# Patient Record
Sex: Male | Born: 1955 | Race: Black or African American | Hispanic: No | Marital: Married
Health system: Midwestern US, Community
[De-identification: ages and names within clinical notes are randomized; demographics above are authoritative.]

## PROBLEM LIST (undated history)

## (undated) DIAGNOSIS — C220 Liver cell carcinoma: Secondary | ICD-10-CM

## (undated) DIAGNOSIS — N400 Enlarged prostate without lower urinary tract symptoms: Secondary | ICD-10-CM

## (undated) DIAGNOSIS — Z862 Personal history of diseases of the blood and blood-forming organs and certain disorders involving the immune mechanism: Secondary | ICD-10-CM

## (undated) DIAGNOSIS — I10 Essential (primary) hypertension: Secondary | ICD-10-CM

## (undated) DIAGNOSIS — K7460 Unspecified cirrhosis of liver: Secondary | ICD-10-CM

## (undated) DIAGNOSIS — G4733 Obstructive sleep apnea (adult) (pediatric): Principal | ICD-10-CM

## (undated) DIAGNOSIS — H209 Unspecified iridocyclitis: Secondary | ICD-10-CM

## (undated) DIAGNOSIS — E785 Hyperlipidemia, unspecified: Secondary | ICD-10-CM

## (undated) DIAGNOSIS — B182 Chronic viral hepatitis C: Secondary | ICD-10-CM

## (undated) HISTORY — DX: Unspecified iridocyclitis: H20.9

## (undated) HISTORY — DX: Benign prostatic hyperplasia without lower urinary tract symptoms: N40.0

## (undated) HISTORY — DX: Chronic viral hepatitis C: B18.2

## (undated) HISTORY — DX: Unspecified cirrhosis of liver: K74.60

## (undated) HISTORY — DX: Liver cell carcinoma: C22.0

## (undated) HISTORY — DX: Obstructive sleep apnea (adult) (pediatric): G47.33

## (undated) HISTORY — DX: Essential (primary) hypertension: I10

## (undated) HISTORY — DX: Hyperlipidemia, unspecified: E78.5

## (undated) HISTORY — DX: Personal history of diseases of the blood and blood-forming organs and certain disorders involving the immune mechanism: Z86.2

---

## 1998-06-05 HISTORY — PX: OTHER SURGICAL HISTORY: SHX169

## 2005-03-21 ENCOUNTER — Ambulatory Visit: Payer: Self-pay | Admitting: Internal Medicine

## 2005-04-11 ENCOUNTER — Ambulatory Visit: Payer: Self-pay | Admitting: Internal Medicine

## 2005-06-12 ENCOUNTER — Ambulatory Visit: Payer: Self-pay | Admitting: Internal Medicine

## 2005-06-14 ENCOUNTER — Ambulatory Visit: Payer: Self-pay | Admitting: Internal Medicine

## 2005-06-22 ENCOUNTER — Ambulatory Visit: Payer: Self-pay | Admitting: Internal Medicine

## 2005-07-21 ENCOUNTER — Ambulatory Visit: Payer: Self-pay | Admitting: Internal Medicine

## 2006-03-21 ENCOUNTER — Ambulatory Visit: Payer: Self-pay | Admitting: Internal Medicine

## 2006-03-21 LAB — CONVERTED CEMR LAB
ALT: 69 units/L — ABNORMAL HIGH (ref 0–40)
Albumin: 3.1 g/dL — ABNORMAL LOW (ref 3.5–5.2)
Alkaline Phosphatase: 47 units/L (ref 39–117)
BUN: 12 mg/dL (ref 6–23)
Calcium: 9 mg/dL (ref 8.4–10.5)
Chol/HDL Ratio, serum: 4
Creatinine, Ser: 1 mg/dL (ref 0.4–1.5)
Creatinine,U: 26.8 mg/dL
GFR calc non Af Amer: 84 mL/min
Glomerular Filtration Rate, Af Am: 101 mL/min/{1.73_m2}
LDL Cholesterol: 110 mg/dL — ABNORMAL HIGH (ref 0–99)
Microalb Creat Ratio: 145.5 mg/g — ABNORMAL HIGH (ref 0.0–30.0)
Total Bilirubin: 0.6 mg/dL (ref 0.3–1.2)
Total Protein: 8 g/dL (ref 6.0–8.3)
VLDL: 21 mg/dL (ref 0–40)

## 2006-06-15 ENCOUNTER — Ambulatory Visit: Payer: Self-pay | Admitting: Internal Medicine

## 2006-06-15 LAB — CONVERTED CEMR LAB
AST: 73 units/L — ABNORMAL HIGH (ref 0–37)
Albumin: 3.4 g/dL — ABNORMAL LOW (ref 3.5–5.2)
Alkaline Phosphatase: 48 units/L (ref 39–117)
HCT: 48.7 % (ref 39.0–52.0)
Hgb A1c MFr Bld: 6.8 % — ABNORMAL HIGH (ref 4.6–6.0)
RDW: 12.7 % (ref 11.5–14.6)
Total Bilirubin: 1.2 mg/dL (ref 0.3–1.2)
WBC: 5.2 10*3/uL (ref 4.5–10.5)

## 2006-08-02 ENCOUNTER — Ambulatory Visit: Payer: Self-pay | Admitting: Internal Medicine

## 2006-08-02 LAB — CONVERTED CEMR LAB
Calcium: 9.2 mg/dL (ref 8.4–10.5)
Chloride: 98 meq/L (ref 96–112)
Creatinine, Ser: 1 mg/dL (ref 0.4–1.5)
GFR calc non Af Amer: 83 mL/min
Glucose, Bld: 99 mg/dL (ref 70–99)
Sodium: 134 meq/L — ABNORMAL LOW (ref 135–145)

## 2006-08-14 ENCOUNTER — Ambulatory Visit: Payer: Self-pay | Admitting: Gastroenterology

## 2006-08-14 ENCOUNTER — Encounter: Payer: Self-pay | Admitting: Internal Medicine

## 2006-09-26 DIAGNOSIS — E119 Type 2 diabetes mellitus without complications: Secondary | ICD-10-CM | POA: Insufficient documentation

## 2006-09-26 DIAGNOSIS — E785 Hyperlipidemia, unspecified: Secondary | ICD-10-CM | POA: Insufficient documentation

## 2006-09-26 DIAGNOSIS — M810 Age-related osteoporosis without current pathological fracture: Secondary | ICD-10-CM | POA: Insufficient documentation

## 2006-09-26 DIAGNOSIS — I10 Essential (primary) hypertension: Secondary | ICD-10-CM | POA: Insufficient documentation

## 2006-09-26 DIAGNOSIS — D869 Sarcoidosis, unspecified: Secondary | ICD-10-CM | POA: Insufficient documentation

## 2007-04-12 ENCOUNTER — Encounter (INDEPENDENT_AMBULATORY_CARE_PROVIDER_SITE_OTHER): Payer: Self-pay | Admitting: *Deleted

## 2007-04-16 ENCOUNTER — Ambulatory Visit: Payer: Self-pay | Admitting: Internal Medicine

## 2007-04-16 DIAGNOSIS — H209 Unspecified iridocyclitis: Secondary | ICD-10-CM | POA: Insufficient documentation

## 2007-04-16 DIAGNOSIS — Z87442 Personal history of urinary calculi: Secondary | ICD-10-CM | POA: Insufficient documentation

## 2007-04-16 DIAGNOSIS — B1920 Unspecified viral hepatitis C without hepatic coma: Secondary | ICD-10-CM | POA: Insufficient documentation

## 2007-04-16 DIAGNOSIS — N400 Enlarged prostate without lower urinary tract symptoms: Secondary | ICD-10-CM | POA: Insufficient documentation

## 2007-04-16 LAB — CONVERTED CEMR LAB
Blood in Urine, dipstick: NEGATIVE
Glucose, Urine, Semiquant: NEGATIVE
Nitrite: NEGATIVE
Protein, U semiquant: 100
Urobilinogen, UA: NEGATIVE
WBC Urine, dipstick: NEGATIVE
pH: 6.5

## 2007-04-19 LAB — CONVERTED CEMR LAB
ALT: 75 units/L — ABNORMAL HIGH (ref 0–53)
AST: 73 units/L — ABNORMAL HIGH (ref 0–37)
Albumin: 3.7 g/dL (ref 3.5–5.2)
Alkaline Phosphatase: 35 units/L — ABNORMAL LOW (ref 39–117)
BUN: 12 mg/dL (ref 6–23)
Basophils Absolute: 0 10*3/uL (ref 0.0–0.1)
Calcium: 9.8 mg/dL (ref 8.4–10.5)
Chloride: 99 meq/L (ref 96–112)
Creatinine,U: 101.1 mg/dL
Eosinophils Absolute: 0.1 10*3/uL (ref 0.0–0.6)
GFR calc non Af Amer: 83 mL/min
HCT: 47 % (ref 39.0–52.0)
Hgb A1c MFr Bld: 6.8 % — ABNORMAL HIGH (ref 4.6–6.0)
LDL Cholesterol: 79 mg/dL (ref 0–99)
Microalb Creat Ratio: 72.2 mg/g — ABNORMAL HIGH (ref 0.0–30.0)
Platelets: 211 10*3/uL (ref 150–400)
RBC: 5.17 M/uL (ref 4.22–5.81)
Total CHOL/HDL Ratio: 2.9
Triglycerides: 91 mg/dL (ref 0–149)
WBC: 5.5 10*3/uL (ref 4.5–10.5)

## 2007-10-24 ENCOUNTER — Telehealth: Payer: Self-pay | Admitting: Internal Medicine

## 2007-10-24 ENCOUNTER — Emergency Department (HOSPITAL_COMMUNITY): Admission: EM | Admit: 2007-10-24 | Discharge: 2007-10-24 | Payer: Self-pay | Admitting: Emergency Medicine

## 2007-11-05 ENCOUNTER — Ambulatory Visit: Payer: Self-pay | Admitting: Internal Medicine

## 2007-11-12 ENCOUNTER — Ambulatory Visit: Payer: Self-pay | Admitting: Internal Medicine

## 2007-11-14 ENCOUNTER — Encounter (INDEPENDENT_AMBULATORY_CARE_PROVIDER_SITE_OTHER): Payer: Self-pay | Admitting: *Deleted

## 2007-11-14 LAB — CONVERTED CEMR LAB
ALT: 55 units/L — ABNORMAL HIGH (ref 0–53)
AST: 54 units/L — ABNORMAL HIGH (ref 0–37)
Albumin: 3.7 g/dL (ref 3.5–5.2)
Alkaline Phosphatase: 40 units/L (ref 39–117)
Total Protein: 8.5 g/dL — ABNORMAL HIGH (ref 6.0–8.3)

## 2007-12-03 ENCOUNTER — Ambulatory Visit: Payer: Self-pay | Admitting: Internal Medicine

## 2007-12-09 LAB — CONVERTED CEMR LAB
Calcium: 9.8 mg/dL (ref 8.4–10.5)
Creatinine, Ser: 0.9 mg/dL (ref 0.4–1.5)
GFR calc Af Amer: 114 mL/min
GFR calc non Af Amer: 94 mL/min
Potassium: 4.4 meq/L (ref 3.5–5.1)

## 2007-12-19 ENCOUNTER — Encounter (INDEPENDENT_AMBULATORY_CARE_PROVIDER_SITE_OTHER): Payer: Self-pay | Admitting: *Deleted

## 2008-03-23 ENCOUNTER — Telehealth (INDEPENDENT_AMBULATORY_CARE_PROVIDER_SITE_OTHER): Payer: Self-pay | Admitting: *Deleted

## 2008-04-09 ENCOUNTER — Ambulatory Visit: Payer: Self-pay | Admitting: Internal Medicine

## 2008-05-22 ENCOUNTER — Telehealth (INDEPENDENT_AMBULATORY_CARE_PROVIDER_SITE_OTHER): Payer: Self-pay | Admitting: *Deleted

## 2008-06-01 ENCOUNTER — Encounter (INDEPENDENT_AMBULATORY_CARE_PROVIDER_SITE_OTHER): Payer: Self-pay | Admitting: *Deleted

## 2008-06-23 ENCOUNTER — Ambulatory Visit: Payer: Self-pay | Admitting: Internal Medicine

## 2008-06-25 ENCOUNTER — Telehealth (INDEPENDENT_AMBULATORY_CARE_PROVIDER_SITE_OTHER): Payer: Self-pay | Admitting: *Deleted

## 2008-06-25 LAB — CONVERTED CEMR LAB
ALT: 86 units/L — ABNORMAL HIGH (ref 0–53)
AST: 66 units/L — ABNORMAL HIGH (ref 0–37)
BUN: 15 mg/dL (ref 6–23)
Calcium: 10.2 mg/dL (ref 8.4–10.5)
Chloride: 96 meq/L (ref 96–112)
Creatinine, Ser: 1 mg/dL (ref 0.4–1.5)
GFR calc non Af Amer: 83 mL/min
Hgb A1c MFr Bld: 7.6 % — ABNORMAL HIGH (ref 4.6–6.0)
Microalb Creat Ratio: 307.3 mg/g — ABNORMAL HIGH (ref 0.0–30.0)
Microalb, Ur: 6.3 mg/dL — ABNORMAL HIGH (ref 0.0–1.9)

## 2008-07-02 ENCOUNTER — Ambulatory Visit: Payer: Self-pay | Admitting: Internal Medicine

## 2008-07-02 ENCOUNTER — Encounter: Payer: Self-pay | Admitting: Internal Medicine

## 2008-07-21 ENCOUNTER — Telehealth (INDEPENDENT_AMBULATORY_CARE_PROVIDER_SITE_OTHER): Payer: Self-pay | Admitting: *Deleted

## 2008-07-30 ENCOUNTER — Encounter: Payer: Self-pay | Admitting: Internal Medicine

## 2008-08-06 ENCOUNTER — Encounter: Payer: Self-pay | Admitting: Internal Medicine

## 2008-09-29 ENCOUNTER — Ambulatory Visit: Payer: Self-pay | Admitting: Internal Medicine

## 2008-10-02 ENCOUNTER — Telehealth (INDEPENDENT_AMBULATORY_CARE_PROVIDER_SITE_OTHER): Payer: Self-pay | Admitting: *Deleted

## 2008-10-02 LAB — CONVERTED CEMR LAB
BUN: 17 mg/dL (ref 6–23)
Basophils Absolute: 0 10*3/uL (ref 0.0–0.1)
Chloride: 104 meq/L (ref 96–112)
Eosinophils Absolute: 0 10*3/uL (ref 0.0–0.7)
HCT: 46.4 % (ref 39.0–52.0)
Hemoglobin: 15.8 g/dL (ref 13.0–17.0)
Hgb A1c MFr Bld: 6.7 % — ABNORMAL HIGH (ref 4.6–6.5)
Lymphocytes Relative: 21.9 % (ref 12.0–46.0)
MCHC: 34.1 g/dL (ref 30.0–36.0)
Microalb Creat Ratio: 512.8 mg/g — ABNORMAL HIGH (ref 0.0–30.0)
Neutro Abs: 3.3 10*3/uL (ref 1.4–7.7)
Neutrophils Relative %: 67.2 % (ref 43.0–77.0)
Platelets: 192 10*3/uL (ref 150.0–400.0)
Potassium: 4.2 meq/L (ref 3.5–5.1)
RDW: 13.5 % (ref 11.5–14.6)
TSH: 1.14 microintl units/mL (ref 0.35–5.50)
Total CHOL/HDL Ratio: 4
Triglycerides: 73 mg/dL (ref 0.0–149.0)
WBC: 4.9 10*3/uL (ref 4.5–10.5)

## 2008-10-07 ENCOUNTER — Ambulatory Visit: Payer: Self-pay | Admitting: Gastroenterology

## 2008-10-09 ENCOUNTER — Encounter (INDEPENDENT_AMBULATORY_CARE_PROVIDER_SITE_OTHER): Payer: Self-pay | Admitting: *Deleted

## 2008-10-16 ENCOUNTER — Ambulatory Visit: Payer: Self-pay | Admitting: Gastroenterology

## 2008-10-16 LAB — HM COLONOSCOPY

## 2008-11-18 ENCOUNTER — Encounter: Payer: Self-pay | Admitting: Internal Medicine

## 2009-02-04 ENCOUNTER — Telehealth (INDEPENDENT_AMBULATORY_CARE_PROVIDER_SITE_OTHER): Payer: Self-pay | Admitting: *Deleted

## 2009-03-09 ENCOUNTER — Ambulatory Visit: Payer: Self-pay | Admitting: Internal Medicine

## 2009-03-12 ENCOUNTER — Ambulatory Visit: Payer: Self-pay | Admitting: Internal Medicine

## 2009-03-12 LAB — HM DIABETES FOOT EXAM

## 2009-03-16 ENCOUNTER — Encounter: Admission: RE | Admit: 2009-03-16 | Discharge: 2009-03-16 | Payer: Self-pay | Admitting: Internal Medicine

## 2009-03-16 ENCOUNTER — Encounter: Payer: Self-pay | Admitting: Internal Medicine

## 2009-04-02 ENCOUNTER — Ambulatory Visit: Payer: Self-pay | Admitting: Internal Medicine

## 2009-04-05 LAB — CONVERTED CEMR LAB
BUN: 24 mg/dL — ABNORMAL HIGH (ref 6–23)
Calcium: 9 mg/dL (ref 8.4–10.5)
Chloride: 99 meq/L (ref 96–112)
Creatinine, Ser: 1.1 mg/dL (ref 0.4–1.5)
GFR calc non Af Amer: 89.41 mL/min (ref >60)
Hgb A1c MFr Bld: 6.5 % (ref 4.6–6.5)
Microalb, Ur: 11.3 mg/dL — ABNORMAL HIGH (ref 0.0–1.9)

## 2009-04-09 ENCOUNTER — Encounter: Payer: Self-pay | Admitting: Internal Medicine

## 2009-04-22 ENCOUNTER — Encounter: Payer: Self-pay | Admitting: Internal Medicine

## 2009-04-22 ENCOUNTER — Ambulatory Visit: Payer: Self-pay | Admitting: Gastroenterology

## 2009-06-14 ENCOUNTER — Ambulatory Visit (HOSPITAL_COMMUNITY): Admission: RE | Admit: 2009-06-14 | Discharge: 2009-06-14 | Payer: Self-pay | Admitting: Gastroenterology

## 2009-07-02 ENCOUNTER — Ambulatory Visit: Payer: Self-pay | Admitting: Internal Medicine

## 2009-07-05 LAB — CONVERTED CEMR LAB
BUN: 25 mg/dL — ABNORMAL HIGH (ref 6–23)
CO2: 29 meq/L (ref 19–32)
Chloride: 102 meq/L (ref 96–112)
Potassium: 4.3 meq/L (ref 3.5–5.1)

## 2009-11-04 ENCOUNTER — Ambulatory Visit: Payer: Self-pay | Admitting: Internal Medicine

## 2009-11-04 ENCOUNTER — Encounter (INDEPENDENT_AMBULATORY_CARE_PROVIDER_SITE_OTHER): Payer: Self-pay | Admitting: *Deleted

## 2009-12-01 ENCOUNTER — Ambulatory Visit: Payer: Self-pay | Admitting: Internal Medicine

## 2009-12-03 ENCOUNTER — Ambulatory Visit: Payer: Self-pay | Admitting: Internal Medicine

## 2009-12-09 LAB — CONVERTED CEMR LAB
BUN: 18 mg/dL (ref 6–23)
Basophils Relative: 0.6 % (ref 0.0–3.0)
CO2: 30 meq/L (ref 19–32)
Chloride: 105 meq/L (ref 96–112)
Cholesterol: 158 mg/dL (ref 0–200)
Creatinine, Ser: 0.9 mg/dL (ref 0.4–1.5)
Glucose, Bld: 127 mg/dL — ABNORMAL HIGH (ref 70–99)
HCT: 42.5 % (ref 39.0–52.0)
HDL: 49.8 mg/dL (ref >39.00)
Hemoglobin: 14.5 g/dL (ref 13.0–17.0)
Lymphs Abs: 1.3 10*3/uL (ref 0.7–4.0)
MCHC: 34.2 g/dL (ref 30.0–36.0)
Monocytes Relative: 10.2 % (ref 3.0–12.0)
Neutro Abs: 3.3 10*3/uL (ref 1.4–7.7)
RBC: 4.72 M/uL (ref 4.22–5.81)
RDW: 13.4 % (ref 11.5–14.6)
TSH: 1.23 microintl units/mL (ref 0.35–5.50)
VLDL: 18.8 mg/dL (ref 0.0–40.0)

## 2010-04-05 ENCOUNTER — Ambulatory Visit: Payer: Self-pay | Admitting: Internal Medicine

## 2010-07-05 NOTE — Assessment & Plan Note (Signed)
Summary: CPX /cbs   Vital Signs:  Patient profile:   55 year old male Height:      68 inches Weight:      160.50 pounds Pulse rate:   76 / minute Pulse rhythm:   regular BP sitting:   138 / 82 Cuff size:   large  Vitals Entered By: Army Fossa CMA (December 01, 2009 3:41 PM) CC: Pt states he is here for a CPX,   History of Present Illness: CPX occasionally fatiged  still some ED  Preventive Screening-Counseling & Management  Caffeine-Diet-Exercise     Does Patient Exercise: yes     Times/week: 3  Allergies: 1)  Erythromycin Ethylsuccinate  Past History:  Past Medical History: h/o sarcoidosis, used steroids x 10 years  Diabetes mellitus, type II,  initially 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 but then the diabetes resurface without the use of steroids Hyperlipidemia Hypertension Osteoporosis, 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 Prostate ca-- brother  MI-- brother (pass away) Strokes--mo colon ca--no lung cancer-- father no sarcoid  Social History: mother-- Loann Quill one of my pts Married no children Patient states he is a  former smoker. Quit in 1992.  He smoked for 10-12 years 1/2-1ppd. ETOH-- socially  diet-- ok most of the time  exercise--  regulalrly   Occupation: retired Theatre manager Patient Exercise:  yes  Review of Systems CV:  Denies chest pain or discomfort and swelling of feet. Resp:  Denies cough, shortness of breath, and wheezing. GI:  Denies bloody stools, diarrhea, nausea, and vomiting. GU:  Denies dysuria, hematuria, urinary frequency, and urinary hesitancy; some frecuency , drinks lots of fluids . Psych:  Denies anxiety and depression.  Physical Exam  General:  alert, well-developed,  and well-nourished.   Neck:  no thyromegaly and normal carotid upstroke.   Lungs:  normal respiratory effort, no intercostal retractions, no accessory muscle use, and normal breath sounds.   Heart:  normal rate, regular rhythm, and no murmur.   Abdomen:  soft, non-tender, no distention, no masses, no guarding, and no rigidity.   Rectal:  No external abnormalities noted. Normal sphincter tone. No rectal masses or tenderness. Prostate:  Prostate gland firm and smooth, no nodularity, tenderness, mass, asymmetry or induration. mild prostate enlargement noted Extremities:  no lower extremity edema Psych:  Cognition and judgment appear intact. Alert and cooperative with normal attention span and concentration.  not anxious appearing and not depressed appearing.     Impression & Recommendations:  Problem # 1:  HEALTH SCREENING (ICD-V70.0) Td 2004 pneumonia shot 2007 Cscope 5-10, diverticuli, next in 10 years continue healthy lifestyle, labs  Problem # 2:  HYPERTENSION (ICD-401.9) at goal  His updated medication list for this problem includes:    Benazepril Hcl 20 Mg Tabs (Benazepril hcl) .Marland Kitchen... 1 by mouth once daily    Amlodipine Besylate 5 Mg Tabs (Amlodipine besylate) ..... One by mouth daily- no further refills until office visit  BP today: 138/82 Prior BP: 120/80 (07/02/2009)  Labs Reviewed: K+: 4.3 (07/02/2009) Creat: : 1.2 (07/02/2009)   Chol: 178 (09/29/2008)   HDL: 50.20 (09/29/2008)   LDL: 113 (09/29/2008)   TG: 73.0 (09/29/2008)  Problem # 3:  OSTEOPOROSIS (ICD-733.00) last DEXA normal 06-2008    Problem # 4:  HYPERLIPIDEMIA (ICD-272.4) labs  Labs Reviewed: SGOT: 66 (06/23/2008)   SGPT: 86 (06/23/2008)   HDL:50.20 (09/29/2008), 51.1 (04/16/2007)  LDL:113 (09/29/2008), 79 (16/03/9603)  Chol:178 (09/29/2008), 148 (04/16/2007)  Trig:73.0 (09/29/2008), 91 (04/16/2007)  Complete Medication List: 1)  Flonase 50 Mcg/act Susp (Fluticasone propionate) .... Spray 2 spray into both  nostrils once a day 2)  Glimepiride 2 Mg Tabs (Glimepiride) .Marland Kitchen.. 1 by mouth qd 3)  Adult Aspirin Low Strength 81 Mg Tbdp (Aspirin) .... Once daily 4)  Allegra 180 Mg Tabs (Fexofenadine hcl) .Marland Kitchen.. 1 by mouth qd 5)  Benazepril Hcl 20 Mg Tabs (Benazepril hcl) .Marland Kitchen.. 1 by mouth once daily 6)  Amlodipine Besylate 5 Mg Tabs (Amlodipine besylate) .... One by mouth daily- no further refills until office visit  Patient Instructions: 1)  please come back fasting 2)  FLP, AST, ALT, CBC, TSH, BMP, vitamin D, PSA ----dx v70 3)  Hemoglobin A1c---- dx DM 4)  Please schedule a follow-up appointment in 4 to 5 months .    Risk Factors:  Exercise:  yes    Times per week:  3

## 2010-07-05 NOTE — Assessment & Plan Note (Signed)
Summary: FOLLOWUP. FLU SHOT///SPH   Vital Signs:  Patient profile:   55 year old male Height:      68 inches Weight:      167 pounds BMI:     25.48 Pulse rate:   58 / minute Pulse rhythm:   regular BP sitting:   132 / 82  (left arm) Cuff size:   large  Vitals Entered By: Army Fossa CMA (April 05, 2010 9:55 AM) CC: follow up visit- fasting  Comments flu shot  [facility]   History of Present Illness: ROV feeling well   ROS occasionally feels tired x 2 - 3 days at a time , no associated shortness of breath fevers or myalgias. has cough from time to time, no persistent, nonproductive CBGs mostly wnl, noted CBGs go up depending on diet and wt       Current Medications (verified): 1)  Flonase 50 Mcg/act Susp (Fluticasone Propionate) .... Spray 2 Spray Into Both Nostrils Once A Day 2)  Glimepiride 2 Mg Tabs (Glimepiride) .Marland Kitchen.. 1 By Mouth Qd 3)  Adult Aspirin Low Strength 81 Mg  Tbdp (Aspirin) .... Once Daily 4)  Allegra 180 Mg  Tabs (Fexofenadine Hcl) .Marland Kitchen.. 1 By Mouth Qd 5)  Benazepril Hcl 20 Mg Tabs (Benazepril Hcl) .Marland Kitchen.. 1 By Mouth Once Daily 6)  Amlodipine Besylate 5 Mg Tabs (Amlodipine Besylate) .... 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 mother-- Loann Quill one of my pts Married no children Patient states he is a  former smoker. Quit in 1992.   He smoked for 10-12 years 1/2-1ppd. ETOH-- socially  diet-- ok most of the time  exercise--  regulalrly   Occupation: retired Hotel manager  Physical Exam  General:  alert and well-developed.   Lungs:  normal respiratory effort, no intercostal retractions, no accessory muscle use, and normal breath sounds.   Heart:  normal rate, regular rhythm, and no murmur.   Extremities:  no lower extremity edema Psych:  Oriented X3, memory intact for recent and remote, normally interactive, good eye contact, not anxious appearing, and not depressed appearing.     Impression & Recommendations:  Problem # 1:  HYPERTENSION (ICD-401.9) at goal  His updated medication list for this problem includes:    Benazepril Hcl 20 Mg Tabs (Benazepril hcl) .Marland Kitchen... 1 by mouth once daily    Amlodipine Besylate 5 Mg Tabs (Amlodipine besylate) ..... One by mouth daily- no further refills until office visit  BP today: 132/82 Prior BP: 138/82 (12/01/2009)  Labs Reviewed: K+: 4.3 (12/03/2009) Creat: : 0.9 (12/03/2009)   Chol: 158 (12/03/2009)   HDL: 49.80 (12/03/2009)   LDL: 89 (12/03/2009)   TG: 94.0 (12/03/2009)  Problem # 2:  DIABETES MELLITUS, TYPE II (ICD-250.00) Well  controlled, discussed with patient the need to have a healthy diet and exercise particularly in the next few weeks ( holiday season) His updated medication list for this problem includes:    Glimepiride 2 Mg Tabs (  Glimepiride) .Marland Kitchen... 1 by mouth qd    Adult Aspirin Low Strength 81 Mg Tbdp (Aspirin) ..... Once daily    Benazepril Hcl 20 Mg Tabs (Benazepril hcl) .Marland Kitchen... 1 by mouth once daily  Orders: Venipuncture ([address]) TLB-A1C / Hgb A1C (Glycohemoglobin) (83036-A1C)  Labs Reviewed: Creat: 0.9 (12/03/2009)    Reviewed HgBA1c results: 6.7 (12/03/2009)  6.4 (07/02/2009)  Complete Medication List: 1)  Flonase 50 Mcg/act Susp (Fluticasone propionate) .... Spray 2 spray into both nostrils once a day 2)  Glimepiride 2 Mg Tabs (Glimepiride) .Marland Kitchen.. 1 by  mouth qd 3)  Adult Aspirin Low Strength 81 Mg Tbdp (Aspirin) .... Once daily 4)  Allegra 180 Mg Tabs (Fexofenadine hcl) .Marland Kitchen.. 1 by mouth qd 5)  Benazepril Hcl 20 Mg Tabs (Benazepril hcl) .Marland Kitchen.. 1 by mouth once daily 6)  Amlodipine Besylate 5 Mg Tabs (Amlodipine besylate) .... One by mouth daily- no further refills until office visit  Other Orders: Flu Vaccine 31yrs + MEDICARE PATIENTS (U0454) Administration Flu vaccine - MCR (U9811)  Patient Instructions: 1)  Please schedule a follow-up appointment in 4 months .  Prescriptions: GLIMEPIRIDE 2 MG TABS (GLIMEPIRIDE) 1 by mouth qd  #90 x 3   Entered by:   Army Fossa CMA   Authorized by:   Nolon Rod. Paz MD   Signed by:   Army Fossa CMA on 04/05/2010   Method used:   Electronically to        [facility] (retail)       [address]       [place], [place]  [address]       Ph: [phone]       Fax: [phone]   RxID:   [id number]   Orders Added: 1)  Flu Vaccine 80yrs + MEDICARE PATIENTS [Q2039] 2)  Administration Flu vaccine - MCR [G0008] 3)  Venipuncture [36415] 4)  TLB-A1C / Hgb A1C (Glycohemoglobin) [83036-A1C] 5)  Est. Patient Level III [66440] Flu Vaccine Consent Questions     Do you have a history of severe allergic reactions to this vaccine? no    Any prior history of allergic reactions to egg and/or gelatin? no    Do you have a sensitivity to the preservative Thimersol? no    Do you have a past history of Guillan-Barre Syndrome? no    Do you currently have an acute febrile illness? no    Have you ever had a severe reaction to latex? no    Vaccine information given and explained to patient? yes    Are you currently pregnant? no    Lot Number:[id number]   Exp Date:12/03/2010   Site Given  right Deltoid IMcine 27yrs + MEDICARE PATIENTS [Q2039] 2)  Administration Flu vaccine - MCR [G0008]     .lbmedflu

## 2010-07-05 NOTE — Assessment & Plan Note (Signed)
Summary: 3 MONTH FOLLOWUP///SPH   Vital Signs:  Patient profile:   55 year old male Height:      68 inches Weight:      161.8 pounds BMI:     24.69 Pulse rate:   70 / minute BP sitting:   120 / 80  Vitals Entered By: Shary Decamp (July 02, 2009 9:03 AM) CC: rov, not fasting Is Patient Diabetic? Yes Comments  - FBS this am 200, FBS yest 120 (pt states that his blood sugars have been elevated lately, feels ok, just a little tired)  - BP yest 111/71 Shary Decamp  July 02, 2009 9:10 AM    History of Present Illness: h/o sarcoidosis-- had a flu shot already   Diabetes mellitus-- ambulatory CBGs varies from 120-200 w/o apparent reason  Hyperlipidemia-- saw a nutritionist, doing better w/ his diet!  Hypertension-- ambulatory BPs usualy wnl , now on amlodipine and benazepril 20qd    HEP C--  had a liver  Bx , patient unaware of results so far       Current Medications (verified): 1)  Flonase 50 Mcg/act Susp (Fluticasone Propionate) .... Spray 2 Spray Into Both Nostrils Once A Day 2)  Glimepiride 2 Mg Tabs (Glimepiride) .Marland Kitchen.. 1 By Mouth Qd 3)  Adult Aspirin Low Strength 81 Mg  Tbdp (Aspirin) .... Once Daily 4)  Allegra 180 Mg  Tabs (Fexofenadine Hcl) .Marland Kitchen.. 1 By Mouth Qd 5)  Benazepril Hcl 20 Mg Tabs (Benazepril Hcl) .Marland Kitchen.. 1 By Mouth Once Daily 6)  Amlodipine Besylate 5 Mg Tabs (Amlodipine Besylate) .... 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 mother-- Loann Quill one of my pts Married no children Patient states he is a  former smoker. Quit in 1992.  He smoked for 10-12 years 1/2-1ppd. Occupation: retired Hotel manager  Review of Systems CV:  Denies chest pain or discomfort and swelling of feet. Resp:  Denies coughing up blood and shortness of breath. GI:  Denies abdominal pain, nausea, and vomiting. Endo:  no low cbgs , lowest has been 80.  Physical Exam  General:  alert.  alert, well-developed, and well-nourished.   Lungs:  normal respiratory effort, no intercostal retractions, no accessory muscle use, and normal breath sounds.   Heart:  normal rate, regular rhythm, and no murmur.   Abdomen:  soft, non-tender, no distention, and no masses.   Psych:  Cognition and judgment appear intact. Alert and cooperative with normal attention span and concentration.  not anxious appearing and not depressed appearing.     Impression & Recommendations:  Problem # 1:  DIABETES MELLITUS, TYPE II (ICD-250.00) doing very well as far diet and exercise,has lost 13 pounds since last year, no hypoglycemia symptoms    His updated medication list for this problem includes:    Glimepiride 2 Mg Tabs (Glimepiride) .Marland Kitchen... 1 by mouth qd    Adult Aspirin Low Strength 81 Mg Tbdp (Aspirin) ..... Once daily    Benazepril Hcl 20 Mg Tabs (Benazepril hcl) .Marland Kitchen... 1 by mouth once daily  Orders: Venipuncture ([address]) TLB-A1C /  Hgb A1C (Glycohemoglobin) (83036-A1C)  Problem # 2:  HYPERLIPIDEMIA (ICD-272.4) last LDL  more than 100, anticipate that his numbers will  be better in the future due to his better lifestyle Labs Reviewed: SGOT: 66 (06/23/2008)   SGPT: 86 (06/23/2008)   HDL:50.20 (09/29/2008), 51.1 (04/16/2007)  LDL:113 (09/29/2008), 79 (04/01/2535)  Chol:178 (09/29/2008), 148 (04/16/2007)  Trig:73.0 (09/29/2008), 91 (04/16/2007)  Problem # 3:  HYPERTENSION (ICD-401.9) will control with current regimen His updated medication list for this  problem includes:    Benazepril Hcl 20 Mg Tabs (Benazepril hcl) .Marland Kitchen... 1 by mouth once daily    Amlodipine Besylate 5 Mg Tabs (Amlodipine besylate) ..... One by mouth daily    BP today: 120/80 Prior BP: 100/60 (04/02/2009)  Labs Reviewed: K+: 4.4 (04/02/2009) Creat: : 1.1 (04/02/2009)   Chol: 178 (09/29/2008)   HDL: 50.20 (09/29/2008)   LDL: 113 (09/29/2008)   TG: 73.0 (09/29/2008)  Orders: TLB-BMP (Basic Metabolic Panel-BMET) (80048-METABOL)  Complete Medication List: 1)  Flonase 50 Mcg/act Susp (Fluticasone propionate) .... Spray 2 spray into both nostrils once a day 2)  Glimepiride 2 Mg Tabs (Glimepiride) .Marland Kitchen.. 1 by mouth qd 3)  Adult Aspirin Low Strength 81 Mg Tbdp (Aspirin) .... Once daily 4)  Allegra 180 Mg Tabs (Fexofenadine hcl) .Marland Kitchen.. 1 by mouth qd 5)  Benazepril Hcl 20 Mg Tabs (Benazepril hcl) .Marland Kitchen.. 1 by mouth once daily 6)  Amlodipine Besylate 5 Mg Tabs (Amlodipine besylate) .... One by mouth daily  Patient Instructions: 1)  Please schedule a follow-up appointment in 4 months, fasting, yearly checkup

## 2010-07-05 NOTE — Letter (Signed)
Summary: [place] No Show Letter  [facility] [place]  [address] [place], [place] [address]   Phone: [phone]  Fax: [phone]    11/04/2009 MRN: [id number]  Jeffery York [address] [place], [place]  [address]   Dear [name],   Our records indicate that you missed your scheduled appointment with __Dr.Paz on _6-2-11.  Please contact this office to reschedule your appointment as soon as possible.  It is important that you keep your scheduled appointments with your physician, so we can provide you the best care possible.  Please be advised that there may be a charge for "no show" appointments.    Sincerely,    at [facility]

## 2010-08-21 LAB — CBC
Platelets: 211 10*3/uL (ref 150–400)
WBC: 5.7 10*3/uL (ref 4.0–10.5)

## 2010-08-21 LAB — PROTIME-INR: INR: 0.99 (ref 0.00–1.49)

## 2010-08-21 LAB — APTT: aPTT: 34 seconds (ref 24–37)

## 2010-09-13 LAB — GLUCOSE, CAPILLARY: Glucose-Capillary: 176 mg/dL — ABNORMAL HIGH (ref 70–99)

## 2010-10-07 ENCOUNTER — Other Ambulatory Visit: Payer: Self-pay | Admitting: *Deleted

## 2010-10-07 MED ORDER — AMLODIPINE BESYLATE 5 MG PO TABS: 5.0000 mg | ORAL_TABLET | Freq: Every day | ORAL | Status: DC

## 2010-11-04 ENCOUNTER — Other Ambulatory Visit: Payer: Self-pay | Admitting: Internal Medicine

## 2010-12-03 ENCOUNTER — Other Ambulatory Visit: Payer: Self-pay | Admitting: Internal Medicine

## 2010-12-30 ENCOUNTER — Encounter: Payer: Self-pay | Admitting: Internal Medicine

## 2011-01-12 ENCOUNTER — Ambulatory Visit (INDEPENDENT_AMBULATORY_CARE_PROVIDER_SITE_OTHER): Admitting: Internal Medicine

## 2011-01-12 ENCOUNTER — Encounter: Payer: Self-pay | Admitting: Internal Medicine

## 2011-01-12 DIAGNOSIS — M810 Age-related osteoporosis without current pathological fracture: Secondary | ICD-10-CM

## 2011-01-12 DIAGNOSIS — Z0000 Encounter for general adult medical examination without abnormal findings: Secondary | ICD-10-CM

## 2011-01-12 DIAGNOSIS — B1710 Acute hepatitis C without hepatic coma: Secondary | ICD-10-CM

## 2011-01-12 DIAGNOSIS — E119 Type 2 diabetes mellitus without complications: Secondary | ICD-10-CM

## 2011-01-12 LAB — COMPREHENSIVE METABOLIC PANEL
ALT: 75 U/L — ABNORMAL HIGH (ref 0–53)
AST: 58 U/L — ABNORMAL HIGH (ref 0–37)
Alkaline Phosphatase: 57 U/L (ref 39–117)
CO2: 30 mEq/L (ref 19–32)
Sodium: 135 mEq/L (ref 135–145)
Total Bilirubin: 1 mg/dL (ref 0.3–1.2)
Total Protein: 8.9 g/dL — ABNORMAL HIGH (ref 6.0–8.3)

## 2011-01-12 LAB — CBC WITH DIFFERENTIAL/PLATELET
Basophils Absolute: 0 10*3/uL (ref 0.0–0.1)
Eosinophils Absolute: 0.1 10*3/uL (ref 0.0–0.7)
HCT: 45.4 % (ref 39.0–52.0)
Lymphs Abs: 1.4 10*3/uL (ref 0.7–4.0)
MCHC: 33.6 g/dL (ref 30.0–36.0)
MCV: 89.5 fl (ref 78.0–100.0)
Monocytes Absolute: 0.6 10*3/uL (ref 0.1–1.0)
Monocytes Relative: 9.5 % (ref 3.0–12.0)
Neutro Abs: 3.9 10*3/uL (ref 1.4–7.7)
Platelets: 225 10*3/uL (ref 150.0–400.0)
RDW: 13.6 % (ref 11.5–14.6)

## 2011-01-12 LAB — LIPID PANEL
Cholesterol: 226 mg/dL — ABNORMAL HIGH (ref 0–200)
HDL: 58 mg/dL (ref >39.00)
VLDL: 25 mg/dL (ref 0.0–40.0)

## 2011-01-12 LAB — PSA: PSA: 2.55 ng/mL (ref 0.10–4.00)

## 2011-01-12 MED ORDER — AMLODIPINE BESYLATE 5 MG PO TABS: 5.0000 mg | ORAL_TABLET | Freq: Every day | ORAL | Status: DC

## 2011-01-12 MED ORDER — GLIMEPIRIDE 2 MG PO TABS: 2.0000 mg | ORAL_TABLET | Freq: Every day | ORAL | Status: DC

## 2011-01-12 MED ORDER — BENAZEPRIL HCL 20 MG PO TABS: 20.0000 mg | ORAL_TABLET | Freq: Every day | ORAL | Status: DC

## 2011-01-12 NOTE — Assessment & Plan Note (Addendum)
Td 2004 pneumonia shot 2007 Cscope 5-10, diverticuli, next in 10 years Encouraged to go back to his healthier lifestyle. Labs Paresthesia , R leg--observation, call if sx increase or persist

## 2011-01-12 NOTE — Assessment & Plan Note (Signed)
Encouraged to see the hep C clinic regulalrly

## 2011-01-12 NOTE — Patient Instructions (Signed)
Diet and exercise! Take calcium 1 g  And  vitamin D [phone]  units daily.

## 2011-01-12 NOTE — Assessment & Plan Note (Signed)
At some point he took the Fosamax, currently taking no medicines. Last bone density test was 06-2008: Normal. Recommend calcium and vitamin D.

## 2011-01-12 NOTE — Progress Notes (Signed)
  Subjective:    Patient ID: Jeffery York, male    DOB: June 15, 1955, 55 y.o.   MRN: [id number]  HPI Complete physical exam, doing well.  Past Medical History  Diagnosis Date  . 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 Hyperlipidemia   . Hypertension   . History of sarcoidosis     used steroids x 10 years  . 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 Hep C w/o coma, chronic     bx aprox 12-10, f/u by the Hep C clinic  . Benign prostatic hypertrophy   . Uveitis     h/o      Past Surgical History  Procedure Date  . Lung resection aspergilloma 2000    Family History: Prostate ca-- brother  MI-- brother (pass away) at age 75 Strokes--mo colon ca--no lung cancer-- father no sarcoid  Social History: mother-- Loann Quill one of my pts Married no children Patient states he is a  former smoker. Quit in 1992.  He smoked for 10-12 years 1/2-1ppd. ETOH-- socially  diet-- used to be better, has gained wt exercise--  Used to do better    Review of Systems Good compliance with medicines 4 few months has noted a on and off burning at the most proximal pretibial area on the right. Symptoms increased with bending the knees or squatting. No rash or redness. No chest pain or shortness of breath No nausea, vomiting, diarrhea or blood in the stools No difficulty urinating, no gross hematuria. No anxiety or depression.     Objective:   Physical Exam  Constitutional: He is oriented to person, place, and time. He appears well-developed and well-nourished.  HENT:  Head: Normocephalic and atraumatic.  Neck: No thyromegaly present.  Cardiovascular: Normal rate, regular rhythm and normal heart sounds.   No murmur heard. Pulmonary/Chest: Effort normal and breath sounds normal. No respiratory distress. He has no wheezes. He has no rales.  Abdominal: Soft. Bowel sounds are normal. He exhibits no distension. There  is no tenderness. There is no rebound and no guarding.  Genitourinary: Rectum normal and prostate normal.  Musculoskeletal: He exhibits no edema.       Inspection  & palpation of the knees and pretibial area normal  Neurological: He is alert and oriented to person, place, and time.       Speech, gait and motor strength symmetric. DTRs symmetrically decreased, more noticeable in the lower extremities.  Psychiatric: He has a normal mood and affect. His behavior is normal. Judgment and thought content normal.          Assessment & Plan:

## 2011-03-01 LAB — POCT I-STAT, CHEM 8
BUN: 20
Chloride: 101
HCT: 48
Potassium: 4.6

## 2011-03-01 LAB — HEPATIC FUNCTION PANEL
ALT: 56 — ABNORMAL HIGH
AST: 57 — ABNORMAL HIGH
Alkaline Phosphatase: 50
Total Protein: 7.9

## 2011-03-01 LAB — DIFFERENTIAL
Basophils Absolute: 0
Basophils Relative: 0
Eosinophils Relative: 1
Monocytes Absolute: 0.5

## 2011-03-01 LAB — CBC
HCT: 45.2
Hemoglobin: 15.2
MCHC: 33.6
MCV: 89.6
RDW: 13

## 2011-03-01 LAB — APTT: aPTT: 35

## 2011-05-05 ENCOUNTER — Encounter: Payer: Self-pay | Admitting: Internal Medicine

## 2011-05-05 ENCOUNTER — Ambulatory Visit (INDEPENDENT_AMBULATORY_CARE_PROVIDER_SITE_OTHER): Admitting: Internal Medicine

## 2011-05-05 DIAGNOSIS — I10 Essential (primary) hypertension: Secondary | ICD-10-CM

## 2011-05-05 DIAGNOSIS — E119 Type 2 diabetes mellitus without complications: Secondary | ICD-10-CM

## 2011-05-05 DIAGNOSIS — Z23 Encounter for immunization: Secondary | ICD-10-CM

## 2011-05-05 DIAGNOSIS — E785 Hyperlipidemia, unspecified: Secondary | ICD-10-CM

## 2011-05-05 LAB — HEMOGLOBIN A1C: Hgb A1c MFr Bld: 6.6 % — ABNORMAL HIGH (ref 4.6–6.5)

## 2011-05-05 MED ORDER — GLIMEPIRIDE 4 MG PO TABS: 4.0000 mg | ORAL_TABLET | Freq: Every day | ORAL | Status: DC

## 2011-05-05 NOTE — Assessment & Plan Note (Signed)
Well controlled 

## 2011-05-05 NOTE — Patient Instructions (Signed)
Take glimepiride before the biggest meal of the day (dinner )  Watch for low sugars , if that is the case please call Came back in 3 months

## 2011-05-05 NOTE — Assessment & Plan Note (Addendum)
Not well controlled, unfortunately he was not called with the instructions base on last a1c Plan: labs Change  amaryl to 4mg  1 po qd, to be taken w/ dinner , his largest meal of the day, watch for low CBGs (we also agreed to try 2 mg first to be sure he doesn't get hypoglycemic at night )

## 2011-05-05 NOTE — Progress Notes (Signed)
  Subjective:    Patient ID: Jeffery York, male    DOB: [birth year], 55 y.o.   MRN: [id number]  HPI ROV  Past Medical History  Diagnosis Date  . 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 Hyperlipidemia   . Hypertension   . History of sarcoidosis     used steroids x 10 years  . 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 Hep C w/o coma, chronic     bx aprox 12-10, f/u by the Hep C clinic  . Benign prostatic hypertrophy   . Uveitis     h/o    Review of Systems CBGs in AM increasing to 150s amaryl dose was not increased to 4 mg as recommended base on last a1c (nobody called him), he takes it first thing in AM Eats very little at breakfast, lunch, heaviest meal is supper , occ CBG at 4 pm is in the 60-70 and he feels weak    Objective:   Physical Exam  AOx3 NAD     Assessment & Plan:  Today , I spent more than 15  min with the patient, >50% of the time counseling regards DM

## 2011-05-05 NOTE — Assessment & Plan Note (Signed)
Last FLP discussed, reassess once DM better

## 2011-07-18 ENCOUNTER — Ambulatory Visit (INDEPENDENT_AMBULATORY_CARE_PROVIDER_SITE_OTHER): Admitting: Internal Medicine

## 2011-07-18 VITALS — BP 150/84 | HR 64 | Temp 98.3°F | Wt 174.0 lb

## 2011-07-18 DIAGNOSIS — I10 Essential (primary) hypertension: Secondary | ICD-10-CM

## 2011-07-18 DIAGNOSIS — N529 Male erectile dysfunction, unspecified: Secondary | ICD-10-CM | POA: Diagnosis not present

## 2011-07-18 DIAGNOSIS — E119 Type 2 diabetes mellitus without complications: Secondary | ICD-10-CM

## 2011-07-18 LAB — BASIC METABOLIC PANEL
Chloride: 99 mEq/L (ref 96–112)
Potassium: 4.1 mEq/L (ref 3.5–5.1)
Sodium: 137 mEq/L (ref 135–145)

## 2011-07-18 MED ORDER — TADALAFIL 20 MG PO TABS: 10.0000 mg | ORAL_TABLET | ORAL | Status: DC | PRN

## 2011-07-18 MED ORDER — GLIMEPIRIDE 2 MG PO TABS: ORAL_TABLET | ORAL | Status: DC

## 2011-07-18 MED ORDER — GLIMEPIRIDE 2 MG PO TABS: 3.0000 mg | ORAL_TABLET | Freq: Every day | ORAL | Status: DC

## 2011-07-18 NOTE — Assessment & Plan Note (Signed)
BP elevated today, usually ok, no amb BPs Plan-- no change, monitor BPs, see instructions

## 2011-07-18 NOTE — Patient Instructions (Addendum)
Call for a refill on cialis if needed  Check the  blood pressure 2 or 3 times a week, be sure it is less than 140/85. If it is consistently higher, let me know

## 2011-07-18 NOTE — Assessment & Plan Note (Signed)
New problem, normal vascular exam; multiple questions answer. Trial w/ cialis, how to use it and s/e discussed

## 2011-07-18 NOTE — Assessment & Plan Note (Addendum)
Seems to be well controlled w/ amaryl 4 mg in the morning and 3 mg in the afternoon thus will  change Rx

## 2011-07-18 NOTE — Progress Notes (Signed)
  Subjective:    Patient ID: Jeffery York, male    DOB: [birth year], 56 y.o.   MRN: [id number]  HPI Routine office visit Diabetes, he takes 4 mg of glyburide in the morning and also was taking 4 mg of Amaryl with dinner but he experiences low blood sugars, he cut down the dose of Amaryl to 3 mg at dinnertime and since then his blood sugars are between 100-125. Hypertension, good medication compliance, no recent ambulatory blood pressures. Erectile dysfunction, new problem, experiencing difficulty with erection for a while, has taken OTC medications without help. Libido is also slightly decreased.   Past Medical History  Diagnosis Date  . 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 Hyperlipidemia   . Hypertension   . History of sarcoidosis     used steroids x 10 years  . 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 Hep C w/o coma, chronic     bx aprox 12-10, f/u by the Hep C clinic  . Benign prostatic hypertrophy   . Uveitis     h/o    Review of Systems No chest pain, shortness of breath. No claudication. No cough or lower extremity edema. No nausea, vomiting, diarrhea.    Objective:   Physical Exam  Constitutional: He is oriented to person, place, and time. He appears well-developed and well-nourished.  HENT:  Head: Normocephalic and atraumatic.  Cardiovascular: Normal rate, regular rhythm and normal heart sounds.   No murmur heard.      Normal femoral pulses  Pulmonary/Chest: Effort normal and breath sounds normal. No respiratory distress. He has no wheezes. He has no rales.  Abdominal: Soft. He exhibits no distension. There is no tenderness. There is no rebound and no guarding.       No bruit  Musculoskeletal: He exhibits no edema.  Neurological: He is alert and oriented to person, place, and time.  Psychiatric: He has a normal mood and affect. His behavior is normal. Judgment and thought content normal.       Assessment & Plan:

## 2011-07-19 ENCOUNTER — Encounter: Payer: Self-pay | Admitting: Internal Medicine

## 2011-07-19 LAB — HEMOGLOBIN A1C: Hgb A1c MFr Bld: 6.8 % — ABNORMAL HIGH (ref 4.6–6.5)

## 2011-07-22 ENCOUNTER — Encounter: Payer: Self-pay | Admitting: Internal Medicine

## 2011-08-18 ENCOUNTER — Telehealth: Payer: Self-pay | Admitting: Internal Medicine

## 2011-08-18 MED ORDER — TADALAFIL 20 MG PO TABS: 10.0000 mg | ORAL_TABLET | ORAL | Status: DC | PRN

## 2011-08-18 NOTE — Telephone Encounter (Signed)
Ok 10, 6 RF

## 2011-08-18 NOTE — Telephone Encounter (Signed)
Refill done.  

## 2011-08-18 NOTE — Telephone Encounter (Signed)
OK to fill

## 2011-08-18 NOTE — Telephone Encounter (Signed)
Patient would like hard copy of Cialis rx. Call mobile # when ready to pick up.

## 2011-10-03 ENCOUNTER — Other Ambulatory Visit: Payer: Self-pay | Admitting: Internal Medicine

## 2011-10-03 NOTE — Telephone Encounter (Signed)
Refill done.  

## 2011-11-21 ENCOUNTER — Ambulatory Visit: Admitting: Internal Medicine

## 2011-11-21 DIAGNOSIS — Z0289 Encounter for other administrative examinations: Secondary | ICD-10-CM

## 2011-12-05 ENCOUNTER — Ambulatory Visit (INDEPENDENT_AMBULATORY_CARE_PROVIDER_SITE_OTHER): Payer: Medicare Other | Admitting: Internal Medicine

## 2011-12-05 VITALS — BP 146/90 | HR 76 | Temp 98.2°F | Wt 171.0 lb

## 2011-12-05 DIAGNOSIS — I10 Essential (primary) hypertension: Secondary | ICD-10-CM | POA: Diagnosis not present

## 2011-12-05 DIAGNOSIS — N529 Male erectile dysfunction, unspecified: Secondary | ICD-10-CM

## 2011-12-05 DIAGNOSIS — E119 Type 2 diabetes mellitus without complications: Secondary | ICD-10-CM

## 2011-12-05 DIAGNOSIS — M25519 Pain in unspecified shoulder: Secondary | ICD-10-CM | POA: Diagnosis not present

## 2011-12-05 MED ORDER — GLIMEPIRIDE 2 MG PO TABS: ORAL_TABLET | ORAL | Status: DC

## 2011-12-05 MED ORDER — FLUTICASONE PROPIONATE 50 MCG/ACT NA SUSP: 2.0000 | Freq: Every day | NASAL | Status: DC

## 2011-12-05 MED ORDER — BENAZEPRIL HCL 20 MG PO TABS: 20.0000 mg | ORAL_TABLET | Freq: Every day | ORAL | Status: DC

## 2011-12-05 MED ORDER — AMLODIPINE BESYLATE 10 MG PO TABS: 10.0000 mg | ORAL_TABLET | Freq: Every day | ORAL | Status: DC

## 2011-12-05 NOTE — Assessment & Plan Note (Signed)
Tried Cialis with good results according to the patient

## 2011-12-05 NOTE — Assessment & Plan Note (Addendum)
Currently  taking Amaryl 2 mg ----> 2 tablets in morning, 1 tablet in the afternoon Having episodes of low blood sugar at  mid morning, around 60, feeling very tired. Plan: decrease Amaryl to 1.5 tablets in the morning and one tablet in the afternoon Check an A1c

## 2011-12-05 NOTE — Assessment & Plan Note (Signed)
BP slightly elevated today at 146/90, reports normal ambulatory BPs. Plan: Increase amlodipine to 10 mg

## 2011-12-05 NOTE — Patient Instructions (Signed)
Check the  blood pressure 2 or 3 times a week, be sure it is between 110/60 and 140/85. If it is consistently higher or lower, let me know  

## 2011-12-05 NOTE — Assessment & Plan Note (Signed)
New problem. Shoulder pain with radicular features?. Plan: Orthopedic referral

## 2011-12-05 NOTE — Progress Notes (Signed)
  Subjective:    Patient ID: Jeffery York, male    DOB: 1955/10/10, 56 y.o.   MRN: [id number]  HPI Routine visit Diabetes, currently taking Amaryl 2 mg 2 tablets in the morning and one in the afternoon, blood sugars drop at midmorning he feels fatigued and exhausted, CBG as low as  60. Thinks that lower sugars related to better diet. Hypertension, good medication compliance, ambulatory BPs normal. ED, tried Cialis, did help. 3 weeks history of left shoulder pain, symptoms are ill-defined, very vague on the description: Sharp, anteriorly located, sometimes tingling, sometimes decrease with the stretching. Neck pain? "Kind of", couldn't be more specific.  Past Medical History  Diagnosis Date  . 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 Hyperlipidemia   . Hypertension   . History of sarcoidosis     used steroids x 10 years  . 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 Hep C w/o coma, chronic     bx aprox 12-10, f/u by the Hep C clinic  . Benign prostatic hypertrophy   . Uveitis     h/o   Past Surgical History  Procedure Date  . Lung resection aspergilloma 2000     Review of Systems Feeling fatigued for a couple of days, denies fever or chills.No nausea, vomiting or diarrhea. No feet paresthesias.     Objective:   Physical Exam General -- alert, well-developed.No apparent distress.  Lungs -- normal respiratory effort, no intercostal retractions, no accessory muscle use, and normal breath sounds.   Heart-- normal rate, regular rhythm, no murmur, and no gallop.   DIABETIC FEET EXAM: No lower extremity edema Normal pedal pulses bilaterally Skin and nails are normal without calluses Pinprick examination of the feet normal. Neurologic-- alert & oriented X3, motor and DTRs symmetric. Musculoskeletal-- shoulders symmetric, range of motion symmetric as well with some left shoulder pain with passive rotation.  Slightly tender at the a.c. joint on the left. Psych-- Cognition and judgment appear intact. Alert and cooperative with normal attention span and concentration.  not anxious appearing and not depressed appearing.       Assessment & Plan:

## 2011-12-06 ENCOUNTER — Encounter: Payer: Self-pay | Admitting: Internal Medicine

## 2011-12-06 LAB — HEMOGLOBIN A1C: Hgb A1c MFr Bld: 7 % — ABNORMAL HIGH (ref 4.6–6.5)

## 2011-12-11 ENCOUNTER — Encounter: Payer: Self-pay | Admitting: *Deleted

## 2011-12-20 DIAGNOSIS — M5412 Radiculopathy, cervical region: Secondary | ICD-10-CM | POA: Diagnosis not present

## 2011-12-20 DIAGNOSIS — M25519 Pain in unspecified shoulder: Secondary | ICD-10-CM | POA: Diagnosis not present

## 2012-02-09 DIAGNOSIS — M25519 Pain in unspecified shoulder: Secondary | ICD-10-CM | POA: Diagnosis not present

## 2012-05-01 DIAGNOSIS — Z23 Encounter for immunization: Secondary | ICD-10-CM | POA: Diagnosis not present

## 2012-06-17 ENCOUNTER — Other Ambulatory Visit: Payer: Self-pay | Admitting: Internal Medicine

## 2012-06-17 NOTE — Telephone Encounter (Signed)
Refill done.  

## 2012-08-02 ENCOUNTER — Other Ambulatory Visit: Payer: Self-pay | Admitting: Internal Medicine

## 2012-08-02 NOTE — Telephone Encounter (Signed)
Refill done.  

## 2012-11-12 ENCOUNTER — Other Ambulatory Visit: Payer: Self-pay | Admitting: Internal Medicine

## 2012-11-12 ENCOUNTER — Encounter: Payer: Self-pay | Admitting: *Deleted

## 2012-11-12 NOTE — Telephone Encounter (Signed)
Refill done.  Letter mailed to pt to make aware he is due for a CPE.

## 2013-01-20 ENCOUNTER — Ambulatory Visit (INDEPENDENT_AMBULATORY_CARE_PROVIDER_SITE_OTHER): Payer: Medicare Other | Admitting: Internal Medicine

## 2013-01-20 ENCOUNTER — Encounter: Payer: Self-pay | Admitting: Internal Medicine

## 2013-01-20 VITALS — BP 140/80 | HR 64 | Temp 98.0°F | Ht 68.0 in | Wt 171.4 lb

## 2013-01-20 DIAGNOSIS — I10 Essential (primary) hypertension: Secondary | ICD-10-CM | POA: Diagnosis not present

## 2013-01-20 DIAGNOSIS — N400 Enlarged prostate without lower urinary tract symptoms: Secondary | ICD-10-CM

## 2013-01-20 DIAGNOSIS — E119 Type 2 diabetes mellitus without complications: Secondary | ICD-10-CM

## 2013-01-20 DIAGNOSIS — Z125 Encounter for screening for malignant neoplasm of prostate: Secondary | ICD-10-CM | POA: Diagnosis not present

## 2013-01-20 DIAGNOSIS — Z0000 Encounter for general adult medical examination without abnormal findings: Secondary | ICD-10-CM

## 2013-01-20 LAB — HEMOGLOBIN A1C: Hgb A1c MFr Bld: 6.9 % — ABNORMAL HIGH (ref 4.6–6.5)

## 2013-01-20 LAB — COMPREHENSIVE METABOLIC PANEL
ALT: 70 U/L — ABNORMAL HIGH (ref 0–53)
AST: 61 U/L — ABNORMAL HIGH (ref 0–37)
Albumin: 3.9 g/dL (ref 3.5–5.2)
Calcium: 9.1 mg/dL (ref 8.4–10.5)
Chloride: 100 mEq/L (ref 96–112)
Creatinine, Ser: 1 mg/dL (ref 0.4–1.5)
Potassium: 4.1 mEq/L (ref 3.5–5.1)
Sodium: 135 mEq/L (ref 135–145)
Total Protein: 8.1 g/dL (ref 6.0–8.3)

## 2013-01-20 LAB — CBC WITH DIFFERENTIAL/PLATELET
Basophils Absolute: 0 10*3/uL (ref 0.0–0.1)
Eosinophils Absolute: 0.1 10*3/uL (ref 0.0–0.7)
Hemoglobin: 15.1 g/dL (ref 13.0–17.0)
Lymphocytes Relative: 27.7 % (ref 12.0–46.0)
MCHC: 34.2 g/dL (ref 30.0–36.0)
Neutro Abs: 3.7 10*3/uL (ref 1.4–7.7)
RDW: 13.1 % (ref 11.5–14.6)

## 2013-01-20 LAB — TSH: TSH: 1.38 u[IU]/mL (ref 0.35–5.50)

## 2013-01-20 LAB — LIPID PANEL: HDL: 50 mg/dL (ref >39.00)

## 2013-01-20 MED ORDER — GLUCOSE BLOOD VI STRP: ORAL_STRIP | Status: DC

## 2013-01-20 MED ORDER — TADALAFIL 20 MG PO TABS: 10.0000 mg | ORAL_TABLET | ORAL | Status: DC | PRN

## 2013-01-20 NOTE — Patient Instructions (Addendum)
Get your blood work before you leave  Next visit in  4 months for a physical exam  Please make an appointment before you leave the office today (or call few weeks in advance)

## 2013-01-20 NOTE — Assessment & Plan Note (Signed)
Ambulatory BPs usually 140/80. No change for now. Will check labs

## 2013-01-20 NOTE — Assessment & Plan Note (Addendum)
Will do a  yearly checkup at next office visit Abnormal nail, getting better per pt, observe --- Td 2004 pneumonia shot 2007  Cscope 5-10, diverticuli, next in 10 years

## 2013-01-20 NOTE — Assessment & Plan Note (Signed)
Asymptomatic, DRE normal, check PSA

## 2013-01-20 NOTE — Assessment & Plan Note (Signed)
CBGs in the low 100s, states that his sugars are usually well controlled as long as he keeps his weight around 170 or less. Labs.

## 2013-01-20 NOTE — Progress Notes (Signed)
  Subjective:    Patient ID: Jeffery York, male    DOB: 1955/09/16, 57 y.o.   MRN: [id number]  HPI Last office visit more than a year ago Diabetes, good medication compliance, takes the second dose of Amaryl as needed Hyperlipidemia, taking a number of OTC supplements, see medication list. Hypertension, good medication compliance.Takes ambulatory BPs regularly, usually around 140/80 is C/o dystrophy nail-- Second right toe, actually area is getting better.  Past Medical History  Diagnosis Date  . 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 Hyperlipidemia   . Hypertension   . History of sarcoidosis     used steroids x 10 years  . 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 Hep C w/o coma, chronic     bx aprox 12-10, f/u by the Hep C clinic  . Benign prostatic hypertrophy   . Uveitis     h/o   Past Surgical History  Procedure Laterality Date  . Lung resection aspergilloma  10/14/98   History   Social History  . Marital Status: Married    Spouse Name: N/A    Number of Children: 0  . Years of Education: N/A   Occupational History  . retired Hotel manager    Social History Main Topics  . Smoking status: Former Games developer  . Smokeless tobacco: Never Used     Comment: remote smoker  . Alcohol Use: Yes     Comment: rarely   . Drug Use: No  . Sexual Activity: Not on file   Other Topics Concern  . Not on file   Social History Narrative   Mother- Loann Quill one of my patients, deceased ~ 10/14/11   Married, No children                       Review of Systems Diet, healthy most of the time Exercise--remains active, doing yoga frequently. Denies chest pain or shortness or breath No nausea, vomiting, diarrhea or blood in the stools. No dysuria gross hematuria. Occasionally has a ill-defined right upper back pain      Objective:   Physical Exam BP 140/80  Pulse 64  Temp(Src) 98 F (36.7 C) (Oral)  Ht  5\' 8"  (1.727 m)  Wt 171 lb 6.4 oz (77.747 kg)  BMI 26.07 kg/m2  SpO2 97% General -- alert, well-developed, NAD.  Neck --no thyromegaly Lungs -- normal respiratory effort, no intercostal retractions, no accessory muscle use, and normal breath sounds.  Heart-- normal rate, regular rhythm, no murmur.  Abdomen-- Not distended, Good bowel sounds,soft, non-tender.  Rectal-- No external abnormalities noted. Normal sphincter tone. No rectal masses or tenderness. Brown stool, Hemoccult negative  Prostate: Prostate gland firm and smooth, no enlargement, nodularity, tenderness, mass, asymmetry or induration. Extremities-- no pretibial edema bilaterally ; Tip of a second right toenail slightly dystrophic. Skin normal. Psych-- Cognition and judgment appear intact. Alert and cooperative with normal attention span and concentration. not anxious appearing and not depressed appearing.      Assessment & Plan:

## 2013-01-21 ENCOUNTER — Encounter: Payer: Self-pay | Admitting: Internal Medicine

## 2013-01-22 ENCOUNTER — Encounter: Payer: Self-pay | Admitting: *Deleted

## 2013-02-06 ENCOUNTER — Other Ambulatory Visit: Payer: Self-pay | Admitting: Internal Medicine

## 2013-02-07 ENCOUNTER — Other Ambulatory Visit: Payer: Self-pay | Admitting: *Deleted

## 2013-02-07 DIAGNOSIS — I10 Essential (primary) hypertension: Secondary | ICD-10-CM

## 2013-02-07 MED ORDER — AMLODIPINE BESYLATE 10 MG PO TABS: ORAL_TABLET | ORAL | Status: DC

## 2013-02-07 MED ORDER — GLIMEPIRIDE 2 MG PO TABS: ORAL_TABLET | ORAL | Status: DC

## 2013-02-07 MED ORDER — BENAZEPRIL HCL 20 MG PO TABS: ORAL_TABLET | ORAL | Status: DC

## 2013-02-07 NOTE — Telephone Encounter (Signed)
rx refilled per protocol. DJR  

## 2013-02-10 ENCOUNTER — Other Ambulatory Visit: Payer: Self-pay | Admitting: Internal Medicine

## 2013-02-10 ENCOUNTER — Other Ambulatory Visit: Payer: Self-pay | Admitting: *Deleted

## 2013-02-10 DIAGNOSIS — I10 Essential (primary) hypertension: Secondary | ICD-10-CM

## 2013-02-10 DIAGNOSIS — R0981 Nasal congestion: Secondary | ICD-10-CM

## 2013-02-10 NOTE — Telephone Encounter (Signed)
Refill for Flonase sent to [facility] on [address]

## 2013-02-10 NOTE — Telephone Encounter (Signed)
This was opened in error refill has already been done.  Ag cma

## 2013-04-14 ENCOUNTER — Other Ambulatory Visit: Payer: Self-pay | Admitting: Internal Medicine

## 2013-04-14 NOTE — Telephone Encounter (Signed)
Amlodipine [facility]ill [facility] 

## 2013-04-30 DIAGNOSIS — Z23 Encounter for immunization: Secondary | ICD-10-CM | POA: Diagnosis not present

## 2013-05-12 ENCOUNTER — Other Ambulatory Visit: Payer: Self-pay | Admitting: Internal Medicine

## 2013-05-12 NOTE — Telephone Encounter (Signed)
rx refilled per protocol. DJR  

## 2013-05-20 ENCOUNTER — Telehealth: Payer: Self-pay

## 2013-05-20 NOTE — Telephone Encounter (Signed)
Medication List and allergies:  Reviewed and updated  90 day supply/mail order: na Local prescriptions: [facility] [place]  Immunizations due: discussed getting Tdap at visit  A/P:   No changes to FH or PSH T-dap--2004--due Flu vaccine--04/2013 PNA--2007 PSA--01/2013--1.38 CCS--10/2008--Dr Kaplan--next due--2020  To Discuss with Provider: Not at this time

## 2013-05-21 ENCOUNTER — Encounter: Payer: Self-pay | Admitting: Internal Medicine

## 2013-05-21 ENCOUNTER — Ambulatory Visit (INDEPENDENT_AMBULATORY_CARE_PROVIDER_SITE_OTHER): Payer: Medicare Other | Admitting: Internal Medicine

## 2013-05-21 VITALS — BP 136/73 | HR 60 | Temp 98.1°F | Ht 68.1 in | Wt 165.0 lb

## 2013-05-21 DIAGNOSIS — E119 Type 2 diabetes mellitus without complications: Secondary | ICD-10-CM | POA: Diagnosis not present

## 2013-05-21 DIAGNOSIS — R55 Syncope and collapse: Secondary | ICD-10-CM | POA: Insufficient documentation

## 2013-05-21 DIAGNOSIS — B1710 Acute hepatitis C without hepatic coma: Secondary | ICD-10-CM

## 2013-05-21 DIAGNOSIS — Z0000 Encounter for general adult medical examination without abnormal findings: Secondary | ICD-10-CM

## 2013-05-21 DIAGNOSIS — I10 Essential (primary) hypertension: Secondary | ICD-10-CM

## 2013-05-21 DIAGNOSIS — Z23 Encounter for immunization: Secondary | ICD-10-CM | POA: Diagnosis not present

## 2013-05-21 LAB — COMPREHENSIVE METABOLIC PANEL
ALT: 42 U/L (ref 0–53)
AST: 48 U/L — ABNORMAL HIGH (ref 0–37)
Albumin: 4 g/dL (ref 3.5–5.2)
Alkaline Phosphatase: 48 U/L (ref 39–117)
BUN: 18 mg/dL (ref 6–23)
Potassium: 4.3 mEq/L (ref 3.5–5.1)
Sodium: 135 mEq/L (ref 135–145)
Total Protein: 8.1 g/dL (ref 6.0–8.3)

## 2013-05-21 NOTE — Assessment & Plan Note (Addendum)
See history of present illness, question of syncope. Neurovascular exam normal. EKG sinus brady Plan: observation for now , If symptoms happen again, will need further eval. Holter, echo?

## 2013-05-21 NOTE — Assessment & Plan Note (Addendum)
Good compliance w/medications, ambulatory BPs around 120/80

## 2013-05-21 NOTE — Progress Notes (Signed)
Pre visit review using our clinic review tool, if applicable. No additional management support is needed unless otherwise documented below in the visit note. 

## 2013-05-21 NOTE — Progress Notes (Signed)
   Subjective:    Patient ID: Jeffery York, male    DOB: 07-May-1956, 57 y.o.   MRN: [id number]  HPI CPX In addition to his physical we addressed several other issues, see a/p States that he forgot to tell me that a year ago he had a syncopal episode. He was in his kitchen and he simply slumped and fell on the floor, question of LOC x one or 2 seconds. That was not associated with chest pain, palpitations, headache, slurred speech, bladder or bladder incontinence or postictal state. Symptoms have not recurred  Past Medical History  Diagnosis Date  . 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 Hyperlipidemia   . Hypertension   . History of sarcoidosis     used steroids x 10 years  . 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 Hep C w/o coma, chronic     bx aprox 12-10, f/u by the Hep C clinic  . Benign prostatic hypertrophy   . Uveitis     h/o   Past Surgical History  Procedure Laterality Date  . Lung resection aspergilloma  11/06/98   History   Social History  . Marital Status: Married    Spouse Name: N/A    Number of Children: 0  . Years of Education: N/A   Occupational History  . retired Hotel manager    Social History Main Topics  . Smoking status: Former Games developer  . Smokeless tobacco: Never Used     Comment: remote smoker  . Alcohol Use: Yes     Comment: rarely   . Drug Use: No  . Sexual Activity: Not on file   Other Topics Concern  . Not on file   Social History Narrative   Mother- Loann Quill one of my patients, deceased ~ 2011-11-06   Married, No children                      Family History  Problem Relation Age of Onset  . Prostate cancer Brother     at age 48s  . Heart attack Brother     at age 88  . Lung cancer Father   . Diabetes Other     father side   . Colon cancer Neg Hx     Review of Systems Diet-- healthy Exercise-- yoga frequently, walks  No  CP, SOB, lower extremity  edema Denies  nausea, vomiting diarrhea Denies  blood in the stools (-) cough, sputum production, (-) wheezing , (-)hemoptysis No dysuria, gross hematuria, difficulty urinating         Objective:   Physical Exam BP 136/73  Pulse 60  Temp(Src) 98.1 F (36.7 C)  Ht 5' 8.1" (1.73 m)  Wt 165 lb (74.844 kg)  BMI 25.01 kg/m2  SpO2 100% General -- alert, well-developed, NAD.  Neck --no thyromegaly , normal carotid pulse Lungs -- normal respiratory effort, no intercostal retractions, no accessory muscle use, and normal breath sounds.  Heart-- normal rate, regular rhythm, no murmur.  Abdomen-- Not distended, good bowel sounds,soft, non-tender. Extremities-- no pretibial edema bilaterally  Neurologic--  alert & oriented X3. Speech normal, gait normal, strength normal in all extremities.  Psych-- Cognition and judgment appear intact. Cooperative with normal attention span and concentration. No anxious appearing , no depressed appearing.      Assessment & Plan:

## 2013-05-21 NOTE — Assessment & Plan Note (Signed)
Good compliance with medications, blood sugars are usually less than 120, labs

## 2013-05-21 NOTE — Patient Instructions (Signed)
Get your blood work before you leave  Next visit for a  follow up (30 minutes)  , no fasting, in 5 months  Please make an appointment

## 2013-05-21 NOTE — Assessment & Plan Note (Addendum)
Td 2014 Had a flu shot pneumonia shot 2007  And today PSA DRE 8-14 wnl Labs Cscope 5-10, diverticuli, next in 10 years Diet-exercise discussed

## 2013-05-21 NOTE — Assessment & Plan Note (Addendum)
Needs a referral to the  hepatitis clinic, phone number [phone]. Will  check for hepatitis a and B immunity Also recommend to discontinue all OTC supplements except multi vitamins, calcium, vitamin D

## 2013-05-22 ENCOUNTER — Telehealth: Payer: Self-pay | Admitting: *Deleted

## 2013-05-22 ENCOUNTER — Ambulatory Visit: Payer: Medicare Other | Admitting: Internal Medicine

## 2013-05-22 LAB — HEPATITIS B SURFACE ANTIBODY,QUALITATIVE: Hep B S Ab: NEGATIVE

## 2013-05-22 NOTE — Progress Notes (Signed)
Spoke with patient, he made an appt for 12/22 to get Hep A and B immunizations. He was also given lab results.

## 2013-05-26 ENCOUNTER — Ambulatory Visit (INDEPENDENT_AMBULATORY_CARE_PROVIDER_SITE_OTHER): Payer: Medicare Other | Admitting: *Deleted

## 2013-05-26 DIAGNOSIS — Z23 Encounter for immunization: Secondary | ICD-10-CM | POA: Diagnosis not present

## 2013-06-03 NOTE — Telephone Encounter (Signed)
error 

## 2013-06-14 ENCOUNTER — Other Ambulatory Visit: Payer: Self-pay | Admitting: Internal Medicine

## 2013-06-16 NOTE — Telephone Encounter (Signed)
Benazepril refilled per protocol. JG//CMA 

## 2013-08-10 ENCOUNTER — Other Ambulatory Visit: Payer: Self-pay | Admitting: Internal Medicine

## 2013-08-10 DIAGNOSIS — E119 Type 2 diabetes mellitus without complications: Secondary | ICD-10-CM

## 2013-08-11 NOTE — Telephone Encounter (Signed)
Refill for amaryl sent to [facility]

## 2013-08-28 ENCOUNTER — Encounter: Payer: Self-pay | Admitting: Internal Medicine

## 2013-09-29 DIAGNOSIS — B182 Chronic viral hepatitis C: Secondary | ICD-10-CM | POA: Diagnosis not present

## 2013-09-30 ENCOUNTER — Other Ambulatory Visit: Payer: Self-pay | Admitting: Nurse Practitioner

## 2013-09-30 DIAGNOSIS — B182 Chronic viral hepatitis C: Secondary | ICD-10-CM

## 2013-10-01 ENCOUNTER — Ambulatory Visit
Admission: RE | Admit: 2013-10-01 | Discharge: 2013-10-01 | Disposition: A | Discharge status: Home / Self Care | Payer: Medicare Other | Source: Ambulatory Visit | Attending: Nurse Practitioner | Admitting: Nurse Practitioner

## 2013-10-01 DIAGNOSIS — K7460 Unspecified cirrhosis of liver: Secondary | ICD-10-CM | POA: Diagnosis not present

## 2013-10-01 DIAGNOSIS — B182 Chronic viral hepatitis C: Secondary | ICD-10-CM

## 2013-10-01 MED ORDER — IOHEXOL 350 MG/ML SOLN
100.0000 mL | Freq: Once | INTRAVENOUS | Status: AC | PRN
Start: 2013-10-01 — End: 2013-10-01
  Administered 2013-10-01: 100 mL via INTRAVENOUS

## 2013-10-06 ENCOUNTER — Other Ambulatory Visit: Payer: Self-pay | Admitting: Nurse Practitioner

## 2013-10-06 DIAGNOSIS — B1920 Unspecified viral hepatitis C without hepatic coma: Secondary | ICD-10-CM

## 2013-10-08 ENCOUNTER — Telehealth: Payer: Self-pay | Admitting: Internal Medicine

## 2013-10-08 ENCOUNTER — Encounter: Payer: Self-pay | Admitting: Internal Medicine

## 2013-10-08 ENCOUNTER — Ambulatory Visit (INDEPENDENT_AMBULATORY_CARE_PROVIDER_SITE_OTHER): Payer: Medicare Other | Admitting: Internal Medicine

## 2013-10-08 VITALS — BP 155/75 | HR 67 | Temp 98.5°F | Wt 165.0 lb

## 2013-10-08 DIAGNOSIS — E119 Type 2 diabetes mellitus without complications: Secondary | ICD-10-CM | POA: Diagnosis not present

## 2013-10-08 DIAGNOSIS — B1710 Acute hepatitis C without hepatic coma: Secondary | ICD-10-CM

## 2013-10-08 DIAGNOSIS — I10 Essential (primary) hypertension: Secondary | ICD-10-CM

## 2013-10-08 DIAGNOSIS — R772 Abnormality of alphafetoprotein: Secondary | ICD-10-CM

## 2013-10-08 LAB — HEMOGLOBIN A1C: HEMOGLOBIN A1C: 6.9 % — AB (ref 4.6–6.5)

## 2013-10-08 NOTE — Progress Notes (Signed)
Pre visit review using our clinic review tool, if applicable. No additional management support is needed unless otherwise documented below in the visit note. 

## 2013-10-08 NOTE — Progress Notes (Signed)
Subjective:    Patient ID: Jeffery York, male    DOB: [birth year], 58 y.o.   MRN: [id number]  DOS:  10/08/2013 Type of  visit: Routine office visit Diabetes, good medication compliance, CBGs ranged from 80-120. Hypertension, BP today is in the 150s, recently had the hepatitis C clinic BP was also the 150s. Good compliance with medications. Hepatitis C, recently seen by a specialist, AFP was found to be elevated, see assessment and plan.   ROS Denies chest pain or difficulty breathing No nausea, vomiting, diarrhea. Denies any testicular pain, swelling or mass. He is somehow anxious about the increased AFP  Past Medical History  Diagnosis Date  . 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 Hyperlipidemia   . Hypertension   . History of sarcoidosis     used steroids x 10 years  . 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 Hep C w/o coma, chronic     bx aprox 12-10, f/u by the Hep C clinic  . Benign prostatic hypertrophy   . Uveitis     h/o    Past Surgical History  Procedure Laterality Date  . Lung resection aspergilloma  09-12-98    History   Social History  . Marital Status: Married    Spouse Name: N/A    Number of Children: 0  . Years of Education: N/A   Occupational History  . retired Nature conservation officer    Social History Main Topics  . Smoking status: Former Research scientist (life sciences)  . Smokeless tobacco: Never Used     Comment: remote smoker  . Alcohol Use: Yes     Comment: rarely   . Drug Use: No  . Sexual Activity: Not on file   Other Topics Concern  . Not on file   Social History Narrative   Mother- Lance Bosch one of my patients, deceased ~ 2011-09-12   Married, No children                           Medication List       This list is accurate as of: 10/08/13  6:18 PM.  Always use your most recent med list.               amLODipine 10 MG tablet  Commonly known as:  NORVASC  TAKE ONE TABLET BY  MOUTH EVERY DAY     aspirin 81 MG tablet  Take 81 mg by mouth daily.     benazepril 20 MG tablet  Commonly known as:  LOTENSIN  TAKE ONE TABLET BY MOUTH ONCE DAILY     CALCIUM PO  Take 1 tablet by mouth daily.     cholecalciferol 1000 UNITS tablet  Commonly known as:  VITAMIN D  Take 1,000 Units by mouth daily.     COQ-10 PO  Take 1 tablet by mouth daily. 60mg      fluticasone 50 MCG/ACT nasal spray  Commonly known as:  FLONASE  USE TWO SPRAYS IN EACH NOSTRIL EVERY DAY     glimepiride 2 MG tablet  Commonly known as:  AMARYL  TAKE ONE TABLET BY MOUTH IN THE MORNING, THEN TAKE ONE-HALF IN THE EVENING     glucose blood test strip  Commonly known as:  ONE TOUCH TEST STRIPS  Use as instructed. Check blood sugar twice daily.     multivitamin tablet  Take  1 tablet by mouth daily.     tadalafil 20 MG tablet  Commonly known as:  CIALIS  Take 0.5-1 tablets (10-20 mg total) by mouth every other day as needed for erectile dysfunction.           Objective:   Physical Exam BP 155/75  Pulse 67  Temp(Src) 98.5 F (36.9 C)  Wt 165 lb (74.844 kg)  SpO2 96% General -- alert, well-developed, NAD.  Lungs -- normal respiratory effort, no intercostal retractions, no accessory muscle use, and normal breath sounds.  Heart-- normal rate, regular rhythm, no murmur.  Abdomen-- Not distended, good bowel sounds,soft, non-tender. GU: Penis normal Scrotal contents normal, testicles symmetric, epididymal cysts on the right? Neurologic--  alert & oriented X3. Speech normal, gait normal, strength normal in all extremities.  Psych-- Cognition and judgment appear intact. Cooperative with normal attention span and concentration. No anxious or depressed appearing. \    Assessment & Plan:

## 2013-10-08 NOTE — Telephone Encounter (Signed)
Relevant patient education assigned to patient using Emmi. ° °

## 2013-10-08 NOTE — Assessment & Plan Note (Addendum)
BP in the 150s today and also recently elevated  at another clinic. Continue present care but asked the patient to carefully check his BP at home, if it is consistently elevated we'll adjust his medication

## 2013-10-08 NOTE — Assessment & Plan Note (Addendum)
Recently found to have the elevated AFP, had a CT of the abdomen and showed no lesions or cancer in the liver , to have a MRI. Another potential source of AFP is testicular cancer, clinical exam negative ---> order a scrotal ultrasound. Also patient is well aware he needs hepatitis A- B   shots, reports he is getting some shots from the specialist

## 2013-10-08 NOTE — Assessment & Plan Note (Signed)
Labs

## 2013-10-08 NOTE — Patient Instructions (Signed)
Get your blood work before you leave   Check the  blood pressure 2 or 3 times a week be sure it is between 110/60 and 140/85. Ideal blood pressure is 120/80. If it is consistently higher or lower, let me know   Next visit is for a physical exam by 05-2014 ,  fasting Please make an appointment

## 2013-10-09 ENCOUNTER — Ambulatory Visit
Admission: RE | Admit: 2013-10-09 | Discharge: 2013-10-09 | Disposition: A | Discharge status: Home / Self Care | Payer: Medicare Other | Source: Ambulatory Visit | Attending: Internal Medicine | Admitting: Internal Medicine

## 2013-10-09 DIAGNOSIS — R772 Abnormality of alphafetoprotein: Secondary | ICD-10-CM

## 2013-10-09 DIAGNOSIS — N509 Disorder of male genital organs, unspecified: Secondary | ICD-10-CM | POA: Diagnosis not present

## 2013-10-11 ENCOUNTER — Ambulatory Visit
Admission: RE | Admit: 2013-10-11 | Discharge: 2013-10-11 | Disposition: A | Discharge status: Home / Self Care | Payer: Medicare Other | Source: Ambulatory Visit | Attending: Nurse Practitioner | Admitting: Nurse Practitioner

## 2013-10-11 ENCOUNTER — Other Ambulatory Visit: Payer: Self-pay | Admitting: Internal Medicine

## 2013-10-11 ENCOUNTER — Other Ambulatory Visit: Payer: Medicare Other

## 2013-10-11 DIAGNOSIS — K824 Cholesterolosis of gallbladder: Secondary | ICD-10-CM | POA: Diagnosis not present

## 2013-10-11 DIAGNOSIS — B1920 Unspecified viral hepatitis C without hepatic coma: Secondary | ICD-10-CM

## 2013-10-11 DIAGNOSIS — K7460 Unspecified cirrhosis of liver: Secondary | ICD-10-CM | POA: Diagnosis not present

## 2013-10-11 MED ORDER — GADOXETATE DISODIUM 0.25 MMOL/ML IV SOLN
7.0000 mL | Freq: Once | INTRAVENOUS | Status: AC | PRN
  Administered 2013-10-11: 7 mL via INTRAVENOUS

## 2013-10-13 NOTE — Telephone Encounter (Signed)
Benazepril refilled per protocol. JG//CMA 

## 2013-10-22 DIAGNOSIS — C228 Malignant neoplasm of liver, primary, unspecified as to type: Secondary | ICD-10-CM | POA: Diagnosis not present

## 2013-10-22 DIAGNOSIS — B182 Chronic viral hepatitis C: Secondary | ICD-10-CM | POA: Diagnosis not present

## 2013-10-22 DIAGNOSIS — K7460 Unspecified cirrhosis of liver: Secondary | ICD-10-CM | POA: Diagnosis not present

## 2013-11-10 ENCOUNTER — Other Ambulatory Visit: Payer: Self-pay | Admitting: Internal Medicine

## 2013-11-10 DIAGNOSIS — E785 Hyperlipidemia, unspecified: Secondary | ICD-10-CM

## 2013-11-10 DIAGNOSIS — R0981 Nasal congestion: Secondary | ICD-10-CM

## 2013-11-12 DIAGNOSIS — Z8249 Family history of ischemic heart disease and other diseases of the circulatory system: Secondary | ICD-10-CM | POA: Diagnosis not present

## 2013-11-12 DIAGNOSIS — C228 Malignant neoplasm of liver, primary, unspecified as to type: Secondary | ICD-10-CM | POA: Diagnosis not present

## 2013-11-12 DIAGNOSIS — C801 Malignant (primary) neoplasm, unspecified: Secondary | ICD-10-CM | POA: Diagnosis not present

## 2013-11-12 DIAGNOSIS — E785 Hyperlipidemia, unspecified: Secondary | ICD-10-CM | POA: Diagnosis not present

## 2013-11-12 DIAGNOSIS — E119 Type 2 diabetes mellitus without complications: Secondary | ICD-10-CM | POA: Diagnosis not present

## 2013-11-12 DIAGNOSIS — K7460 Unspecified cirrhosis of liver: Secondary | ICD-10-CM | POA: Diagnosis not present

## 2013-11-12 DIAGNOSIS — Z7982 Long term (current) use of aspirin: Secondary | ICD-10-CM | POA: Diagnosis not present

## 2013-11-12 DIAGNOSIS — Z801 Family history of malignant neoplasm of trachea, bronchus and lung: Secondary | ICD-10-CM | POA: Diagnosis not present

## 2013-11-12 DIAGNOSIS — I10 Essential (primary) hypertension: Secondary | ICD-10-CM | POA: Diagnosis not present

## 2013-11-12 DIAGNOSIS — I671 Cerebral aneurysm, nonruptured: Secondary | ICD-10-CM | POA: Diagnosis not present

## 2013-11-12 DIAGNOSIS — B1920 Unspecified viral hepatitis C without hepatic coma: Secondary | ICD-10-CM | POA: Diagnosis not present

## 2013-11-12 DIAGNOSIS — M810 Age-related osteoporosis without current pathological fracture: Secondary | ICD-10-CM | POA: Diagnosis not present

## 2013-11-12 DIAGNOSIS — Z9889 Other specified postprocedural states: Secondary | ICD-10-CM | POA: Diagnosis not present

## 2013-11-12 DIAGNOSIS — Z48813 Encounter for surgical aftercare following surgery on the respiratory system: Secondary | ICD-10-CM | POA: Diagnosis not present

## 2013-11-12 DIAGNOSIS — D869 Sarcoidosis, unspecified: Secondary | ICD-10-CM | POA: Diagnosis not present

## 2013-11-12 DIAGNOSIS — Z79899 Other long term (current) drug therapy: Secondary | ICD-10-CM | POA: Diagnosis not present

## 2013-11-13 DIAGNOSIS — Z48813 Encounter for surgical aftercare following surgery on the respiratory system: Secondary | ICD-10-CM | POA: Diagnosis not present

## 2013-11-13 DIAGNOSIS — I10 Essential (primary) hypertension: Secondary | ICD-10-CM | POA: Diagnosis not present

## 2013-11-13 DIAGNOSIS — D869 Sarcoidosis, unspecified: Secondary | ICD-10-CM | POA: Diagnosis not present

## 2013-11-13 DIAGNOSIS — E119 Type 2 diabetes mellitus without complications: Secondary | ICD-10-CM | POA: Diagnosis not present

## 2013-11-13 DIAGNOSIS — Z9889 Other specified postprocedural states: Secondary | ICD-10-CM | POA: Diagnosis not present

## 2013-11-13 DIAGNOSIS — B1920 Unspecified viral hepatitis C without hepatic coma: Secondary | ICD-10-CM | POA: Diagnosis not present

## 2013-11-13 DIAGNOSIS — K7460 Unspecified cirrhosis of liver: Secondary | ICD-10-CM | POA: Diagnosis not present

## 2013-11-13 DIAGNOSIS — C228 Malignant neoplasm of liver, primary, unspecified as to type: Secondary | ICD-10-CM | POA: Diagnosis not present

## 2013-12-04 MED ORDER — BENAZEPRIL HCL 20 MG PO TABS: ORAL_TABLET | ORAL | Status: DC

## 2013-12-04 MED ORDER — GLIMEPIRIDE 2 MG PO TABS: ORAL_TABLET | ORAL | Status: DC

## 2013-12-04 MED ORDER — AMLODIPINE BESYLATE 10 MG PO TABS: ORAL_TABLET | ORAL | Status: DC

## 2013-12-04 MED ORDER — FLUTICASONE PROPIONATE 50 MCG/ACT NA SUSP: NASAL | Status: DC

## 2013-12-04 NOTE — Addendum Note (Signed)
Addended by: Peggyann Shoals on: 12/04/2013 04:24 PM   Modules accepted: Orders

## 2013-12-04 NOTE — Addendum Note (Signed)
Addended by: Peggyann Shoals on: 12/04/2013 04:27 PM   Modules accepted: Orders

## 2013-12-09 DIAGNOSIS — C229 Malignant neoplasm of liver, not specified as primary or secondary: Secondary | ICD-10-CM | POA: Diagnosis not present

## 2013-12-09 DIAGNOSIS — C228 Malignant neoplasm of liver, primary, unspecified as to type: Secondary | ICD-10-CM | POA: Diagnosis not present

## 2014-01-01 DIAGNOSIS — E119 Type 2 diabetes mellitus without complications: Secondary | ICD-10-CM | POA: Diagnosis not present

## 2014-01-01 DIAGNOSIS — Z7982 Long term (current) use of aspirin: Secondary | ICD-10-CM | POA: Diagnosis not present

## 2014-01-01 DIAGNOSIS — B1920 Unspecified viral hepatitis C without hepatic coma: Secondary | ICD-10-CM | POA: Diagnosis not present

## 2014-01-01 DIAGNOSIS — Z5111 Encounter for antineoplastic chemotherapy: Secondary | ICD-10-CM | POA: Diagnosis not present

## 2014-01-01 DIAGNOSIS — Z79899 Other long term (current) drug therapy: Secondary | ICD-10-CM | POA: Diagnosis not present

## 2014-01-01 DIAGNOSIS — C229 Malignant neoplasm of liver, not specified as primary or secondary: Secondary | ICD-10-CM | POA: Diagnosis not present

## 2014-01-01 DIAGNOSIS — K7460 Unspecified cirrhosis of liver: Secondary | ICD-10-CM | POA: Diagnosis not present

## 2014-01-01 DIAGNOSIS — C228 Malignant neoplasm of liver, primary, unspecified as to type: Secondary | ICD-10-CM | POA: Diagnosis not present

## 2014-01-01 DIAGNOSIS — IMO0002 Reserved for concepts with insufficient information to code with codable children: Secondary | ICD-10-CM | POA: Diagnosis not present

## 2014-01-01 DIAGNOSIS — C801 Malignant (primary) neoplasm, unspecified: Secondary | ICD-10-CM | POA: Diagnosis not present

## 2014-01-01 DIAGNOSIS — I10 Essential (primary) hypertension: Secondary | ICD-10-CM | POA: Diagnosis not present

## 2014-01-02 DIAGNOSIS — Z5111 Encounter for antineoplastic chemotherapy: Secondary | ICD-10-CM | POA: Diagnosis not present

## 2014-01-02 DIAGNOSIS — K7460 Unspecified cirrhosis of liver: Secondary | ICD-10-CM | POA: Diagnosis not present

## 2014-01-02 DIAGNOSIS — B1920 Unspecified viral hepatitis C without hepatic coma: Secondary | ICD-10-CM | POA: Diagnosis not present

## 2014-01-02 DIAGNOSIS — Z7982 Long term (current) use of aspirin: Secondary | ICD-10-CM | POA: Diagnosis not present

## 2014-01-02 DIAGNOSIS — C228 Malignant neoplasm of liver, primary, unspecified as to type: Secondary | ICD-10-CM | POA: Diagnosis not present

## 2014-01-02 DIAGNOSIS — B182 Chronic viral hepatitis C: Secondary | ICD-10-CM | POA: Diagnosis not present

## 2014-01-02 DIAGNOSIS — IMO0002 Reserved for concepts with insufficient information to code with codable children: Secondary | ICD-10-CM | POA: Diagnosis not present

## 2014-01-28 DIAGNOSIS — C228 Malignant neoplasm of liver, primary, unspecified as to type: Secondary | ICD-10-CM | POA: Diagnosis not present

## 2014-01-28 DIAGNOSIS — C229 Malignant neoplasm of liver, not specified as primary or secondary: Secondary | ICD-10-CM | POA: Diagnosis not present

## 2014-01-28 DIAGNOSIS — K766 Portal hypertension: Secondary | ICD-10-CM | POA: Diagnosis not present

## 2014-01-28 DIAGNOSIS — K7460 Unspecified cirrhosis of liver: Secondary | ICD-10-CM | POA: Diagnosis not present

## 2014-01-28 DIAGNOSIS — K8020 Calculus of gallbladder without cholecystitis without obstruction: Secondary | ICD-10-CM | POA: Diagnosis not present

## 2014-01-28 DIAGNOSIS — K7689 Other specified diseases of liver: Secondary | ICD-10-CM | POA: Diagnosis not present

## 2014-04-14 DIAGNOSIS — B182 Chronic viral hepatitis C: Secondary | ICD-10-CM | POA: Diagnosis not present

## 2014-04-21 DIAGNOSIS — K7460 Unspecified cirrhosis of liver: Secondary | ICD-10-CM | POA: Diagnosis not present

## 2014-04-21 DIAGNOSIS — Z9889 Other specified postprocedural states: Secondary | ICD-10-CM | POA: Diagnosis not present

## 2014-04-21 DIAGNOSIS — Z01818 Encounter for other preprocedural examination: Secondary | ICD-10-CM | POA: Diagnosis not present

## 2014-04-21 DIAGNOSIS — K7469 Other cirrhosis of liver: Secondary | ICD-10-CM | POA: Diagnosis not present

## 2014-04-21 DIAGNOSIS — K8020 Calculus of gallbladder without cholecystitis without obstruction: Secondary | ICD-10-CM | POA: Diagnosis not present

## 2014-04-22 DIAGNOSIS — C220 Liver cell carcinoma: Secondary | ICD-10-CM | POA: Diagnosis not present

## 2014-04-22 DIAGNOSIS — Z79899 Other long term (current) drug therapy: Secondary | ICD-10-CM | POA: Diagnosis not present

## 2014-04-22 DIAGNOSIS — B182 Chronic viral hepatitis C: Secondary | ICD-10-CM | POA: Diagnosis not present

## 2014-04-22 DIAGNOSIS — K7469 Other cirrhosis of liver: Secondary | ICD-10-CM | POA: Diagnosis not present

## 2014-04-22 DIAGNOSIS — K7460 Unspecified cirrhosis of liver: Secondary | ICD-10-CM | POA: Diagnosis not present

## 2014-04-22 DIAGNOSIS — Z7682 Awaiting organ transplant status: Secondary | ICD-10-CM | POA: Diagnosis not present

## 2014-04-23 DIAGNOSIS — K7460 Unspecified cirrhosis of liver: Secondary | ICD-10-CM | POA: Diagnosis not present

## 2014-04-23 DIAGNOSIS — I6523 Occlusion and stenosis of bilateral carotid arteries: Secondary | ICD-10-CM | POA: Diagnosis not present

## 2014-04-23 DIAGNOSIS — Z01818 Encounter for other preprocedural examination: Secondary | ICD-10-CM | POA: Diagnosis not present

## 2014-04-28 ENCOUNTER — Encounter: Payer: Self-pay | Admitting: Internal Medicine

## 2014-04-28 ENCOUNTER — Ambulatory Visit (INDEPENDENT_AMBULATORY_CARE_PROVIDER_SITE_OTHER): Payer: Medicare Other | Admitting: Internal Medicine

## 2014-04-28 ENCOUNTER — Ambulatory Visit (HOSPITAL_BASED_OUTPATIENT_CLINIC_OR_DEPARTMENT_OTHER)
Admission: RE | Admit: 2014-04-28 | Discharge: 2014-04-28 | Disposition: A | Discharge status: Home / Self Care | Payer: Medicare Other | Source: Ambulatory Visit | Attending: Internal Medicine | Admitting: Internal Medicine

## 2014-04-28 VITALS — BP 132/86 | HR 92 | Temp 98.8°F | Wt 164.0 lb

## 2014-04-28 DIAGNOSIS — M79671 Pain in right foot: Secondary | ICD-10-CM | POA: Diagnosis not present

## 2014-04-28 DIAGNOSIS — M7989 Other specified soft tissue disorders: Secondary | ICD-10-CM | POA: Diagnosis not present

## 2014-04-28 DIAGNOSIS — M10271 Drug-induced gout, right ankle and foot: Secondary | ICD-10-CM

## 2014-04-28 DIAGNOSIS — M7731 Calcaneal spur, right foot: Secondary | ICD-10-CM | POA: Diagnosis not present

## 2014-04-28 DIAGNOSIS — M109 Gout, unspecified: Secondary | ICD-10-CM | POA: Insufficient documentation

## 2014-04-28 DIAGNOSIS — M1020 Drug-induced gout, unspecified site: Secondary | ICD-10-CM | POA: Diagnosis not present

## 2014-04-28 DIAGNOSIS — E119 Type 2 diabetes mellitus without complications: Secondary | ICD-10-CM | POA: Diagnosis not present

## 2014-04-28 MED ORDER — PREDNISONE 10 MG PO TABS: ORAL_TABLET | ORAL | Status: DC

## 2014-04-28 NOTE — Progress Notes (Signed)
Pre visit review using our clinic review tool, if applicable. No additional management support is needed unless otherwise documented below in the visit note. 

## 2014-04-28 NOTE — Progress Notes (Signed)
Subjective:    Patient ID: Jeffery York, male    DOB: 02-Jan-1956, 58 y.o.   MRN: [id number]  DOS:  04/28/2014 Type of visit - description : acute Interval history: Sx started 4-5 days ago w/ pain at the base of the 2nd R toe, extended to the base of the great toe. + swelling, redness, very painful to ambulation and to  touch. The distal foot is slt better but now is hurting at the ankle. He is on day #28 Harvoni for hepatitis C. He contacted his hepatitis doctors and they said that sometimes that medication causes or exacerbates gout. The patient has no history of gout. They recommended not to take colchicine but Allopurinol okay.   ROS  Denies fever chills No injury, no open sores or discharge noted. No change in his diet.  Past Medical History  Diagnosis Date  . 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 Hyperlipidemia   . Hypertension   . History of sarcoidosis     used steroids x 10 years  . 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 Hep C w/o coma, chronic     bx aprox 12-10, f/u by the Hep C clinic  . Benign prostatic hypertrophy   . Uveitis     h/o    Past Surgical History  Procedure Laterality Date  . Lung resection aspergilloma  09/09/1998    History   Social History  . Marital Status: Married    Spouse Name: N/A    Number of Children: 0  . Years of Education: N/A   Occupational History  . retired Nature conservation officer    Social History Main Topics  . Smoking status: Former Research scientist (life sciences)  . Smokeless tobacco: Never Used     Comment: remote smoker  . Alcohol Use: Yes     Comment: rarely   . Drug Use: No  . Sexual Activity: Not on file   Other Topics Concern  . Not on file   Social History Narrative   Mother- Lance Bosch one of my patients, deceased ~ 09/09/2011   Married, No children                           Medication List       This list is accurate as of: 04/28/14  7:30 PM.  Always  use your most recent med list.               amLODipine 10 MG tablet  Commonly known as:  NORVASC  TAKE ONE TABLET BY MOUTH ONCE DAILY     aspirin 81 MG tablet  Take 81 mg by mouth daily.     benazepril 20 MG tablet  Commonly known as:  LOTENSIN  TAKE ONE TABLET BY MOUTH ONCE DAILY     CALCIUM PO  Take 1 tablet by mouth daily.     cholecalciferol 1000 UNITS tablet  Commonly known as:  VITAMIN D  Take 1,000 Units by mouth daily.     COQ-10 PO  Take 1 tablet by mouth daily. 60mg      fluticasone 50 MCG/ACT nasal spray  Commonly known as:  FLONASE  USE TWO SPRAYS IN EACH NOSTRIL EVERY DAY     glimepiride 2 MG tablet  Commonly known as:  AMARYL  TAKE ONE TABLET BY MOUTH IN THE MORNING, THEN TAKE ONE-HALF IN THE  EVENING     glucose blood test strip  Commonly known as:  ONE TOUCH TEST STRIPS  Use as instructed. Check blood sugar twice daily.     HARVONI 90-400 MG Tabs  Generic drug:  Ledipasvir-Sofosbuvir  Take 1 tablet by mouth daily.     multivitamin tablet  Take 1 tablet by mouth daily.     predniSONE 10 MG tablet  Commonly known as:  DELTASONE  4 tablets x 2 days, 3 tabs x 2 days, 2 tabs x 2 days, 1 tab x 2 days     tadalafil 20 MG tablet  Commonly known as:  CIALIS  Take 0.5-1 tablets (10-20 mg total) by mouth every other day as needed for erectile dysfunction.           Objective:   Physical Exam  Musculoskeletal:       Feet:   BP 132/86 mmHg  Pulse 92  Temp(Src) 98.8 F (37.1 C) (Oral)  Wt 164 lb (74.39 kg)  SpO2 97%  General -- alert, well-developed, not in distress however he is unable to walk due to pain.  Extremities-- no pretibial edema bilaterally  Neurologic--  alert & oriented X3.   Psych-- Cognition and judgment appear intact. Cooperative with normal attention span and concentration. No anxious or depressed appearing.       Assessment & Plan:    Gout, Symptoms most likely due to gout in the context of taking  Harvonie. Other  considerations are cellulitis but that is less likely. I contacted Roxan Hockey at the [facility], she is involved in the patient's treatment. She recommend against the use of colchicine but it would be okay to use allopurinol or prednisone. He has no history of gout consequently will treat him empirically with prednisone. Plan: X-ray, CMP, uric acid. Prednisone taper Continue monitoring CBGs, they are usually in the 100, 130s. Call  if they are more than 200 consistently while on prednisone. Follow-up as planned 05-06-14 Gout diet discussed If not better consider indometacin or hydrocodone. If problems become recurrent ?allopurinol  Diabetes, check A1c  Today , I spent more than  30  min with the patient: >50% of the time counseling regards gout, and coordinating his care

## 2014-04-28 NOTE — Patient Instructions (Signed)
Get your blood work before you leave   Stop by the first floor and get the XR   Take prednisone as prescribed  If your blood sugar get more than 200, stop prednisone unless no  Ice  and leg elevation  Next visit in 2 months     Gout Gout is an inflammatory arthritis caused by a buildup of uric acid crystals in the joints. Uric acid is a chemical that is normally present in the blood. When the level of uric acid in the blood is too high it can form crystals that deposit in your joints and tissues. This causes joint redness, soreness, and swelling (inflammation). Repeat attacks are common. Over time, uric acid crystals can form into masses (tophi) near a joint, destroying bone and causing disfigurement. Gout is treatable and often preventable. CAUSES  The disease begins with elevated levels of uric acid in the blood. Uric acid is produced by your body when it breaks down a naturally found substance called purines. Certain foods you eat, such as meats and fish, contain high amounts of purines. Causes of an elevated uric acid level include:  Being passed down from parent to child (heredity).  Diseases that cause increased uric acid production (such as obesity, psoriasis, and certain cancers).  Excessive alcohol use.  Diet, especially diets rich in meat and seafood.  Medicines, including certain cancer-fighting medicines (chemotherapy), water pills (diuretics), and aspirin.  Chronic kidney disease. The kidneys are no longer able to remove uric acid well.  Problems with metabolism. Conditions strongly associated with gout include:  Obesity.  High blood pressure.  High cholesterol.  Diabetes. Not everyone with elevated uric acid levels gets gout. It is not understood why some people get gout and others do not. Surgery, joint injury, and eating too much of certain foods are some of the factors that can lead to gout attacks. SYMPTOMS   An attack of gout comes on quickly. It causes  intense pain with redness, swelling, and warmth in a joint.  Fever can occur.  Often, only one joint is involved. Certain joints are more commonly involved:  Base of the big toe.  Knee.  Ankle.  Wrist.  Finger. Without treatment, an attack usually goes away in a few days to weeks. Between attacks, you usually will not have symptoms, which is different from many other forms of arthritis. DIAGNOSIS  Your caregiver will suspect gout based on your symptoms and exam. In some cases, tests may be recommended. The tests may include:  Blood tests.  Urine tests.  X-rays.  Joint fluid exam. This exam requires a needle to remove fluid from the joint (arthrocentesis). Using a microscope, gout is confirmed when uric acid crystals are seen in the joint fluid. TREATMENT  There are two phases to gout treatment: treating the sudden onset (acute) attack and preventing attacks (prophylaxis).  Treatment of an Acute Attack.  Medicines are used. These include anti-inflammatory medicines or steroid medicines.  An injection of steroid medicine into the affected joint is sometimes necessary.  The painful joint is rested. Movement can worsen the arthritis.  You may use warm or cold treatments on painful joints, depending which works best for you.  Treatment to Prevent Attacks.  If you suffer from frequent gout attacks, your caregiver may advise preventive medicine. These medicines are started after the acute attack subsides. These medicines either help your kidneys eliminate uric acid from your body or decrease your uric acid production. You may need to stay on these  medicines for a very long time.  The early phase of treatment with preventive medicine can be associated with an increase in acute gout attacks. For this reason, during the first few months of treatment, your caregiver may also advise you to take medicines usually used for acute gout treatment. Be sure you understand your caregiver's  directions. Your caregiver may make several adjustments to your medicine dose before these medicines are effective.  Discuss dietary treatment with your caregiver or dietitian. Alcohol and drinks high in sugar and fructose and foods such as meat, poultry, and seafood can increase uric acid levels. Your caregiver or dietitian can advise you on drinks and foods that should be limited. HOME CARE INSTRUCTIONS   Do not take aspirin to relieve pain. This raises uric acid levels.  Only take over-the-counter or prescription medicines for pain, discomfort, or fever as directed by your caregiver.  Rest the joint as much as possible. When in bed, keep sheets and blankets off painful areas.  Keep the affected joint raised (elevated).  Apply warm or cold treatments to painful joints. Use of warm or cold treatments depends on which works best for you.  Use crutches if the painful joint is in your leg.  Drink enough fluids to keep your urine clear or pale yellow. This helps your body get rid of uric acid. Limit alcohol, sugary drinks, and fructose drinks.  Follow your dietary instructions. Pay careful attention to the amount of protein you eat. Your daily diet should emphasize fruits, vegetables, whole grains, and fat-free or low-fat milk products. Discuss the use of coffee, vitamin C, and cherries with your caregiver or dietitian. These may be helpful in lowering uric acid levels.  Maintain a healthy body weight. SEEK MEDICAL CARE IF:   You develop diarrhea, vomiting, or any side effects from medicines.  You do not feel better in 24 hours, or you are getting worse. SEEK IMMEDIATE MEDICAL CARE IF:   Your joint becomes suddenly more tender, and you have chills or a fever. MAKE SURE YOU:   Understand these instructions.  Will watch your condition.  Will get help right away if you are not doing well or get worse. Document Released: 05/19/2000 Document Revised: 10/06/2013 Document Reviewed:  01/03/2012 [facility] Patient Information 2015 [place], [place]. This information is not intended to replace advice given to you by your health care provider. Make sure you discuss any questions you have with your health care provider.

## 2014-04-29 DIAGNOSIS — B182 Chronic viral hepatitis C: Secondary | ICD-10-CM | POA: Diagnosis not present

## 2014-04-29 LAB — COMPREHENSIVE METABOLIC PANEL
ALT: 18 U/L (ref 0–53)
AST: 25 U/L (ref 0–37)
Albumin: 4 g/dL (ref 3.5–5.2)
Alkaline Phosphatase: 57 U/L (ref 39–117)
BUN: 17 mg/dL (ref 6–23)
CALCIUM: 9.6 mg/dL (ref 8.4–10.5)
CO2: 26 mEq/L (ref 19–32)
Chloride: 96 mEq/L (ref 96–112)
Creatinine, Ser: 1.2 mg/dL (ref 0.4–1.5)
GFR: 83.43 mL/min (ref >60.00)
Glucose, Bld: 188 mg/dL — ABNORMAL HIGH (ref 70–99)
Potassium: 4.5 mEq/L (ref 3.5–5.1)
Sodium: 131 mEq/L — ABNORMAL LOW (ref 135–145)
Total Bilirubin: 1.1 mg/dL (ref 0.2–1.2)
Total Protein: 8.5 g/dL — ABNORMAL HIGH (ref 6.0–8.3)

## 2014-04-29 LAB — CBC WITH DIFFERENTIAL/PLATELET
Basophils Absolute: 0 K/uL (ref 0.0–0.1)
Basophils Relative: 0.2 % (ref 0.0–3.0)
Eosinophils Absolute: 0 K/uL (ref 0.0–0.7)
Eosinophils Relative: 0.3 % (ref 0.0–5.0)
HCT: 45.5 % (ref 39.0–52.0)
Hemoglobin: 15.2 g/dL (ref 13.0–17.0)
Lymphocytes Relative: 9.4 % — ABNORMAL LOW (ref 12.0–46.0)
Lymphs Abs: 0.9 K/uL (ref 0.7–4.0)
MCHC: 33.5 g/dL (ref 30.0–36.0)
MCV: 88.8 fl (ref 78.0–100.0)
Monocytes Absolute: 0.7 K/uL (ref 0.1–1.0)
Monocytes Relative: 7.4 % (ref 3.0–12.0)
Neutro Abs: 8.2 K/uL — ABNORMAL HIGH (ref 1.4–7.7)
Neutrophils Relative %: 82.7 % — ABNORMAL HIGH (ref 43.0–77.0)
Platelets: 221 K/uL (ref 150.0–400.0)
RBC: 5.12 Mil/uL (ref 4.22–5.81)
RDW: 13.5 % (ref 11.5–15.5)
WBC: 10 K/uL (ref 4.0–10.5)

## 2014-04-29 LAB — HEMOGLOBIN A1C: Hgb A1c MFr Bld: 7 % — ABNORMAL HIGH (ref 4.6–6.5)

## 2014-04-29 LAB — URIC ACID: Uric Acid, Serum: 7.9 mg/dL — ABNORMAL HIGH (ref 4.0–7.8)

## 2014-05-06 ENCOUNTER — Encounter: Payer: Medicare Other | Admitting: Internal Medicine

## 2014-05-12 DIAGNOSIS — C220 Liver cell carcinoma: Secondary | ICD-10-CM | POA: Diagnosis not present

## 2014-05-12 DIAGNOSIS — K7469 Other cirrhosis of liver: Secondary | ICD-10-CM | POA: Diagnosis not present

## 2014-05-12 DIAGNOSIS — B182 Chronic viral hepatitis C: Secondary | ICD-10-CM | POA: Diagnosis not present

## 2014-05-19 ENCOUNTER — Telehealth: Payer: Self-pay

## 2014-05-19 MED ORDER — TADALAFIL 20 MG PO TABS: 10.0000 mg | ORAL_TABLET | ORAL | Status: DC | PRN

## 2014-05-19 NOTE — Telephone Encounter (Signed)
Pt is requesting refill on Cialis.  Last OV: 04/28/2014 Last Fill: 01/20/2013 # [phone] RF  Okay to refill?

## 2014-05-19 NOTE — Telephone Encounter (Signed)
Medication refilled to [facility]. Letter printed and mailed to Pt informing him to call office and schedule appt.

## 2014-05-19 NOTE — Telephone Encounter (Signed)
Ok 10 and 3 RF I had to cancel  the recent OV, please reschedule it

## 2014-05-26 DIAGNOSIS — K7460 Unspecified cirrhosis of liver: Secondary | ICD-10-CM | POA: Diagnosis not present

## 2014-05-26 DIAGNOSIS — C220 Liver cell carcinoma: Secondary | ICD-10-CM | POA: Diagnosis not present

## 2014-05-26 DIAGNOSIS — K8020 Calculus of gallbladder without cholecystitis without obstruction: Secondary | ICD-10-CM | POA: Diagnosis not present

## 2014-05-26 DIAGNOSIS — J479 Bronchiectasis, uncomplicated: Secondary | ICD-10-CM | POA: Diagnosis not present

## 2014-06-05 DIAGNOSIS — Z944 Liver transplant status: Secondary | ICD-10-CM

## 2014-06-05 HISTORY — DX: Liver transplant status: Z94.4

## 2014-06-08 ENCOUNTER — Telehealth: Payer: Self-pay | Admitting: Internal Medicine

## 2014-06-08 MED ORDER — ALLOPURINOL 100 MG PO TABS: 100.0000 mg | ORAL_TABLET | Freq: Every day | ORAL | Status: DC

## 2014-06-08 MED ORDER — PREDNISONE 10 MG PO TABS: ORAL_TABLET | ORAL | Status: DC

## 2014-06-08 NOTE — Telephone Encounter (Signed)
Caller name:Gretzinger, Darus Relation to PP:IRJJ Call back number:[phone] Pharmacy:[facility]  Reason for call: pt states he has another gout flare up, would like to know if dr. Larose Kells can send him in a rx for alipurinol, states the prednisone never completely got rid of the flare up and also caused his blood sugar to elevate in the 400's. States his liver doctor stated he would be ok taking the alipurinol

## 2014-06-08 NOTE — Telephone Encounter (Signed)
Spoke with Pt, informed him to begin taking Allopurinol 100 mg 1 tablet daily, and for one week to take Prednisone 10 mg 1 tablet daily for 1 week. Medication sent to [facility]. Instructed Pt to let us know if he is not feeling better within 2 weeks. Pt verbalized understanding.

## 2014-06-08 NOTE — Telephone Encounter (Signed)
Please advise 

## 2014-06-08 NOTE — Telephone Encounter (Signed)
Start allopurinol 100 mg one tablet daily #30 and 3 refills When a patient   Starts this medication there is a good chance gout will get worse temporarily thus  he needs a small dose of prednisone for a few days. Call prednisone 20 mg one daily 1 week. No RF Arrange a visit within 2 weeks, do not  start the medications and come here if symptoms are severe,has  fever chills or is not improving soon.

## 2014-06-25 DIAGNOSIS — B182 Chronic viral hepatitis C: Secondary | ICD-10-CM | POA: Diagnosis not present

## 2014-07-01 DIAGNOSIS — K7469 Other cirrhosis of liver: Secondary | ICD-10-CM | POA: Diagnosis not present

## 2014-07-01 DIAGNOSIS — B182 Chronic viral hepatitis C: Secondary | ICD-10-CM | POA: Diagnosis not present

## 2014-07-01 DIAGNOSIS — C220 Liver cell carcinoma: Secondary | ICD-10-CM | POA: Diagnosis not present

## 2014-07-23 ENCOUNTER — Ambulatory Visit (INDEPENDENT_AMBULATORY_CARE_PROVIDER_SITE_OTHER): Payer: Medicare Other | Admitting: Internal Medicine

## 2014-07-23 ENCOUNTER — Encounter: Payer: Self-pay | Admitting: Internal Medicine

## 2014-07-23 VITALS — BP 130/74 | HR 60 | Temp 98.1°F | Ht 68.0 in | Wt 165.2 lb

## 2014-07-23 DIAGNOSIS — E785 Hyperlipidemia, unspecified: Secondary | ICD-10-CM

## 2014-07-23 DIAGNOSIS — Z0000 Encounter for general adult medical examination without abnormal findings: Secondary | ICD-10-CM

## 2014-07-23 DIAGNOSIS — Z23 Encounter for immunization: Secondary | ICD-10-CM

## 2014-07-23 DIAGNOSIS — Z125 Encounter for screening for malignant neoplasm of prostate: Secondary | ICD-10-CM

## 2014-07-23 DIAGNOSIS — E119 Type 2 diabetes mellitus without complications: Secondary | ICD-10-CM

## 2014-07-23 DIAGNOSIS — M10271 Drug-induced gout, right ankle and foot: Secondary | ICD-10-CM | POA: Diagnosis not present

## 2014-07-23 DIAGNOSIS — I10 Essential (primary) hypertension: Secondary | ICD-10-CM | POA: Diagnosis not present

## 2014-07-23 DIAGNOSIS — B1920 Unspecified viral hepatitis C without hepatic coma: Secondary | ICD-10-CM

## 2014-07-23 LAB — LIPID PANEL
Cholesterol: 143 mg/dL (ref 0–200)
HDL: 53.4 mg/dL (ref >39.00)
LDL Cholesterol: 71 mg/dL (ref 0–99)
NonHDL: 89.6
TRIGLYCERIDES: 95 mg/dL (ref 0.0–149.0)
Total CHOL/HDL Ratio: 3
VLDL: 19 mg/dL (ref 0.0–40.0)

## 2014-07-23 LAB — BASIC METABOLIC PANEL
BUN: 22 mg/dL (ref 6–23)
CHLORIDE: 100 meq/L (ref 96–112)
CO2: 28 meq/L (ref 19–32)
CREATININE: 1.08 mg/dL (ref 0.40–1.50)
Calcium: 9.5 mg/dL (ref 8.4–10.5)
GFR: 89.63 mL/min (ref >60.00)
Glucose, Bld: 148 mg/dL — ABNORMAL HIGH (ref 70–99)
POTASSIUM: 4.3 meq/L (ref 3.5–5.1)
Sodium: 133 mEq/L — ABNORMAL LOW (ref 135–145)

## 2014-07-23 LAB — ALT: ALT: 74 U/L — ABNORMAL HIGH (ref 0–53)

## 2014-07-23 LAB — HEMOGLOBIN A1C: HEMOGLOBIN A1C: 7.1 % — AB (ref 4.6–6.5)

## 2014-07-23 LAB — URIC ACID: URIC ACID, SERUM: 6.3 mg/dL (ref 4.0–7.8)

## 2014-07-23 LAB — AST: AST: 60 U/L — AB (ref 0–37)

## 2014-07-23 LAB — PSA: PSA: 3.41 ng/mL (ref 0.10–4.00)

## 2014-07-23 NOTE — Assessment & Plan Note (Addendum)
Status post harvoni, finished 4 weeks ago. Since the last time he was here ultrasound of the testicles for evaluation of AFP  was negative Patient is currently at the transplant list at [facility] as [facility] he is s/p TACE and microwave treatment

## 2014-07-23 NOTE — Progress Notes (Signed)
Pre visit review using our clinic review tool, if applicable. No additional management support is needed unless otherwise documented below in the visit note. 

## 2014-07-23 NOTE — Assessment & Plan Note (Addendum)
Td 2014 Had a flu shot pneumonia shot 2007-2014 prevnar-- today   DRE normal, check a psa  Cscope 5-10, diverticuli, next in 10 years Diet-exercise discussed  Labs

## 2014-07-23 NOTE — Assessment & Plan Note (Signed)
On glimepiride, check A1c

## 2014-07-23 NOTE — Assessment & Plan Note (Signed)
Currently on no medications, check FLP

## 2014-07-23 NOTE — Assessment & Plan Note (Addendum)
Was seen with symptoms of gout 11 2015, symptoms resolved, but called 06-08-14 w/ "another flare up" and still has mild lingering soreness at the foot. No redness or swelling. Uric acid was slightly elevated, currently on allopurinol. He finished  harvoni 4 weeks ago, patient was told that would be okay to take colchicine if needed. Plan: Check a uric acid, decrease allopurinol dose if appropriate

## 2014-07-23 NOTE — Progress Notes (Signed)
Subjective:    Patient ID: EBB CARELOCK, male    DOB: 1956-01-29, 59 y.o.   MRN: [id number]  DOS:  07/23/2014 Type of visit - description :  Here for Medicare AWV:  1. Risk factors based on Past M, S, F history: reviewed 2. Physical Activities:  Yoga qd, weights, cardio qd 3. Depression/mood: neg screening  4. Hearing:  No problems noted or reported  5. ADL's: independent, drives  6. Fall Risk: no recent falls, prevention discussed  7. home Safety: does feel safe at home  8. Height, weight, & visual acuity: see VS, to see  eye doctor at the [facility] [place] 9. Counseling: provided 10. Labs ordered based on risk factors: if needed  11. Referral Coordination: if needed 12. Care Plan, see assessment and plan , written personalized plan provided  13. Cognitive Assessment: motor skills and cognition appropriate for age 54. Care team updated: sees GI @ [facility] and doctors at the [place]   In addition, today we discussed the following:  Hepatitis C, finished treatment with harvoni, feeling well Hypertension, ambulatory BPs 110/62. Good compliance with medication Was seen with gout few months ago, no further events.  Review of Systems  Constitutional: No fever, chills. No unexplained wt changes. No unusual sweats HEENT: No dental problems, ear discharge, facial swelling, voice changes. No eye discharge, redness or intolerance to light Occasional nasal congestion with small amount of discharge, for the last few days. Respiratory: No wheezing or difficulty breathing. No cough , mucus production Cardiovascular: No CP, leg swelling or palpitations GI: no nausea, vomiting, diarrhea or abdominal pain.  No blood in the stools. No dysphagia   Endocrine: No polyphagia, polyuria or polydipsia GU: No dysuria, gross hematuria, difficulty urinating. No urinary urgency or frequency. Musculoskeletal: No joint swellings or unusual aches or pains Skin: No change in the color of the skin, palor or rash Allergic,  immunologic: No environmental allergies or food allergies Neurological:  No headaches. No diplopia, slurred speech, motor deficits, facial numbness. Felt dizzy a couple of times, he check his blood sugar and BP at the time and they were normal. Was prescribed terazosin at the [place] and not making very dizzy, not taking that anymore. Hematological: No enlarged lymph nodes, easy bruising or bleeding Psychiatry: No suicidal ideas, hallucinations, behavior problems or confusion. No unusual/severe anxiety or depression.      Past Medical History  Diagnosis Date  . 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 Hyperlipidemia   . Hypertension   . History of sarcoidosis     used steroids x 10 years  . 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 Hep C w/o coma, chronic     bx aprox 12-10, s/p Harvoni, on a transplant list candidate   . Benign prostatic hypertrophy   . Uveitis     h/o    Past Surgical History  Procedure Laterality Date  . Lung resection aspergilloma  2000    History   Social History  . Marital Status: Married    Spouse Name: N/A  . Number of Children: 0  . Years of Education: N/A   Occupational History  . retired Nature conservation officer    Social History Main Topics  . Smoking status: Former Research scientist (life sciences)  . Smokeless tobacco: Never Used     Comment: remote smoker  . Alcohol Use: Yes     Comment: rarely   . Drug  Use: No  . Sexual Activity: Not on file   Other Topics Concern  . Not on file   Social History Narrative   Mother- Lance Bosch one of my patients, deceased ~ 09/09/11   Married, No children         Medication List       This list is accurate as of: 07/23/14  6:23 PM.  Always use your most recent med list.               allopurinol 100 MG tablet  Commonly known as:  ZYLOPRIM  Take 1 tablet (100 mg total) by mouth daily.     amLODipine 10 MG tablet  Commonly known as:  NORVASC  TAKE ONE TABLET  BY MOUTH ONCE DAILY     aspirin 81 MG tablet  Take 81 mg by mouth daily.     benazepril 20 MG tablet  Commonly known as:  LOTENSIN  TAKE ONE TABLET BY MOUTH ONCE DAILY     CALCIUM PO  Take 1 tablet by mouth daily.     cholecalciferol 1000 UNITS tablet  Commonly known as:  VITAMIN D  Take 1,000 Units by mouth daily.     COQ-10 PO  Take 1 tablet by mouth daily. 60mg      fluticasone 50 MCG/ACT nasal spray  Commonly known as:  FLONASE  USE TWO SPRAYS IN EACH NOSTRIL EVERY DAY     glimepiride 2 MG tablet  Commonly known as:  AMARYL  Take 1 mg by mouth 2 (two) times daily.     glucose blood test strip  Commonly known as:  ONE TOUCH TEST STRIPS  Use as instructed. Check blood sugar twice daily.     multivitamin tablet  Take 1 tablet by mouth daily.     sildenafil 100 MG tablet  Commonly known as:  VIAGRA  Take 50 mg by mouth daily as needed for erectile dysfunction.           Objective:   Physical Exam BP 130/74 mmHg  Pulse 60  Temp(Src) 98.1 F (36.7 C) (Oral)  Ht 5\' 8"  (1.727 m)  Wt 165 lb 4 oz (74.957 kg)  BMI 25.13 kg/m2  SpO2 99%  General:   Well developed, well nourished . NAD.  Neck:  Full range of motion. Supple. No  thyromegaly , normal carotid pulse, no LAD. HEENT:  Normocephalic . Face symmetric, atraumatic Lungs:  CTA B Normal respiratory effort, no intercostal retractions, no accessory muscle use. Heart: RRR,  no murmur.  Abdomen:  Not distended, soft, non-tender. No rebound or rigidity. No mass,organomegaly Muscle skeletal: no pretibial edema bilaterally  Skin: Exposed areas without rash. Not pale. Not jaundice Rectal:  External abnormalities: none. Normal sphincter tone. No rectal masses or tenderness.  Stool brown  Prostate: Prostate gland firm and smooth, no enlargement, nodularity, tenderness, mass, asymmetry or induration.   Neurologic:  alert & oriented X3.  Speech normal, gait appropriate for age and unassisted Strength  symmetric and appropriate for age.  Psych: Cognition and judgment appear intact.  Cooperative with normal attention span and concentration.  Behavior appropriate. No anxious or depressed appearing.       Assessment & Plan:

## 2014-07-23 NOTE — Assessment & Plan Note (Addendum)
Continue amlodipine and lotensin, BPs seem to be well-controlled, check labs

## 2014-07-23 NOTE — Patient Instructions (Signed)
Get your blood work before you leave   Please come back to the office 4 months  for a routine check up     [place] cause injuries and can affect all age groups. It is possible to use preventive measures to significantly decrease the likelihood of falls. There are many simple measures which can make your home safer and prevent falls. OUTDOORS  Repair cracks and edges of walkways and driveways.  Remove high doorway thresholds.  Trim shrubbery on the main path into your home.  Have good outside lighting.  Clear walkways of tools, rocks, debris, and clutter.  Check that handrails are not broken and are securely fastened. Both sides of steps should have handrails.  Have leaves, snow, and ice cleared regularly.  Use sand or salt on walkways during winter months.  In the garage, clean up grease or oil spills. BATHROOM  Install night lights.  Install grab bars by the toilet and in the tub and shower.  Use non-skid mats or decals in the tub or shower.  Place a plastic non-slip stool in the shower to sit on, if needed.  Keep floors dry and clean up all water on the floor immediately.  Remove soap buildup in the tub or shower on a regular basis.  Secure bath mats with non-slip, double-sided rug tape.  Remove throw rugs and tripping hazards from the floors. BEDROOMS  Install night lights.  Make sure a bedside light is easy to reach.  Do not use oversized bedding.  Keep a telephone by your bedside.  Have a firm chair with side arms to use for getting dressed.  Remove throw rugs and tripping hazards from the floor. KITCHEN  Keep handles on pots and pans turned toward the center of the stove. Use back burners when possible.  Clean up spills quickly and allow time for drying.  Avoid walking on wet floors.  Avoid hot utensils and knives.  Position shelves so they are not too high or low.  Place commonly used objects within easy  reach.  If necessary, use a sturdy step stool with a grab bar when reaching.  Keep electrical cables out of the way.  Do not use floor polish or wax that makes floors slippery. If you must use wax, use non-skid floor wax.  Remove throw rugs and tripping hazards from the floor. STAIRWAYS  Never leave objects on stairs.  Place handrails on both sides of stairways and use them. Fix any loose handrails. Make sure handrails on both sides of the stairways are as long as the stairs.  Check carpeting to make sure it is firmly attached along stairs. Make repairs to worn or loose carpet promptly.  Avoid placing throw rugs at the top or bottom of stairways, or properly secure the rug with carpet tape to prevent slippage. Get rid of throw rugs, if possible.  Have an electrician put in a light switch at the top and bottom of the stairs. OTHER FALL PREVENTION TIPS  Wear low-heel or rubber-soled shoes that are supportive and fit well. Wear closed toe shoes.  When using a stepladder, make sure it is fully opened and both spreaders are firmly locked. Do not climb a closed stepladder.  Add color or contrast paint or tape to grab bars and handrails in your home. Place contrasting color strips on first and last steps.  Learn and use mobility aids as needed. Install an electrical emergency response system.  Turn on lights to avoid  dark areas. Replace light bulbs that burn out immediately. Get light switches that glow.  Arrange furniture to create clear pathways. Keep furniture in the same place.  Firmly attach carpet with non-skid or double-sided tape.  Eliminate uneven floor surfaces.  Select a carpet pattern that does not visually hide the edge of steps.  Be aware of all pets. OTHER HOME SAFETY TIPS  Set the water temperature for 120 F (48.8 C).  Keep emergency numbers on or near the telephone.  Keep smoke detectors on every level of the home and near sleeping areas. Document Released:  05/12/2002 Document Revised: 11/21/2011 Document Reviewed: 08/11/2011 [facility] Patient Information 2015 [place], [place]. This information is not intended to replace advice given to you by your health care provider. Make sure you discuss any questions you have with your health care provider.   Preventive Care for Adults Ages 12 and over  Blood pressure check.** / Every 1 to 2 years.  Lipid and cholesterol check.**/ Every 5 years beginning at age 61.  Lung cancer screening. / Every year if you are aged 61-72 years and have a 30-pack-year history of smoking and currently smoke or have quit within the past 15 years. Yearly screening is stopped once you have quit smoking for at least 15 years or develop a health problem that would prevent you from having lung cancer treatment.  Fecal occult blood test (FOBT) of stool. / Every year beginning at age 31 and continuing until age 61. You may not have to do this test if you get a colonoscopy every 10 years.  Flexible sigmoidoscopy** or colonoscopy.** / Every 5 years for a flexible sigmoidoscopy or every 10 years for a colonoscopy beginning at age 61 and continuing until age 61.  Hepatitis C blood test.** / For all people born from [birth year] through 1967 and any individual with known risks for hepatitis C.  Abdominal aortic aneurysm (AAA) screening.** / A one-time screening for ages 61 to 61 years who are current or former smokers.  Skin self-exam. / Monthly.  Influenza vaccine. / Every year.  Tetanus, diphtheria, and acellular pertussis (Tdap/Td) vaccine.** / 1 dose of Td every 10 years.  Varicella vaccine.** / Consult your health care provider.  Zoster vaccine.** / 1 dose for adults aged 61 years or older.  Pneumococcal 13-valent conjugate (PCV13) vaccine.** / Consult your health care provider.  Pneumococcal polysaccharide (PPSV23) vaccine.** / 1 dose for all adults aged 10 years and older.  Meningococcal vaccine.** / Consult your health care  provider.  Hepatitis A vaccine.** / Consult your health care provider.  Hepatitis B vaccine.** / Consult your health care provider.  Haemophilus influenzae type b (Hib) vaccine.** / Consult your health care provider. **Family history and personal history of risk and conditions may change your health care provider's recommendations. Document Released: 07/18/2001 Document Revised: 05/27/2013 Document Reviewed: 10/17/2010 [facility] Patient Information 2015 [place], [place]. This information is not intended to replace advice given to you by your health care provider. Make sure you discuss any questions you have with your health care provider.

## 2014-07-24 ENCOUNTER — Telehealth: Payer: Self-pay | Admitting: Internal Medicine

## 2014-07-24 DIAGNOSIS — B1920 Unspecified viral hepatitis C without hepatic coma: Secondary | ICD-10-CM

## 2014-07-24 MED ORDER — GLIMEPIRIDE 2 MG PO TABS: 2.0000 mg | ORAL_TABLET | Freq: Two times a day (BID) | ORAL | Status: DC

## 2014-07-24 NOTE — Telephone Encounter (Signed)
Diabetes needs better control. LFTs are elevated compared to the last time, the one thing he is doing different is taking allopurinol. PSA ?increased velocity Plan:  Increase glimepiride from 2 mg to 4 mg and watch for low blood sugar symptoms D/c allopurinol, watch diet Recheck LFTs in one month and fax these results to his liver doctors. [place] a PSA 6 months  --- All of the above discussed with the patient, LFTs orders in.  Kaylyn-- please fax results to his liver doctors

## 2014-07-24 NOTE — Telephone Encounter (Signed)
FYI

## 2014-07-24 NOTE — Telephone Encounter (Signed)
Caller name: Orlo, Brickle Relation to pt: self  Call back number: [phone]   Reason for call:  Pt returning Dr. Larose Kells call regarding lab results. Please call home # [phone]

## 2014-07-27 NOTE — Telephone Encounter (Signed)
Results faxed to [facility] and Dr. Claudia Pollock.

## 2014-07-29 DIAGNOSIS — Z7982 Long term (current) use of aspirin: Secondary | ICD-10-CM | POA: Diagnosis not present

## 2014-07-29 DIAGNOSIS — I10 Essential (primary) hypertension: Secondary | ICD-10-CM | POA: Diagnosis not present

## 2014-07-29 DIAGNOSIS — Z01818 Encounter for other preprocedural examination: Secondary | ICD-10-CM | POA: Diagnosis not present

## 2014-07-29 DIAGNOSIS — I2510 Atherosclerotic heart disease of native coronary artery without angina pectoris: Secondary | ICD-10-CM | POA: Diagnosis not present

## 2014-07-29 DIAGNOSIS — E785 Hyperlipidemia, unspecified: Secondary | ICD-10-CM | POA: Diagnosis not present

## 2014-07-29 DIAGNOSIS — Z7951 Long term (current) use of inhaled steroids: Secondary | ICD-10-CM | POA: Diagnosis not present

## 2014-07-29 DIAGNOSIS — M109 Gout, unspecified: Secondary | ICD-10-CM | POA: Diagnosis not present

## 2014-07-29 DIAGNOSIS — B1920 Unspecified viral hepatitis C without hepatic coma: Secondary | ICD-10-CM | POA: Diagnosis not present

## 2014-07-29 DIAGNOSIS — D869 Sarcoidosis, unspecified: Secondary | ICD-10-CM | POA: Diagnosis not present

## 2014-07-29 DIAGNOSIS — Z01810 Encounter for preprocedural cardiovascular examination: Secondary | ICD-10-CM | POA: Diagnosis not present

## 2014-07-29 DIAGNOSIS — Z79899 Other long term (current) drug therapy: Secondary | ICD-10-CM | POA: Diagnosis not present

## 2014-07-29 DIAGNOSIS — E119 Type 2 diabetes mellitus without complications: Secondary | ICD-10-CM | POA: Diagnosis not present

## 2014-07-29 DIAGNOSIS — C220 Liver cell carcinoma: Secondary | ICD-10-CM | POA: Diagnosis not present

## 2014-07-29 DIAGNOSIS — K7460 Unspecified cirrhosis of liver: Secondary | ICD-10-CM | POA: Diagnosis not present

## 2014-08-01 ENCOUNTER — Other Ambulatory Visit: Payer: Self-pay | Admitting: Internal Medicine

## 2014-08-10 DIAGNOSIS — B182 Chronic viral hepatitis C: Secondary | ICD-10-CM | POA: Diagnosis not present

## 2014-08-24 DIAGNOSIS — K7460 Unspecified cirrhosis of liver: Secondary | ICD-10-CM | POA: Diagnosis not present

## 2014-08-24 DIAGNOSIS — E119 Type 2 diabetes mellitus without complications: Secondary | ICD-10-CM | POA: Diagnosis not present

## 2014-08-24 DIAGNOSIS — I10 Essential (primary) hypertension: Secondary | ICD-10-CM | POA: Diagnosis not present

## 2014-08-24 DIAGNOSIS — C220 Liver cell carcinoma: Secondary | ICD-10-CM | POA: Diagnosis not present

## 2014-09-02 ENCOUNTER — Other Ambulatory Visit: Payer: Self-pay | Admitting: Internal Medicine

## 2014-09-03 ENCOUNTER — Telehealth: Payer: Self-pay | Admitting: Internal Medicine

## 2014-09-03 NOTE — Telephone Encounter (Signed)
  Advise patient: Patient was advised to increase glimepiride and discontinue allopurinol, please check compliance. Due for recheck LFTs, please arrange for a AST, ALT --- dx increased LFTs

## 2014-09-03 NOTE — Telephone Encounter (Signed)
Pt informed via MyChart and letter printed and mailed to Pt, informing him he is due for LFT's recheck. Informed him to call office at earliest convenience to schedule a non-fasting lab appointment. AST, ALT previously ordered.

## 2014-09-17 DIAGNOSIS — B182 Chronic viral hepatitis C: Secondary | ICD-10-CM | POA: Diagnosis not present

## 2014-10-06 DIAGNOSIS — B182 Chronic viral hepatitis C: Secondary | ICD-10-CM | POA: Diagnosis not present

## 2014-10-06 DIAGNOSIS — K7469 Other cirrhosis of liver: Secondary | ICD-10-CM | POA: Diagnosis not present

## 2014-11-20 DIAGNOSIS — E119 Type 2 diabetes mellitus without complications: Secondary | ICD-10-CM | POA: Diagnosis not present

## 2014-11-20 DIAGNOSIS — C220 Liver cell carcinoma: Secondary | ICD-10-CM | POA: Diagnosis not present

## 2014-11-20 DIAGNOSIS — N400 Enlarged prostate without lower urinary tract symptoms: Secondary | ICD-10-CM | POA: Diagnosis not present

## 2014-11-20 DIAGNOSIS — K7460 Unspecified cirrhosis of liver: Secondary | ICD-10-CM | POA: Diagnosis not present

## 2014-11-20 DIAGNOSIS — E785 Hyperlipidemia, unspecified: Secondary | ICD-10-CM | POA: Diagnosis not present

## 2014-11-20 DIAGNOSIS — B1920 Unspecified viral hepatitis C without hepatic coma: Secondary | ICD-10-CM | POA: Diagnosis not present

## 2014-11-20 DIAGNOSIS — D869 Sarcoidosis, unspecified: Secondary | ICD-10-CM | POA: Diagnosis not present

## 2014-11-20 DIAGNOSIS — M810 Age-related osteoporosis without current pathological fracture: Secondary | ICD-10-CM | POA: Diagnosis not present

## 2014-11-25 ENCOUNTER — Encounter: Payer: Self-pay | Admitting: Internal Medicine

## 2014-11-25 ENCOUNTER — Ambulatory Visit (INDEPENDENT_AMBULATORY_CARE_PROVIDER_SITE_OTHER): Payer: Medicare Other | Admitting: Internal Medicine

## 2014-11-25 VITALS — BP 118/72 | HR 63 | Temp 98.5°F | Ht 68.0 in | Wt 166.5 lb

## 2014-11-25 DIAGNOSIS — E119 Type 2 diabetes mellitus without complications: Secondary | ICD-10-CM

## 2014-11-25 DIAGNOSIS — R972 Elevated prostate specific antigen [PSA]: Secondary | ICD-10-CM

## 2014-11-25 DIAGNOSIS — R0981 Nasal congestion: Secondary | ICD-10-CM

## 2014-11-25 DIAGNOSIS — B1920 Unspecified viral hepatitis C without hepatic coma: Secondary | ICD-10-CM | POA: Diagnosis not present

## 2014-11-25 DIAGNOSIS — M10271 Drug-induced gout, right ankle and foot: Secondary | ICD-10-CM

## 2014-11-25 LAB — HEMOGLOBIN A1C: HEMOGLOBIN A1C: 6.3 % (ref 4.6–6.5)

## 2014-11-25 MED ORDER — FLUTICASONE PROPIONATE 50 MCG/ACT NA SUSP: 2.0000 | Freq: Every day | NASAL | Status: DC

## 2014-11-25 NOTE — Progress Notes (Signed)
Subjective:    Patient ID: Jeffery York, male    DOB: 06-18-1955, 59 y.o.   MRN: [id number]  DOS:  11/25/2014 Type of visit - description : Follow-up from previous visit Interval history: In general feeling well, physically and emotionally. Good compliance with medications. Since the last time he was here, he learned that the hepatitis C has not been cured and the AFP levels are increasing.   Review of Systems Occasionally has low blood sugar symptoms, usually after exercise or if he eats late. No syncope, just tiredness, no CBGs done at that time. No pains consistent with gout since the last visit Ambulatory BPs are normal.   He does have occasional urinary frequency  Past Medical History  Diagnosis Date  . 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 Hyperlipidemia   . Hypertension   . History of sarcoidosis     used steroids x 10 years  . 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 Hep C w/o coma, chronic     bx aprox 12-10, s/p Harvoni, on a transplant list candidate   . Benign prostatic hypertrophy   . Uveitis     h/o  . Cirrhosis   . [place] (hepatocellular carcinoma)     s/p TACE and microwave ablation    Past Surgical History  Procedure Laterality Date  . Lung resection aspergilloma  1998-09-28    History   Social History  . Marital Status: Married    Spouse Name: N/A  . Number of Children: 0  . Years of Education: N/A   Occupational History  . retired Nature conservation officer    Social History Main Topics  . Smoking status: Former Research scientist (life sciences)  . Smokeless tobacco: Never Used     Comment: remote smoker  . Alcohol Use: Yes     Comment: rarely   . Drug Use: No  . Sexual Activity: Not on file   Other Topics Concern  . Not on file   Social History Narrative   Mother- Lance Bosch one of my patients, deceased ~ Sep 28, 2011   Married, No children         Medication List       This list is accurate as of:  11/25/14 11:59 PM.  Always use your most recent med list.               amLODipine 10 MG tablet  Commonly known as:  NORVASC  Take 1 tablet (10 mg total) by mouth daily.     aspirin 81 MG tablet  Take 81 mg by mouth daily.     benazepril 20 MG tablet  Commonly known as:  LOTENSIN  TAKE 1 TABLET DAILY     CALCIUM PO  Take 1 tablet by mouth daily.     cholecalciferol 1000 UNITS tablet  Commonly known as:  VITAMIN D  Take 1,000 Units by mouth daily.     COQ-10 PO  Take 1 tablet by mouth daily. 60mg      fluticasone 50 MCG/ACT nasal spray  Commonly known as:  FLONASE  Place 2 sprays into both nostrils daily.     glimepiride 2 MG tablet  Commonly known as:  AMARYL  Take 1 tablet (2 mg total) by mouth 2 (two) times daily.     glucose blood test strip  Commonly known as:  ONE TOUCH TEST STRIPS  Use as instructed. Check blood sugar  twice daily.     multivitamin tablet  Take 1 tablet by mouth daily.     sildenafil 100 MG tablet  Commonly known as:  VIAGRA  Take 50 mg by mouth daily as needed for erectile dysfunction.           Objective:   Physical Exam BP 118/72 mmHg  Pulse 63  Temp(Src) 98.5 F (36.9 C) (Oral)  Ht 5\' 8"  (1.727 m)  Wt 166 lb 8 oz (75.524 kg)  BMI 25.32 kg/m2  SpO2 97%  General:   Well developed, well nourished . NAD.  HEENT:  Normocephalic . Face symmetric, atraumatic Lungs:  CTA B Normal respiratory effort, no intercostal retractions, no accessory muscle use. Heart: RRR,  no murmur.  No pretibial edema bilaterally  Skin: Not pale. Not jaundice Neurologic:  alert & oriented X3.  Speech normal, gait appropriate for age and unassisted Psych--  Cognition and judgment appear intact.  Cooperative with normal attention span and concentration.  Behavior appropriate. No anxious or depressed appearing.       Assessment & Plan:

## 2014-11-25 NOTE — Progress Notes (Signed)
Pre visit review using our clinic review tool, if applicable. No additional management support is needed unless otherwise documented below in the visit note. 

## 2014-11-25 NOTE — Patient Instructions (Signed)
Go to the lab.

## 2014-11-26 DIAGNOSIS — R972 Elevated prostate specific antigen [PSA]: Secondary | ICD-10-CM | POA: Insufficient documentation

## 2014-11-26 NOTE — Assessment & Plan Note (Signed)
Gout, Last LFTs in this office a slightly elevated allopurinol was discontinued, he remains asymptomatic. Recommend to watch diet

## 2014-11-26 NOTE — Assessment & Plan Note (Signed)
Since the last visit, he learned Harvoni failed to cure hepatitis C. Also, he has a history of hepatic cancer, liver images were okay but AFP is increasing. Both problems  are been taking care of by the hepatitis service at [facility], they are planning to repeat that chemoembolization for hepatic cancer and he remains in the transplant list

## 2014-11-26 NOTE — Assessment & Plan Note (Signed)
Diabetes, Good compliance with glimepiride, occasionally symptoms of low blood sugar, otherwise blood sugar is 90-110. Plan: Check A1c, try to prevent low blood sugars and also carry glucose with him.

## 2014-11-26 NOTE — Assessment & Plan Note (Signed)
Elevated PSA velocity? He does have some urinary frequency, not a new problem, in the past the [place] prescribed 2 different drugs including Flomax which did not help much. Plan: Recheck a PSA when he comes back in 3-4 months

## 2014-11-30 ENCOUNTER — Other Ambulatory Visit: Payer: Self-pay

## 2014-11-30 DIAGNOSIS — C220 Liver cell carcinoma: Secondary | ICD-10-CM | POA: Diagnosis not present

## 2014-12-16 DIAGNOSIS — B182 Chronic viral hepatitis C: Secondary | ICD-10-CM | POA: Diagnosis not present

## 2014-12-16 DIAGNOSIS — C220 Liver cell carcinoma: Secondary | ICD-10-CM | POA: Diagnosis not present

## 2014-12-16 DIAGNOSIS — K7469 Other cirrhosis of liver: Secondary | ICD-10-CM | POA: Diagnosis not present

## 2015-01-07 DIAGNOSIS — C220 Liver cell carcinoma: Secondary | ICD-10-CM | POA: Diagnosis not present

## 2015-01-13 DIAGNOSIS — K868 Other specified diseases of pancreas: Secondary | ICD-10-CM | POA: Diagnosis not present

## 2015-01-13 DIAGNOSIS — K8020 Calculus of gallbladder without cholecystitis without obstruction: Secondary | ICD-10-CM | POA: Diagnosis not present

## 2015-01-13 DIAGNOSIS — C220 Liver cell carcinoma: Secondary | ICD-10-CM | POA: Diagnosis not present

## 2015-02-12 ENCOUNTER — Telehealth: Payer: Self-pay | Admitting: Internal Medicine

## 2015-02-12 MED ORDER — GLIMEPIRIDE 2 MG PO TABS: 2.0000 mg | ORAL_TABLET | Freq: Two times a day (BID) | ORAL | Status: DC

## 2015-02-12 MED ORDER — TADALAFIL 20 MG PO TABS: 10.0000 mg | ORAL_TABLET | ORAL | Status: DC | PRN

## 2015-02-12 NOTE — Telephone Encounter (Signed)
Pt is requesting Rx for Cialis, which is cheaper than Viagra for him. Requesting through [facility]. Please advise.   Glimepiride sent to [facility], #180 and 0 refills. Pt has appt in October 2016.

## 2015-02-12 NOTE — Telephone Encounter (Signed)
Caller name: Linnie Mcglocklin  Relationship to patient: Self   Can be reached: [phone] Pharmacy:  Reason for call: pt is calling in to get a refill on his medication, glimepiride,[facility].   He also states that he would like to get Cialis Rx,  He says that it is now cheaper then Viagra. [facility] over a script. Dr. Larose Kells just need to sign off on it.

## 2015-02-12 NOTE — Telephone Encounter (Signed)
Cialis 20 mg 1/2-1 tab every other day PRN sent to [facility], #30 and 2 refills.

## 2015-02-12 NOTE — Telephone Encounter (Signed)
Okay Cialis 20 mg: Half or one tablet every other day as needed #30, 2 refills

## 2015-02-15 MED ORDER — GLIMEPIRIDE 2 MG PO TABS
2.0000 mg | ORAL_TABLET | Freq: Two times a day (BID) | ORAL | Status: DC
Start: 2015-02-15 — End: 2015-05-07

## 2015-02-15 NOTE — Telephone Encounter (Signed)
Patient states [facility] notified him stating tadalafil (CIALIS) 20 MG tablet and glimepiride (AMARYL) 2 MG tablet need prior auth. Patient states he will run out of glimepiride and would like a few pills sent to retail pharmacy  [facility] [address] ([address] [place] [phone] (Phone) [phone] (Fax)

## 2015-02-15 NOTE — Addendum Note (Signed)
Addended by: Janalee Dane C on: 02/15/2015 12:40 PM   Modules accepted: Orders

## 2015-02-15 NOTE — Telephone Encounter (Signed)
Glimepiride sent to [facility], #60 and 0 refills.

## 2015-02-16 NOTE — Telephone Encounter (Signed)
PA for Cialis initiated. Awaiting determination.   Received message from [employer] that a PA is not required for the Amaryl

## 2015-02-18 NOTE — Telephone Encounter (Signed)
Called and insurance and was advised that the PA for cialis is still in clinical review. I requested an expedited decision for pt. JG//CMA

## 2015-02-18 NOTE — Telephone Encounter (Signed)
Pt said he received msg from [facility] that Cialis was cancelled and "we can't fill Cialis RX bc not on preferred drug list. Please ask medical professional to complete coverage review or write RX for a new medicine." Was prior auth declined? Pt would like RX for Viagra sent in to [facility] if we are not able to get PA for Cialis. Please notify pt of coverage/med he will receive @ [phone] or [phone]

## 2015-02-18 NOTE — Telephone Encounter (Signed)
I have not received any correspondence from insurance regarding the Cialis.

## 2015-03-26 DIAGNOSIS — Z79899 Other long term (current) drug therapy: Secondary | ICD-10-CM | POA: Diagnosis not present

## 2015-03-26 DIAGNOSIS — K7460 Unspecified cirrhosis of liver: Secondary | ICD-10-CM | POA: Diagnosis not present

## 2015-03-26 DIAGNOSIS — E785 Hyperlipidemia, unspecified: Secondary | ICD-10-CM | POA: Diagnosis not present

## 2015-03-26 DIAGNOSIS — M810 Age-related osteoporosis without current pathological fracture: Secondary | ICD-10-CM | POA: Diagnosis not present

## 2015-03-26 DIAGNOSIS — K8080 Other cholelithiasis without obstruction: Secondary | ICD-10-CM | POA: Diagnosis not present

## 2015-03-26 DIAGNOSIS — Z5111 Encounter for antineoplastic chemotherapy: Secondary | ICD-10-CM | POA: Diagnosis not present

## 2015-03-26 DIAGNOSIS — B182 Chronic viral hepatitis C: Secondary | ICD-10-CM | POA: Diagnosis not present

## 2015-03-26 DIAGNOSIS — I10 Essential (primary) hypertension: Secondary | ICD-10-CM | POA: Diagnosis not present

## 2015-03-26 DIAGNOSIS — C228 Malignant neoplasm of liver, primary, unspecified as to type: Secondary | ICD-10-CM | POA: Diagnosis not present

## 2015-03-26 DIAGNOSIS — M109 Gout, unspecified: Secondary | ICD-10-CM | POA: Diagnosis not present

## 2015-03-26 DIAGNOSIS — Z7982 Long term (current) use of aspirin: Secondary | ICD-10-CM | POA: Diagnosis not present

## 2015-03-26 DIAGNOSIS — Z87891 Personal history of nicotine dependence: Secondary | ICD-10-CM | POA: Diagnosis not present

## 2015-03-26 DIAGNOSIS — C220 Liver cell carcinoma: Secondary | ICD-10-CM | POA: Diagnosis not present

## 2015-03-26 DIAGNOSIS — E119 Type 2 diabetes mellitus without complications: Secondary | ICD-10-CM | POA: Diagnosis not present

## 2015-03-29 DIAGNOSIS — K7460 Unspecified cirrhosis of liver: Secondary | ICD-10-CM | POA: Diagnosis not present

## 2015-03-30 ENCOUNTER — Encounter: Payer: Self-pay | Admitting: Internal Medicine

## 2015-03-30 ENCOUNTER — Ambulatory Visit (INDEPENDENT_AMBULATORY_CARE_PROVIDER_SITE_OTHER): Payer: Medicare Other | Admitting: Internal Medicine

## 2015-03-30 VITALS — BP 128/76 | HR 61 | Temp 97.8°F | Ht 68.0 in | Wt 168.0 lb

## 2015-03-30 DIAGNOSIS — I10 Essential (primary) hypertension: Secondary | ICD-10-CM | POA: Diagnosis not present

## 2015-03-30 DIAGNOSIS — E119 Type 2 diabetes mellitus without complications: Secondary | ICD-10-CM

## 2015-03-30 DIAGNOSIS — N401 Enlarged prostate with lower urinary tract symptoms: Secondary | ICD-10-CM

## 2015-03-30 DIAGNOSIS — N400 Enlarged prostate without lower urinary tract symptoms: Secondary | ICD-10-CM | POA: Diagnosis not present

## 2015-03-30 DIAGNOSIS — Z09 Encounter for follow-up examination after completed treatment for conditions other than malignant neoplasm: Secondary | ICD-10-CM | POA: Insufficient documentation

## 2015-03-30 DIAGNOSIS — R972 Elevated prostate specific antigen [PSA]: Secondary | ICD-10-CM | POA: Diagnosis not present

## 2015-03-30 LAB — PSA: PSA: 1.04 ng/mL (ref 0.10–4.00)

## 2015-03-30 LAB — HEMOGLOBIN A1C: Hgb A1c MFr Bld: 6.7 % — ABNORMAL HIGH (ref 4.6–6.5)

## 2015-03-30 MED ORDER — TADALAFIL 5 MG PO TABS: 5.0000 mg | ORAL_TABLET | Freq: Every day | ORAL | Status: DC | PRN

## 2015-03-30 NOTE — Assessment & Plan Note (Signed)
DM: Check A1c HTN: Seems stable BPH, ED: Having nocturia, no other symptoms. Requests Cialis 5 mg daily. Prescription provided Increased PSA velocity? DRE (-)  07-2014: Check a PSA. GI: Patient is high in the transplant list; Follow-up by [facility]. Primary care: Reports he got a flu shot and shingles shot. RTC 4-5 months for a physical

## 2015-03-30 NOTE — Patient Instructions (Addendum)
Get your blood work before you leave    Next visit  for a  physical exam in 4-5 months, fasting    Please schedule an appointment at the front desk

## 2015-03-30 NOTE — Progress Notes (Signed)
Pre visit review using our clinic review tool, if applicable. No additional management support is needed unless otherwise documented below in the visit note. 

## 2015-03-30 NOTE — Progress Notes (Signed)
Subjective:    Patient ID: Jeffery York, male    DOB: 29-Sep-1955, 59 y.o.   MRN: [id number]  DOS:  03/30/2015 Type of visit - description : Routine checkup Interval history: DM: good compliance of medication, recently took steroids for a procedure, CBGs went to 260, now back to normal at around 120. Increased PSA velocity: Due for labs HTN: Good compliance of medication, ambulatory BPs normal Cirrhosis, liver cancer: Patient is in the transplant list. BPH: Nocturia, 3-4 times a day at night. Requests treatment w/ Cialis.   Review of Systems  Denies chest pain or difficulty breathing. No nausea, vomiting, diarrhea. No dysuria, gross hematuria difficulty urinating.  Past Medical History  Diagnosis Date  . 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 Hyperlipidemia   . Hypertension   . History of sarcoidosis     used steroids x 10 years  . 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 Hep C w/o coma, chronic (HCC)     bx aprox 12-10, s/p Harvoni, on a transplant list candidate   . Benign prostatic hypertrophy   . Uveitis     h/o  . Cirrhosis ([place])   . [place] (hepatocellular carcinoma) ([place])     s/p TACE and microwave ablation    Past Surgical History  Procedure Laterality Date  . Lung resection aspergilloma  Sep 15, 1998    Social History   Social History  . Marital Status: Married    Spouse Name: N/A  . Number of Children: 0  . Years of Education: N/A   Occupational History  . retired Nature conservation officer    Social History Main Topics  . Smoking status: Former Research scientist (life sciences)  . Smokeless tobacco: Never Used     Comment: remote smoker  . Alcohol Use: Yes     Comment: rarely   . Drug Use: No  . Sexual Activity: Not on file   Other Topics Concern  . Not on file   Social History Narrative   Mother- Lance Bosch one of my patients, deceased ~ September 15, 2011   Married, No children         Medication List       This list  is accurate as of: 03/30/15  6:32 PM.  Always use your most recent med list.               amLODipine 10 MG tablet  Commonly known as:  NORVASC  Take 1 tablet (10 mg total) by mouth daily.     aspirin 81 MG tablet  Take 81 mg by mouth daily.     benazepril 20 MG tablet  Commonly known as:  LOTENSIN  TAKE 1 TABLET DAILY     CALCIUM PO  Take 1 tablet by mouth daily.     cholecalciferol 1000 UNITS tablet  Commonly known as:  VITAMIN D  Take 1,000 Units by mouth daily.     COQ-10 PO  Take 1 tablet by mouth daily. 60mg      fluticasone 50 MCG/ACT nasal spray  Commonly known as:  FLONASE  Place 2 sprays into both nostrils daily.     glimepiride 2 MG tablet  Commonly known as:  AMARYL  Take 1 tablet (2 mg total) by mouth 2 (two) times daily.     glucose blood test strip  Commonly known as:  ONE TOUCH TEST STRIPS  Use as instructed. Check blood sugar twice daily.  multivitamin tablet  Take 1 tablet by mouth daily.     tadalafil 5 MG tablet  Commonly known as:  CIALIS  Take 1 tablet (5 mg total) by mouth daily as needed for erectile dysfunction.           Objective:   Physical Exam BP 128/76 mmHg  Pulse 61  Temp(Src) 97.8 F (36.6 C) (Oral)  Ht 5\' 8"  (1.727 m)  Wt 168 lb (76.204 kg)  BMI 25.55 kg/m2  SpO2 99% General:   Well developed, well nourished . NAD.  HEENT:  Normocephalic . Face symmetric, atraumatic Lungs:  CTA B Normal respiratory effort, no intercostal retractions, no accessory muscle use. Heart: RRR,  no murmur.  No pretibial edema bilaterally  Skin: Not pale. Not jaundice Neurologic:  alert & oriented X3.  Speech normal, gait appropriate for age and unassisted Psych--  Cognition and judgment appear intact.  Cooperative with normal attention span and concentration.  Behavior appropriate. No anxious or depressed appearing.      Assessment & Plan:   Assessment > DM: Initially 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 then it resurfaced w/o  steroids HTN Hyperlipidemia BPH-- failed Flomax before ED Osteoporosis steroid-induced H/o uveitis Sarcoidosis s/p steroids for 10 years  GI:  f/u at [facility]  --Hep C: S/p harvoni failure >>> transplant candidate --Cirrhosis --Hepatocellular ca, s/p TACE- microwave ablation --> failed, had chemo embolization 03-2015  --Reportedly get hepatitis A-B shots from GI  Plan: DM: Check A1c HTN: Seems stable BPH, ED: Having nocturia, no other symptoms. Requests Cialis 5 mg daily. Prescription provided Increased PSA velocity? DRE (-)  07-2014: Check a PSA. GI: Patient is high in the transplant list; Follow-up by [facility]. Primary care: Reports he got a flu shot and shingles shot. RTC 4-5 months for a physical

## 2015-04-06 HISTORY — PX: LIVER TRANSPLANT: SHX410

## 2015-04-07 DIAGNOSIS — B1710 Acute hepatitis C without hepatic coma: Secondary | ICD-10-CM | POA: Diagnosis not present

## 2015-04-07 DIAGNOSIS — Z452 Encounter for adjustment and management of vascular access device: Secondary | ICD-10-CM | POA: Diagnosis not present

## 2015-04-07 DIAGNOSIS — K7210 Chronic hepatic failure without coma: Secondary | ICD-10-CM | POA: Diagnosis not present

## 2015-04-07 DIAGNOSIS — Z01810 Encounter for preprocedural cardiovascular examination: Secondary | ICD-10-CM | POA: Diagnosis not present

## 2015-04-07 DIAGNOSIS — C221 Intrahepatic bile duct carcinoma: Secondary | ICD-10-CM | POA: Diagnosis not present

## 2015-04-07 DIAGNOSIS — Z526 Liver donor: Secondary | ICD-10-CM | POA: Diagnosis not present

## 2015-04-07 DIAGNOSIS — D688 Other specified coagulation defects: Secondary | ICD-10-CM | POA: Diagnosis not present

## 2015-04-07 DIAGNOSIS — R918 Other nonspecific abnormal finding of lung field: Secondary | ICD-10-CM | POA: Diagnosis not present

## 2015-04-07 DIAGNOSIS — J8410 Pulmonary fibrosis, unspecified: Secondary | ICD-10-CM | POA: Diagnosis not present

## 2015-04-07 DIAGNOSIS — D62 Acute posthemorrhagic anemia: Secondary | ICD-10-CM | POA: Diagnosis not present

## 2015-04-07 DIAGNOSIS — B1920 Unspecified viral hepatitis C without hepatic coma: Secondary | ICD-10-CM | POA: Diagnosis not present

## 2015-04-07 DIAGNOSIS — K7290 Hepatic failure, unspecified without coma: Secondary | ICD-10-CM | POA: Diagnosis not present

## 2015-04-08 DIAGNOSIS — E785 Hyperlipidemia, unspecified: Secondary | ICD-10-CM | POA: Diagnosis present

## 2015-04-08 DIAGNOSIS — K7290 Hepatic failure, unspecified without coma: Secondary | ICD-10-CM | POA: Diagnosis present

## 2015-04-08 DIAGNOSIS — C220 Liver cell carcinoma: Secondary | ICD-10-CM | POA: Diagnosis present

## 2015-04-08 DIAGNOSIS — I10 Essential (primary) hypertension: Secondary | ICD-10-CM | POA: Diagnosis present

## 2015-04-08 DIAGNOSIS — Z8249 Family history of ischemic heart disease and other diseases of the circulatory system: Secondary | ICD-10-CM | POA: Diagnosis not present

## 2015-04-08 DIAGNOSIS — M109 Gout, unspecified: Secondary | ICD-10-CM | POA: Diagnosis present

## 2015-04-08 DIAGNOSIS — B182 Chronic viral hepatitis C: Secondary | ICD-10-CM | POA: Diagnosis not present

## 2015-04-08 DIAGNOSIS — M810 Age-related osteoporosis without current pathological fracture: Secondary | ICD-10-CM | POA: Diagnosis present

## 2015-04-08 DIAGNOSIS — Z48815 Encounter for surgical aftercare following surgery on the digestive system: Secondary | ICD-10-CM | POA: Diagnosis not present

## 2015-04-08 DIAGNOSIS — R918 Other nonspecific abnormal finding of lung field: Secondary | ICD-10-CM | POA: Diagnosis not present

## 2015-04-08 DIAGNOSIS — R9431 Abnormal electrocardiogram [ECG] [EKG]: Secondary | ICD-10-CM | POA: Diagnosis not present

## 2015-04-08 DIAGNOSIS — R0989 Other specified symptoms and signs involving the circulatory and respiratory systems: Secondary | ICD-10-CM | POA: Diagnosis not present

## 2015-04-08 DIAGNOSIS — Z944 Liver transplant status: Secondary | ICD-10-CM | POA: Diagnosis not present

## 2015-04-08 DIAGNOSIS — E1165 Type 2 diabetes mellitus with hyperglycemia: Secondary | ICD-10-CM | POA: Diagnosis not present

## 2015-04-08 DIAGNOSIS — D72829 Elevated white blood cell count, unspecified: Secondary | ICD-10-CM | POA: Diagnosis not present

## 2015-04-08 DIAGNOSIS — D869 Sarcoidosis, unspecified: Secondary | ICD-10-CM | POA: Diagnosis present

## 2015-04-08 DIAGNOSIS — J984 Other disorders of lung: Secondary | ICD-10-CM | POA: Diagnosis present

## 2015-04-08 DIAGNOSIS — Z801 Family history of malignant neoplasm of trachea, bronchus and lung: Secondary | ICD-10-CM | POA: Diagnosis not present

## 2015-04-08 DIAGNOSIS — Z4823 Encounter for aftercare following liver transplant: Secondary | ICD-10-CM | POA: Diagnosis not present

## 2015-04-08 DIAGNOSIS — Z0389 Encounter for observation for other suspected diseases and conditions ruled out: Secondary | ICD-10-CM | POA: Diagnosis not present

## 2015-04-08 DIAGNOSIS — Z4682 Encounter for fitting and adjustment of non-vascular catheter: Secondary | ICD-10-CM | POA: Diagnosis not present

## 2015-04-08 DIAGNOSIS — E11649 Type 2 diabetes mellitus with hypoglycemia without coma: Secondary | ICD-10-CM | POA: Diagnosis present

## 2015-04-08 DIAGNOSIS — J952 Acute pulmonary insufficiency following nonthoracic surgery: Secondary | ICD-10-CM | POA: Diagnosis not present

## 2015-04-08 DIAGNOSIS — T380X5A Adverse effect of glucocorticoids and synthetic analogues, initial encounter: Secondary | ICD-10-CM | POA: Diagnosis present

## 2015-04-08 DIAGNOSIS — K7460 Unspecified cirrhosis of liver: Secondary | ICD-10-CM | POA: Diagnosis present

## 2015-04-08 DIAGNOSIS — Z833 Family history of diabetes mellitus: Secondary | ICD-10-CM | POA: Diagnosis not present

## 2015-04-08 DIAGNOSIS — Z87891 Personal history of nicotine dependence: Secondary | ICD-10-CM | POA: Diagnosis not present

## 2015-04-08 DIAGNOSIS — R739 Hyperglycemia, unspecified: Secondary | ICD-10-CM | POA: Diagnosis not present

## 2015-04-08 DIAGNOSIS — Z8505 Personal history of malignant neoplasm of liver: Secondary | ICD-10-CM | POA: Diagnosis not present

## 2015-04-08 DIAGNOSIS — N400 Enlarged prostate without lower urinary tract symptoms: Secondary | ICD-10-CM | POA: Diagnosis present

## 2015-04-08 DIAGNOSIS — Z9225 Personal history of immunosupression therapy: Secondary | ICD-10-CM | POA: Diagnosis not present

## 2015-04-08 DIAGNOSIS — Z7952 Long term (current) use of systemic steroids: Secondary | ICD-10-CM | POA: Diagnosis not present

## 2015-04-08 DIAGNOSIS — D62 Acute posthemorrhagic anemia: Secondary | ICD-10-CM | POA: Diagnosis not present

## 2015-04-13 DIAGNOSIS — R7989 Other specified abnormal findings of blood chemistry: Secondary | ICD-10-CM | POA: Diagnosis not present

## 2015-04-13 DIAGNOSIS — B259 Cytomegaloviral disease, unspecified: Secondary | ICD-10-CM | POA: Diagnosis not present

## 2015-04-13 DIAGNOSIS — Z944 Liver transplant status: Secondary | ICD-10-CM | POA: Diagnosis not present

## 2015-04-13 DIAGNOSIS — Z4823 Encounter for aftercare following liver transplant: Secondary | ICD-10-CM | POA: Diagnosis not present

## 2015-04-13 DIAGNOSIS — Z48298 Encounter for aftercare following other organ transplant: Secondary | ICD-10-CM | POA: Diagnosis not present

## 2015-04-15 DIAGNOSIS — B259 Cytomegaloviral disease, unspecified: Secondary | ICD-10-CM | POA: Diagnosis not present

## 2015-04-15 DIAGNOSIS — Z944 Liver transplant status: Secondary | ICD-10-CM | POA: Diagnosis not present

## 2015-04-15 DIAGNOSIS — Z48298 Encounter for aftercare following other organ transplant: Secondary | ICD-10-CM | POA: Diagnosis not present

## 2015-04-15 DIAGNOSIS — R7989 Other specified abnormal findings of blood chemistry: Secondary | ICD-10-CM | POA: Diagnosis not present

## 2015-04-15 DIAGNOSIS — Z4823 Encounter for aftercare following liver transplant: Secondary | ICD-10-CM | POA: Diagnosis not present

## 2015-04-19 DIAGNOSIS — R7989 Other specified abnormal findings of blood chemistry: Secondary | ICD-10-CM | POA: Diagnosis not present

## 2015-04-19 DIAGNOSIS — Z944 Liver transplant status: Secondary | ICD-10-CM | POA: Diagnosis not present

## 2015-04-19 DIAGNOSIS — B259 Cytomegaloviral disease, unspecified: Secondary | ICD-10-CM | POA: Diagnosis not present

## 2015-04-19 DIAGNOSIS — Z4823 Encounter for aftercare following liver transplant: Secondary | ICD-10-CM | POA: Diagnosis not present

## 2015-04-19 DIAGNOSIS — Z48298 Encounter for aftercare following other organ transplant: Secondary | ICD-10-CM | POA: Diagnosis not present

## 2015-04-22 DIAGNOSIS — Z4823 Encounter for aftercare following liver transplant: Secondary | ICD-10-CM | POA: Diagnosis not present

## 2015-04-22 DIAGNOSIS — Z48298 Encounter for aftercare following other organ transplant: Secondary | ICD-10-CM | POA: Diagnosis not present

## 2015-04-22 DIAGNOSIS — B259 Cytomegaloviral disease, unspecified: Secondary | ICD-10-CM | POA: Diagnosis not present

## 2015-04-22 DIAGNOSIS — Z944 Liver transplant status: Secondary | ICD-10-CM | POA: Diagnosis not present

## 2015-04-22 DIAGNOSIS — R7989 Other specified abnormal findings of blood chemistry: Secondary | ICD-10-CM | POA: Diagnosis not present

## 2015-04-27 DIAGNOSIS — Z944 Liver transplant status: Secondary | ICD-10-CM | POA: Diagnosis not present

## 2015-04-27 DIAGNOSIS — B259 Cytomegaloviral disease, unspecified: Secondary | ICD-10-CM | POA: Diagnosis not present

## 2015-04-27 DIAGNOSIS — Z48298 Encounter for aftercare following other organ transplant: Secondary | ICD-10-CM | POA: Diagnosis not present

## 2015-04-27 DIAGNOSIS — R7989 Other specified abnormal findings of blood chemistry: Secondary | ICD-10-CM | POA: Diagnosis not present

## 2015-04-27 DIAGNOSIS — Z4823 Encounter for aftercare following liver transplant: Secondary | ICD-10-CM | POA: Diagnosis not present

## 2015-04-28 DIAGNOSIS — I10 Essential (primary) hypertension: Secondary | ICD-10-CM | POA: Diagnosis not present

## 2015-04-28 DIAGNOSIS — K738 Other chronic hepatitis, not elsewhere classified: Secondary | ICD-10-CM | POA: Diagnosis not present

## 2015-04-28 DIAGNOSIS — Z7982 Long term (current) use of aspirin: Secondary | ICD-10-CM | POA: Diagnosis not present

## 2015-04-28 DIAGNOSIS — B182 Chronic viral hepatitis C: Secondary | ICD-10-CM | POA: Diagnosis not present

## 2015-04-28 DIAGNOSIS — Z87891 Personal history of nicotine dependence: Secondary | ICD-10-CM | POA: Diagnosis not present

## 2015-04-28 DIAGNOSIS — C220 Liver cell carcinoma: Secondary | ICD-10-CM | POA: Diagnosis not present

## 2015-04-28 DIAGNOSIS — Z7952 Long term (current) use of systemic steroids: Secondary | ICD-10-CM | POA: Diagnosis not present

## 2015-04-28 DIAGNOSIS — Z944 Liver transplant status: Secondary | ICD-10-CM | POA: Diagnosis not present

## 2015-04-28 DIAGNOSIS — R7989 Other specified abnormal findings of blood chemistry: Secondary | ICD-10-CM | POA: Diagnosis not present

## 2015-04-28 DIAGNOSIS — Z79899 Other long term (current) drug therapy: Secondary | ICD-10-CM | POA: Diagnosis not present

## 2015-04-28 DIAGNOSIS — K7460 Unspecified cirrhosis of liver: Secondary | ICD-10-CM | POA: Diagnosis not present

## 2015-04-28 DIAGNOSIS — E785 Hyperlipidemia, unspecified: Secondary | ICD-10-CM | POA: Diagnosis not present

## 2015-04-28 DIAGNOSIS — E119 Type 2 diabetes mellitus without complications: Secondary | ICD-10-CM | POA: Diagnosis not present

## 2015-04-28 DIAGNOSIS — Z794 Long term (current) use of insulin: Secondary | ICD-10-CM | POA: Diagnosis not present

## 2015-05-03 DIAGNOSIS — R7989 Other specified abnormal findings of blood chemistry: Secondary | ICD-10-CM | POA: Diagnosis not present

## 2015-05-03 DIAGNOSIS — Z4823 Encounter for aftercare following liver transplant: Secondary | ICD-10-CM | POA: Diagnosis not present

## 2015-05-03 DIAGNOSIS — Z48298 Encounter for aftercare following other organ transplant: Secondary | ICD-10-CM | POA: Diagnosis not present

## 2015-05-03 DIAGNOSIS — B259 Cytomegaloviral disease, unspecified: Secondary | ICD-10-CM | POA: Diagnosis not present

## 2015-05-03 DIAGNOSIS — Z944 Liver transplant status: Secondary | ICD-10-CM | POA: Diagnosis not present

## 2015-05-04 ENCOUNTER — Other Ambulatory Visit: Payer: Self-pay | Admitting: Internal Medicine

## 2015-05-06 DIAGNOSIS — Z944 Liver transplant status: Secondary | ICD-10-CM | POA: Diagnosis not present

## 2015-05-06 DIAGNOSIS — B182 Chronic viral hepatitis C: Secondary | ICD-10-CM | POA: Diagnosis not present

## 2015-05-06 DIAGNOSIS — Z48298 Encounter for aftercare following other organ transplant: Secondary | ICD-10-CM | POA: Diagnosis not present

## 2015-05-06 DIAGNOSIS — R748 Abnormal levels of other serum enzymes: Secondary | ICD-10-CM | POA: Diagnosis not present

## 2015-05-07 ENCOUNTER — Other Ambulatory Visit: Payer: Self-pay

## 2015-05-07 ENCOUNTER — Encounter: Payer: Self-pay | Admitting: Internal Medicine

## 2015-05-07 ENCOUNTER — Ambulatory Visit (INDEPENDENT_AMBULATORY_CARE_PROVIDER_SITE_OTHER): Payer: Medicare Other | Admitting: Internal Medicine

## 2015-05-07 VITALS — BP 138/78 | HR 86 | Temp 98.2°F | Ht 68.0 in | Wt 167.5 lb

## 2015-05-07 DIAGNOSIS — B1920 Unspecified viral hepatitis C without hepatic coma: Secondary | ICD-10-CM

## 2015-05-07 DIAGNOSIS — E119 Type 2 diabetes mellitus without complications: Secondary | ICD-10-CM

## 2015-05-07 DIAGNOSIS — Z09 Encounter for follow-up examination after completed treatment for conditions other than malignant neoplasm: Secondary | ICD-10-CM | POA: Diagnosis not present

## 2015-05-07 DIAGNOSIS — I10 Essential (primary) hypertension: Secondary | ICD-10-CM

## 2015-05-07 NOTE — Progress Notes (Signed)
Subjective:    Patient ID: Jeffery York, male    DOB: Apr 12, 1956, 59 y.o.   MRN: [id number]  DOS:  05/07/2015 Type of visit - description : Follow-up Interval history: Since the last visit, had a liver transplant. Feeling better every day. He has labs twice a week. His transplant doctor ask him to see me regards diabetes. Medication list reviewed, multiple changes noted, see a/p . Currently on Lantus 30 units, Levemir 10, 12, 12 units. Ambulatory CBGs: Fasting in the morning 200, 120, 154, 107. Daytime CBGs usually in the 130s but since they decrease the dose of prednisone few days ago has been low in the 50s twice.    Review of Systems  Denies chest pain or difficulty breathing. No nausea, vomiting, diarrhea Past Medical History  Diagnosis Date  . 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 Hyperlipidemia   . Hypertension   . History of sarcoidosis     used steroids x 10 years  . 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 Hep C w/o coma, chronic (HCC)     bx aprox 12-10, s/p Harvoni, on a transplant list candidate   . Benign prostatic hypertrophy   . Uveitis     h/o  . Cirrhosis ([place])   . [place] (hepatocellular carcinoma) ([place])     s/p TACE and microwave ablation    Past Surgical History  Procedure Laterality Date  . Lung resection aspergilloma  09-12-98  . 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 Marital Status: Married    Spouse Name: N/A  . Number of Children: 0  . Years of Education: N/A   Occupational History  . retired Nature conservation officer    Social History Main Topics  . Smoking status: Former Research scientist (life sciences)  . Smokeless tobacco: Never Used     Comment: remote smoker  . Alcohol Use: Yes     Comment: rarely   . Drug Use: No  . Sexual Activity: Not on file   Other Topics Concern  . Not on file   Social History Narrative   Mother- Lance Bosch one of my patients, deceased ~  12-Sep-2011   Married, No children         Medication List       This list is accurate as of: 05/07/15 11:59 PM.  Always use your most recent med list.               aspirin 81 MG tablet  Take 81 mg by mouth daily.     B-D ULTRAFINE III SHORT PEN 31G X 8 MM Misc  Generic drug:  Insulin Pen Needle  Use as directed     Elbasvir-Grazoprevir 50-100 MG Tabs  Take 1 tablet by mouth daily.     freestyle lancets  Use as directed     FREESTYLE LITE Devi  Use as directed     glucose blood test strip  Commonly known as:  ONE TOUCH TEST STRIPS  Use as instructed. Check blood sugar twice daily.     insulin lispro 100 UNIT/ML injection  Commonly known as:  HUMALOG  Inject into the skin 3 (three) times daily before meals. 8u  - 10u - 10u     LEVEMIR FLEXTOUCH 100 UNIT/ML Pen  Generic drug:  Insulin Detemir  30 Units daily.     magnesium oxide 400 MG  tablet  Commonly known as:  MAG-OX  Take 400 mg by mouth daily.     predniSONE 5 MG tablet  Commonly known as:  DELTASONE  Take 10 mg by mouth daily.     ribavirin 200 MG capsule  Commonly known as:  REBETOL  Take 200 mg by mouth 2 (two) times daily. Take 3 capsules in the AM and 2 capsules in the PM     Sofosbuvir 400 MG Tabs  Take 400 mg by mouth daily.     sulfamethoxazole-trimethoprim 400-80 MG tablet  Commonly known as:  BACTRIM,SEPTRA  1 tablet by mouth on M, W, F     tacrolimus 1 MG capsule  Commonly known as:  PROGRAF  Take 3 mg by mouth 2 (two) times daily.     valGANciclovir 450 MG tablet  Commonly known as:  VALCYTE  Take 900 mg by mouth daily.           Objective:   Physical Exam BP 138/78 mmHg  Pulse 86  Temp(Src) 98.2 F (36.8 C) (Oral)  Ht 5\' 8"  (1.727 m)  Wt 167 lb 8 oz (75.978 kg)  BMI 25.47 kg/m2  SpO2 96% General:   Well developed, well nourished . NAD.  HEENT:  Normocephalic . Face symmetric, atraumatic Lungs:  CTA B Normal respiratory effort, no intercostal retractions, no accessory  muscle use. Heart: RRR,  no murmur.  no pretibial edema bilaterally  Abdomen:  Not distended, soft, non-tender. No rebound or rigidity. Surgical scar looks appropriate Skin: Not pale. Not jaundice Neurologic:  alert & oriented X3.  Speech normal, gait appropriate for age and unassisted Psych--  Cognition and judgment appear intact.  Cooperative with normal attention span and concentration.  Behavior appropriate. No anxious or depressed appearing.    Assessment & Plan:   Assessment > DM: Initially 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 then it resurfaced w/o steroids HTN Hyperlipidemia BPH-- failed Flomax before ED Osteoporosis steroid-induced H/o uveitis Sarcoidosis s/p steroids for 10 years  GI:  f/u at [facility]  -- Liver transplant  04-07-2015 @ [facility]   --Hep C: S/p harvoni failure   --Cirrhosis --Hepatocellular ca, s/p TACE- microwave ablation --> failed, had chemo embolization 03-2015  --Reportedly get hepatitis A-B shots from GI  Plan: GI: s/p liver transplant, doing great. Restarted rx for hepatitis C to prevent infection of the new liver; gets labs twice a week per transplant team. Medications changes: Dc  amlodipine, benazepril, calcium, vitamin D, glimepiride New medicines: Lantus, Humalog, prednisone, transplant related medicines and hep C related medicines Diabetes: Since prednisone dose was decreased a few days ago, daytime CBGs occasionally low, fasting sugars okay. Plan:  Continue Levemir 30 units daily  Decrease Humalog 03-17-11 to 01-12-09 Bring CBG log in 10 days  HTN: Ambulatory BPs in the 130 and 140 range, occasionally 150. Was recommended by the transplant doctors not to take any new medication for BP for now. Continue monitoring, bring  BP log in 10 days RTC 2 months    Today, I spent more than  25  min with the patient: >50% of the time counseling regards diabetes, correcting his medication list.

## 2015-05-07 NOTE — Progress Notes (Signed)
Pre visit review using our clinic review tool, if applicable. No additional management support is needed unless otherwise documented below in the visit note. 

## 2015-05-07 NOTE — Patient Instructions (Signed)
Decrease Humalog to 8 units, 10 units and 10 units.  Other medications the same  Continue checking blood sugar 4 times a day  Watch for low blood sugars, if more than 2 episodes a week let me know. Carry glucose with you  Check the  blood pressure daily    Be sure your blood pressure is between 110/65 and  145/85.  if it is consistently higher or lower, let me know    Diabetes GOALS: Fasting before a meal 70- 130 2 hours after a meal less than 180 At bedtime 90-150   Next visit  for a 2 months     (30 minutes) Please schedule an appointment at the front desk

## 2015-05-09 NOTE — Assessment & Plan Note (Signed)
GI: s/p liver transplant, doing great. Restarted rx for hepatitis C to prevent infection of the new liver; gets labs twice a week per transplant team. Medications changes: Dc  amlodipine, benazepril, calcium, vitamin D, glimepiride New medicines: Lantus, Humalog, prednisone, transplant related medicines and hep C related medicines Diabetes: Since prednisone dose was decreased a few days ago, daytime CBGs occasionally low, fasting sugars okay. Plan:  Continue Levemir 30 units daily  Decrease Humalog 03-17-11 to 01-12-09 Bring CBG log in 10 days  HTN: Ambulatory BPs in the 130 and 140 range, occasionally 150. Was recommended by the transplant doctors not to take any new medication for BP for now. Continue monitoring, bring  BP log in 10 days RTC 2 months

## 2015-05-10 DIAGNOSIS — Z944 Liver transplant status: Secondary | ICD-10-CM | POA: Diagnosis not present

## 2015-05-10 DIAGNOSIS — Z48298 Encounter for aftercare following other organ transplant: Secondary | ICD-10-CM | POA: Diagnosis not present

## 2015-05-10 DIAGNOSIS — B182 Chronic viral hepatitis C: Secondary | ICD-10-CM | POA: Diagnosis not present

## 2015-05-10 DIAGNOSIS — Z4823 Encounter for aftercare following liver transplant: Secondary | ICD-10-CM | POA: Diagnosis not present

## 2015-05-10 DIAGNOSIS — K7469 Other cirrhosis of liver: Secondary | ICD-10-CM | POA: Diagnosis not present

## 2015-05-15 ENCOUNTER — Other Ambulatory Visit: Payer: Self-pay | Admitting: Internal Medicine

## 2015-05-17 DIAGNOSIS — Z944 Liver transplant status: Secondary | ICD-10-CM | POA: Diagnosis not present

## 2015-05-17 DIAGNOSIS — B259 Cytomegaloviral disease, unspecified: Secondary | ICD-10-CM | POA: Diagnosis not present

## 2015-05-17 DIAGNOSIS — Z48298 Encounter for aftercare following other organ transplant: Secondary | ICD-10-CM | POA: Diagnosis not present

## 2015-05-20 DIAGNOSIS — Z4823 Encounter for aftercare following liver transplant: Secondary | ICD-10-CM | POA: Diagnosis not present

## 2015-05-20 DIAGNOSIS — B1920 Unspecified viral hepatitis C without hepatic coma: Secondary | ICD-10-CM | POA: Diagnosis not present

## 2015-05-20 DIAGNOSIS — Z944 Liver transplant status: Secondary | ICD-10-CM | POA: Diagnosis not present

## 2015-05-20 DIAGNOSIS — R7989 Other specified abnormal findings of blood chemistry: Secondary | ICD-10-CM | POA: Diagnosis not present

## 2015-05-20 DIAGNOSIS — R7879 Finding of abnormal level of heavy metals in blood: Secondary | ICD-10-CM | POA: Diagnosis not present

## 2015-05-20 DIAGNOSIS — K738 Other chronic hepatitis, not elsewhere classified: Secondary | ICD-10-CM | POA: Diagnosis not present

## 2015-05-20 DIAGNOSIS — K7460 Unspecified cirrhosis of liver: Secondary | ICD-10-CM | POA: Diagnosis not present

## 2015-05-24 DIAGNOSIS — B182 Chronic viral hepatitis C: Secondary | ICD-10-CM | POA: Diagnosis not present

## 2015-05-24 DIAGNOSIS — Z48298 Encounter for aftercare following other organ transplant: Secondary | ICD-10-CM | POA: Diagnosis not present

## 2015-05-24 DIAGNOSIS — Z944 Liver transplant status: Secondary | ICD-10-CM | POA: Diagnosis not present

## 2015-05-28 ENCOUNTER — Other Ambulatory Visit: Payer: Self-pay | Admitting: Internal Medicine

## 2015-06-02 DIAGNOSIS — B259 Cytomegaloviral disease, unspecified: Secondary | ICD-10-CM | POA: Diagnosis not present

## 2015-06-02 DIAGNOSIS — Z944 Liver transplant status: Secondary | ICD-10-CM | POA: Diagnosis not present

## 2015-06-02 DIAGNOSIS — Z4823 Encounter for aftercare following liver transplant: Secondary | ICD-10-CM | POA: Diagnosis not present

## 2015-06-09 DIAGNOSIS — Z944 Liver transplant status: Secondary | ICD-10-CM | POA: Diagnosis not present

## 2015-06-09 DIAGNOSIS — Z4823 Encounter for aftercare following liver transplant: Secondary | ICD-10-CM | POA: Diagnosis not present

## 2015-06-09 DIAGNOSIS — R7989 Other specified abnormal findings of blood chemistry: Secondary | ICD-10-CM | POA: Diagnosis not present

## 2015-06-09 DIAGNOSIS — B182 Chronic viral hepatitis C: Secondary | ICD-10-CM | POA: Diagnosis not present

## 2015-06-09 DIAGNOSIS — Z48298 Encounter for aftercare following other organ transplant: Secondary | ICD-10-CM | POA: Diagnosis not present

## 2015-06-09 DIAGNOSIS — B259 Cytomegaloviral disease, unspecified: Secondary | ICD-10-CM | POA: Diagnosis not present

## 2015-06-09 LAB — CBC AND DIFFERENTIAL
HEMOGLOBIN: 13.1 g/dL — AB (ref 13.5–17.5)
Platelets: 35 10*3/uL — AB (ref 150–399)

## 2015-06-14 ENCOUNTER — Encounter: Payer: Self-pay | Admitting: Internal Medicine

## 2015-06-14 ENCOUNTER — Other Ambulatory Visit: Payer: Self-pay

## 2015-06-15 DIAGNOSIS — Z4823 Encounter for aftercare following liver transplant: Secondary | ICD-10-CM | POA: Diagnosis not present

## 2015-06-15 DIAGNOSIS — B259 Cytomegaloviral disease, unspecified: Secondary | ICD-10-CM | POA: Diagnosis not present

## 2015-06-15 DIAGNOSIS — Z944 Liver transplant status: Secondary | ICD-10-CM | POA: Diagnosis not present

## 2015-06-22 DIAGNOSIS — B259 Cytomegaloviral disease, unspecified: Secondary | ICD-10-CM | POA: Diagnosis not present

## 2015-06-22 DIAGNOSIS — Z944 Liver transplant status: Secondary | ICD-10-CM | POA: Diagnosis not present

## 2015-06-22 DIAGNOSIS — Z4823 Encounter for aftercare following liver transplant: Secondary | ICD-10-CM | POA: Diagnosis not present

## 2015-06-28 DIAGNOSIS — Z944 Liver transplant status: Secondary | ICD-10-CM | POA: Diagnosis not present

## 2015-06-28 DIAGNOSIS — Z4823 Encounter for aftercare following liver transplant: Secondary | ICD-10-CM | POA: Diagnosis not present

## 2015-06-28 DIAGNOSIS — B259 Cytomegaloviral disease, unspecified: Secondary | ICD-10-CM | POA: Diagnosis not present

## 2015-07-12 DIAGNOSIS — I10 Essential (primary) hypertension: Secondary | ICD-10-CM | POA: Diagnosis not present

## 2015-07-12 DIAGNOSIS — Z48298 Encounter for aftercare following other organ transplant: Secondary | ICD-10-CM | POA: Diagnosis not present

## 2015-07-12 DIAGNOSIS — Z944 Liver transplant status: Secondary | ICD-10-CM | POA: Diagnosis not present

## 2015-07-12 DIAGNOSIS — C220 Liver cell carcinoma: Secondary | ICD-10-CM | POA: Diagnosis not present

## 2015-07-12 DIAGNOSIS — B182 Chronic viral hepatitis C: Secondary | ICD-10-CM | POA: Diagnosis not present

## 2015-07-12 LAB — BASIC METABOLIC PANEL
BUN: 16 mg/dL (ref 4–21)
CREATININE: 1 mg/dL (ref 0.6–1.3)
Glucose: 188 mg/dL
Potassium: 4.4 mmol/L (ref 3.4–5.3)
Sodium: 141 mmol/L (ref 137–147)

## 2015-07-12 LAB — CBC AND DIFFERENTIAL
HCT: 38 % — AB (ref 41–53)
HEMOGLOBIN: 12.3 g/dL — AB (ref 13.5–17.5)
NEUTROS ABS: 4 /uL
PLATELETS: 243 10*3/uL (ref 150–399)
WBC: 6.1 10*3/mL

## 2015-07-12 LAB — HEPATIC FUNCTION PANEL
ALT: 33 U/L (ref 10–40)
AST: 29 U/L (ref 14–40)
Alkaline Phosphatase: 71 U/L (ref 25–125)
BILIRUBIN DIRECT: 0.2 mg/dL (ref 0.01–0.4)
BILIRUBIN, TOTAL: 1.7 mg/dL

## 2015-07-20 ENCOUNTER — Ambulatory Visit (INDEPENDENT_AMBULATORY_CARE_PROVIDER_SITE_OTHER): Payer: Medicare Other | Admitting: Internal Medicine

## 2015-07-20 ENCOUNTER — Encounter: Payer: Self-pay | Admitting: Internal Medicine

## 2015-07-20 VITALS — BP 126/74 | HR 69 | Temp 98.0°F | Ht 68.0 in | Wt 175.2 lb

## 2015-07-20 DIAGNOSIS — E785 Hyperlipidemia, unspecified: Secondary | ICD-10-CM

## 2015-07-20 DIAGNOSIS — I10 Essential (primary) hypertension: Secondary | ICD-10-CM

## 2015-07-20 DIAGNOSIS — Z944 Liver transplant status: Secondary | ICD-10-CM

## 2015-07-20 DIAGNOSIS — Z794 Long term (current) use of insulin: Secondary | ICD-10-CM

## 2015-07-20 DIAGNOSIS — E118 Type 2 diabetes mellitus with unspecified complications: Secondary | ICD-10-CM

## 2015-07-20 DIAGNOSIS — Z09 Encounter for follow-up examination after completed treatment for conditions other than malignant neoplasm: Secondary | ICD-10-CM

## 2015-07-20 MED ORDER — BENAZEPRIL HCL 10 MG PO TABS: 10.0000 mg | ORAL_TABLET | Freq: Every day | ORAL | Status: DC

## 2015-07-20 NOTE — Progress Notes (Signed)
Subjective:    Patient ID: Jeffery York, male    DOB: 1955-08-19, 60 y.o.   MRN: [id number]  DOS:  07/20/2015 Type of visit - description : Routine office visit Interval history: DM: on Levemir 30 units, Humalog 8-8 - 8. Steroids were discontinued 07/12/2015, since then his blood sugars are as follows:- CBGs in the morning: 102, 108, 127 CBGs 2 hours after lunch high, up to 200s CBGs at bedtime 84, 114.  Hypertension: Ambulatory BPs range from 120-150. Was recommended to go back on benazepril by  his liver doctors High cholesterol: Concern about this; recognizes he has gained weight despite normal physical activity and diet. (Probably related to recent steroids)  Labs from 07/12/2015: Creatinine 0.9, calcium 9.0, AST 29, ALT 33 both normal.  Potassium 4.4. CBC show a hemoglobin of 12.3, platelets 243, WBC 6.1  Review of Systems No chest pain or difficulty breathing No nausea, vomiting, diarrhea. No cough  Past Medical History  Diagnosis Date  . 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 Hyperlipidemia   . Hypertension   . History of sarcoidosis     used steroids x 10 years  . 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 Hep C w/o coma, chronic (HCC)     bx aprox 12-10, s/p Harvoni, on a transplant list candidate   . Benign prostatic hypertrophy   . Uveitis     h/o  . Cirrhosis ([place])   . [place] (hepatocellular carcinoma) ([place])     s/p TACE and microwave ablation    Past Surgical History  Procedure Laterality Date  . 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 Marital Status: Married    Spouse Name: N/A  . Number of Children: 0  . Years of Education: N/A   Occupational History  . retired Nature conservation officer    Social History Main Topics  . Smoking status: Former Research scientist (life sciences)  . Smokeless tobacco: Never Used     Comment: remote smoker  . Alcohol Use:  Yes     Comment: rarely   . Drug Use: No  . Sexual Activity: Not on file   Other Topics Concern  . Not on file   Social History Narrative   Mother- Lance Bosch one of my patients, deceased ~ 09/27/2011   Married, No children         Medication List       This list is accurate as of: 07/20/15  1:20 PM.  Always use your most recent med list.               aspirin 81 MG tablet  Take 81 mg by mouth daily.     B-D ULTRAFINE III SHORT PEN 31G X 8 MM Misc  Generic drug:  Insulin Pen Needle  Reported on 07/20/2015     benazepril 10 MG tablet  Commonly known as:  LOTENSIN  Take 1 tablet (10 mg total) by mouth daily.     Elbasvir-Grazoprevir 50-100 MG Tabs  Take 1 tablet by mouth daily.     freestyle lancets  Reported on 07/20/2015     FREESTYLE LITE Devi  Reported on 07/20/2015     glucose blood test strip  Commonly known as:  ONE TOUCH TEST STRIPS  Use as instructed. Check blood sugar twice daily.  insulin lispro 100 UNIT/ML injection  Commonly known as:  HUMALOG  Inject into the skin. 8--12--6 units     LEVEMIR FLEXTOUCH 100 UNIT/ML Pen  Generic drug:  Insulin Detemir  30 Units daily.     magnesium oxide 400 MG tablet  Commonly known as:  MAG-OX  Take 400 mg by mouth daily.     ribavirin 200 MG capsule  Commonly known as:  REBETOL  Take 3 capsules in the AM and 3 capsules in the PM.     Sofosbuvir 400 MG Tabs  Take 400 mg by mouth daily.     sulfamethoxazole-trimethoprim 400-80 MG tablet  Commonly known as:  BACTRIM,SEPTRA  1 tablet by mouth on M, W, F     tacrolimus 5 MG capsule  Commonly known as:  PROGRAF  Take 5 mg by mouth 2 (two) times daily.     valGANciclovir 450 MG tablet  Commonly known as:  VALCYTE  Take 900 mg by mouth daily.           Objective:   Physical Exam BP 126/74 mmHg  Pulse 69  Temp(Src) 98 F (36.7 C) (Oral)  Ht 5\' 8"  (1.727 m)  Wt 175 lb 4 oz (79.493 kg)  BMI 26.65 kg/m2  SpO2 98% General:   Well developed, well  nourished . NAD.  HEENT:  Normocephalic . Face symmetric, atraumatic Lungs:  CTA B Normal respiratory effort, no intercostal retractions, no accessory muscle use. Heart: RRR,  no murmur.  No pretibial edema bilaterally  Skin: Not pale. Not jaundice Neurologic:  alert & oriented X3.  Speech normal, gait appropriate for age and unassisted Psych--  Cognition and judgment appear intact.  Cooperative with normal attention span and concentration.  Behavior appropriate. No anxious or depressed appearing.      Assessment & Plan:   Assessment > DM: Initially 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 then it resurfaced w/o steroids HTN Hyperlipidemia BPH-- failed Flomax before ED ?Osteoporosis steroid-induced: dexa wnl 2010 H/o uveitis Sarcoidosis s/p steroids for 10 years  GI:  f/u at [facility]  -- Liver transplant  04-07-2015 @ [facility]   --Hep C: S/p harvoni failure   --Cirrhosis --Hepatocellular ca, s/p TACE- microwave ablation --> failed, had chemo embolization 03-2015  --Reportedly get hepatitis A-B shots from GI  Plan: Recent labs reviewed, see above. DM: Continue with Levemir 30, adjust Humalog to 8-12-6 units. Will refer to endocrinology, will need more training on Humalog use if he is to stay on insulin  HTN: Needs improvement, restart benazepril, monitor BPs, BMP in 2 weeks High cholesterol: Currently on no medications, FLP in 2 weeks. RTC as scheduled April

## 2015-07-20 NOTE — Progress Notes (Signed)
Pre visit review using our clinic review tool, if applicable. No additional management support is needed unless otherwise documented below in the visit note. 

## 2015-07-20 NOTE — Assessment & Plan Note (Signed)
Recent labs reviewed, see above. DM: Continue with Levemir 30, adjust Humalog to 8-12-6 units. Will refer to endocrinology, will need more training on Humalog use if he is to stay on insulin  HTN: Needs improvement, restart benazepril, monitor BPs, BMP in 2 weeks High cholesterol: Currently on no medications, FLP in 2 weeks. RTC as scheduled April

## 2015-07-20 NOTE — Patient Instructions (Addendum)
  GO TO THE FRONT DESK Schedule labs to be done 2 weeks from today (fasting)   ------------- Start benazepril 10 mg one tablet every day Continue Levemir 30 units Humalog  change to 8 units, 12 units and 6 units  Call with the blood sugar readings in 2 weeks  Check the  blood pressure   daily Be sure your blood pressure is between 110/65 and  135/80 If it is consistently higher or lower, let me know

## 2015-07-22 ENCOUNTER — Ambulatory Visit (INDEPENDENT_AMBULATORY_CARE_PROVIDER_SITE_OTHER): Payer: Medicare Other | Admitting: Endocrinology

## 2015-07-22 VITALS — BP 128/76 | HR 71 | Temp 98.6°F | Ht 68.0 in | Wt 177.0 lb

## 2015-07-22 DIAGNOSIS — Z794 Long term (current) use of insulin: Secondary | ICD-10-CM

## 2015-07-22 DIAGNOSIS — E119 Type 2 diabetes mellitus without complications: Secondary | ICD-10-CM | POA: Diagnosis not present

## 2015-07-22 LAB — POCT GLYCOSYLATED HEMOGLOBIN (HGB A1C): Hemoglobin A1C: 5.2

## 2015-07-22 NOTE — Patient Instructions (Addendum)
good diet and exercise significantly improve the control of your diabetes.  please let me know if you wish to be referred to a dietician.  high blood sugar is very risky to your health.  you should see an eye doctor and dentist every year.  It is very important to get all recommended vaccinations.  controlling your blood pressure and cholesterol drastically reduces the damage diabetes does to your body.  Those who smoke should quit.  please discuss these with your doctor.  check your blood sugar twice a day.  vary the time of day when you check, between before the 3 meals, and at bedtime.  also check if you have symptoms of your blood sugar being too high or too low.  please keep a record of the readings and bring it to your next appointment here (or you can bring the meter itself).  You can write it on any piece of paper.  please call us sooner if your blood sugar goes below 70, or if you have a lot of readings over 200.   blood tests are requested for you today.  We'll let you know about the results.   If this other blood test agrees, we'll reduce the insulin.

## 2015-07-22 NOTE — Progress Notes (Signed)
Subjective:    Patient ID: Jeffery York, male    DOB: Dec 06, 1955, 60 y.o.   MRN: [id number]  HPI pt states DM was dx'ed in 09/23/1990, when he was on prednisone for sarcoidosis; he has mild if any neuropathy of the lower extremities; he is unaware of any associated chronic complications; he has been on insulin since late Sep 23, 2014; pt says his diet is good, but exercise is limited by recent surgery; he has never had pancreatitis, severe hypoglycemia or DKA.  He took prednisone in 2014-09-23 for gout.  He had liver transplant in late 2014/09/23.  He stopped prednisone 10 days ago.  he brings a record of his cbg's which i have reviewed today.  It varies from 68-200.  It is highest in am, but not necessarily so.   Past Medical History  Diagnosis Date  . 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 Hyperlipidemia   . Hypertension   . History of sarcoidosis     used steroids x 10 years  . 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 Hep C w/o coma, chronic (HCC)     bx aprox 12-10, s/p Harvoni, on a transplant list candidate   . Benign prostatic hypertrophy   . Uveitis     h/o  . Cirrhosis ([place])   . [place] (hepatocellular carcinoma) ([place])     s/p TACE and microwave ablation    Past Surgical History  Procedure Laterality Date  . Lung resection aspergilloma  1998/09/23  . 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 Marital Status: Married    Spouse Name: N/A  . Number of Children: 0  . Years of Education: N/A   Occupational History  . retired Nature conservation officer    Social History Main Topics  . Smoking status: Former Research scientist (life sciences)  . Smokeless tobacco: Never Used     Comment: remote smoker  . Alcohol Use: Yes     Comment: rarely   . Drug Use: No  . Sexual Activity: Not on file   Other Topics Concern  . Not on file   Social History Narrative   Mother- Lance Bosch one of my patients, deceased ~ 09-23-11   Married, No children      Current Outpatient Prescriptions on File Prior to Visit  Medication Sig Dispense Refill  . aspirin 81 MG tablet Take 81 mg by mouth daily.      . B-D ULTRAFINE III SHORT PEN 31G X 8 MM MISC Reported on 07/20/2015  99  . benazepril (LOTENSIN) 10 MG tablet Take 1 tablet (10 mg total) by mouth daily. 30 tablet 1  . Blood Glucose Monitoring Suppl (FREESTYLE LITE) DEVI Reported on 07/20/2015  0  . Elbasvir-Grazoprevir 50-100 MG TABS Take 1 tablet by mouth daily.    Marland Kitchen glucose blood (ONE TOUCH TEST STRIPS) test strip Use as instructed. Check blood sugar twice daily. 100 each 11  . insulin lispro (HUMALOG) 100 UNIT/ML injection Inject into the skin. 8--12--6 units    . Lancets (FREESTYLE) lancets Reported on 07/20/2015  11  . LEVEMIR FLEXTOUCH 100 UNIT/ML Pen 30 Units daily.    . magnesium oxide (MAG-OX) 400 MG tablet Take 400 mg by mouth daily.    . ribavirin (REBETOL) 200 MG capsule Take 3 capsules in the AM and 3 capsules in the PM.    . Sofosbuvir 400 MG  TABS Take 400 mg by mouth daily.    Marland Kitchen sulfamethoxazole-trimethoprim (BACTRIM,SEPTRA) 400-80 MG tablet 1 tablet by mouth on M, W, F    . tacrolimus (PROGRAF) 5 MG capsule Take 5 mg by mouth 2 (two) times daily.    . valGANciclovir (VALCYTE) 450 MG tablet Take 900 mg by mouth daily.     No current facility-administered medications on file prior to visit.    Allergies  Allergen Reactions  . Erythromycin Ethylsuccinate     REACTION: GI Intolerance/vomiting    Family History  Problem Relation Age of Onset  . Prostate cancer Brother     at age 76s  . Heart attack Brother     at age 16  . Lung cancer Father   . Diabetes Other     father side   . Colon cancer Neg Hx     BP 128/76 mmHg  Pulse 71  Temp(Src) 98.6 F (37 C) (Oral)  Ht 5\' 8"  (1.727 m)  Wt 177 lb (80.287 kg)  BMI 26.92 kg/m2  SpO2 96%  Review of Systems denies blurry vision, headache, chest pain, sob, n/v, muscle cramps, excessive diaphoresis, memory loss, cold  intolerance, and rhinorrhea.  He has urinary frequency, easy bruising, and weight gain.      Objective:   Physical Exam VS: see vs page GEN: no distress HEAD: head: no deformity eyes: no periorbital swelling, no proptosis external nose and ears are normal mouth: no lesion seen NECK: supple, thyroid is not enlarged CHEST WALL: no deformity LUNGS: clear to auscultation BREASTS:  No gynecomastia CV: reg rate and rhythm, no murmur ABD: abdomen is soft, nontender.  not distended.  no hernia.  large healed surgical scar.  MUSCULOSKELETAL: muscle bulk and strength are grossly normal.  no obvious joint swelling.  gait is normal and steady EXTEMITIES: no deformity.  no ulcer on the feet.  feet are of normal color and temp.  no edema PULSES: dorsalis pedis intact bilat.  no carotid bruit NEURO:  cn 2-12 grossly intact.   readily moves all 4's.  sensation is intact to touch on the feet SKIN:  Normal texture and temperature.  No rash or suspicious lesion is visible.   NODES:  None palpable at the neck PSYCH: alert, well-oriented.  Does not appear anxious nor depressed.  A1c=5.2% Lab Results  Component Value Date   CREATININE 1.0 07/12/2015   BUN 16 07/12/2015   NA 141 07/12/2015   K 4.4 07/12/2015   CL 100 07/23/2014   CO2 28 07/23/2014   i personally reviewed electrocardiogram tracing (05/21/13): Indication: DM Impression: Sinus  Bradycardia. Anterolateral ST-elevation -repolarization variant.   I have reviewed outside records, and summarized: Pt was noted to have variable cbg's, and referred here.     Assessment & Plan:  DM: apparently overcontrolled.  a1c is much lower than would be suggested by cbg's, so we'll check fructosamine.    Patient is advised the following: Patient Instructions  good diet and exercise significantly improve the control of your diabetes.  please let me know if you wish to be referred to a dietician.  high blood sugar is very risky to your health.  you  should see an eye doctor and dentist every year.  It is very important to get all recommended vaccinations.  controlling your blood pressure and cholesterol drastically reduces the damage diabetes does to your body.  Those who smoke should quit.  please discuss these with your doctor.  check your blood sugar twice  a day.  vary the time of day when you check, between before the 3 meals, and at bedtime.  also check if you have symptoms of your blood sugar being too high or too low.  please keep a record of the readings and bring it to your next appointment here (or you can bring the meter itself).  You can write it on any piece of paper.  please call us sooner if your blood sugar goes below 70, or if you have a lot of readings over 200.   blood tests are requested for you today.  We'll let you know about the results.   If this other blood test agrees, we'll reduce the insulin.

## 2015-07-26 DIAGNOSIS — R404 Transient alteration of awareness: Secondary | ICD-10-CM | POA: Diagnosis not present

## 2015-07-26 DIAGNOSIS — R42 Dizziness and giddiness: Secondary | ICD-10-CM | POA: Diagnosis not present

## 2015-07-26 LAB — FRUCTOSAMINE: FRUCTOSAMINE: 282 umol/L — AB (ref 190–270)

## 2015-08-03 ENCOUNTER — Other Ambulatory Visit (INDEPENDENT_AMBULATORY_CARE_PROVIDER_SITE_OTHER): Payer: Medicare Other

## 2015-08-03 DIAGNOSIS — E785 Hyperlipidemia, unspecified: Secondary | ICD-10-CM | POA: Diagnosis not present

## 2015-08-03 DIAGNOSIS — E118 Type 2 diabetes mellitus with unspecified complications: Secondary | ICD-10-CM

## 2015-08-03 DIAGNOSIS — I10 Essential (primary) hypertension: Secondary | ICD-10-CM | POA: Diagnosis not present

## 2015-08-03 DIAGNOSIS — Z794 Long term (current) use of insulin: Secondary | ICD-10-CM

## 2015-08-03 LAB — BASIC METABOLIC PANEL
BUN: 22 mg/dL (ref 6–23)
CALCIUM: 9 mg/dL (ref 8.4–10.5)
CO2: 25 meq/L (ref 19–32)
CREATININE: 1.06 mg/dL (ref 0.40–1.50)
Chloride: 103 mEq/L (ref 96–112)
GFR: 91.27 mL/min (ref >60.00)
Glucose, Bld: 141 mg/dL — ABNORMAL HIGH (ref 70–99)
Potassium: 4.7 mEq/L (ref 3.5–5.1)
Sodium: 135 mEq/L (ref 135–145)

## 2015-08-03 LAB — LIPID PANEL
CHOL/HDL RATIO: 3
Cholesterol: 129 mg/dL (ref 0–200)
HDL: 50.8 mg/dL (ref >39.00)
LDL Cholesterol: 66 mg/dL (ref 0–99)
NONHDL: 77.83
TRIGLYCERIDES: 58 mg/dL (ref 0.0–149.0)
VLDL: 11.6 mg/dL (ref 0.0–40.0)

## 2015-08-03 LAB — HEMOGLOBIN A1C: HEMOGLOBIN A1C: 4.8 % (ref 4.6–6.5)

## 2015-08-23 DIAGNOSIS — B259 Cytomegaloviral disease, unspecified: Secondary | ICD-10-CM | POA: Diagnosis not present

## 2015-08-23 DIAGNOSIS — Z944 Liver transplant status: Secondary | ICD-10-CM | POA: Diagnosis not present

## 2015-08-23 DIAGNOSIS — Z4823 Encounter for aftercare following liver transplant: Secondary | ICD-10-CM | POA: Diagnosis not present

## 2015-09-03 ENCOUNTER — Emergency Department: Admit: 2015-09-03 | Payer: MEDICARE

## 2015-09-03 ENCOUNTER — Inpatient Hospital Stay
Admit: 2015-09-03 | Discharge: 2015-09-04 | Disposition: A | Discharge status: Home / Self Care | Payer: MEDICARE | Attending: Emergency Medicine

## 2015-09-03 DIAGNOSIS — S67197A Crushing injury of left little finger, initial encounter: Secondary | ICD-10-CM

## 2015-09-03 DIAGNOSIS — T1490 Injury, unspecified: Secondary | ICD-10-CM | POA: Diagnosis not present

## 2015-09-03 DIAGNOSIS — M79645 Pain in left finger(s): Secondary | ICD-10-CM | POA: Diagnosis not present

## 2015-09-03 NOTE — ED Triage Notes (Signed)
Patient complains of left pinky avulsion after slamming his finger in the door.

## 2015-09-03 NOTE — ED Notes (Signed)
10:04 PM  09/03/15     Discharge instructions given to [name] (name) with verbalization of understanding. Patient accompanied by wife.  Patient discharged with the following prescriptions keflex. Patient discharged to home (destination).      Andrew Harper Donnamarie RossettiA Shadae Reino, RN

## 2015-09-03 NOTE — ED Triage Notes (Signed)
Patient reported a door got slammed to his L 5th finger.

## 2015-09-03 NOTE — ED Notes (Signed)
Bedside and Verbal shift change report given to Coralie (oncoming nurse) by Cynthia (offgoing nurse). Report included the following information SBAR and Kardex.

## 2015-09-03 NOTE — ED Notes (Addendum)
.  shift  Verbal shift change report given to Kaidyn Javid Control and instrumentation engineer(oncoming nurse) by Aram Beechamynthia (offgoing nurse). Report included the following information SBAR, Kardex, ED Summary, [facility] and Recent Results.

## 2015-09-03 NOTE — ED Notes (Signed)
GrenadaBrittany PA-C at bedside. Same irrigating and inspecting the nail invulsion.

## 2015-09-03 NOTE — ED Provider Notes (Addendum)
[facility]  Emer[facility] Treatment Report    Patient: Andrew Galloway Age: 60 y.o. Sex: male    Date of Birth: 08/14/1955 Admit Date: 09/03/2015 PCP: None   MRN: [id number]  CSN: [id number]     Room: 104/EO04 Time Dictated: 8:37 PM      Chief Complaint   Chief Complaint   Patient presents with   ??? Crush injury   ??? Nail Problem         History of Present Illness   60 y.o. male presents emergency Department with with left pinky injury that occurred today when the wind blew the door abruptly shut and his left pinky was caught in the door. He denies other injury at the distal pinky. He does have bleeding from the nail with injury to the nail visible. He states that his tetanus is up-to-date within the last 5 years. He did not take any medicine for pain.    Review of Systems     Constitutional: No fever, chills.  No weakness.   ENT: No sore throat, congestion or ear pain.  Respiratory: No cough, shortness of breath  Cardiovascular: No chest pain/pressure or palpitations. No syncope.  Gastrointestinal: No vomiting, diarrhea or abdominal pain.   Musculoskeletal: Positive left fifth finger distal phalanx injury. No other joint or bony injury.  Integumentary: Positive bleeding from nail bed without evidence of skin laceration.  Neurological: No headache or dizziness.     Past Medical/Surgical History     Past Medical History:   Diagnosis Date   ??? Sarcoid (HCC)      Past Surgical History:   Procedure Laterality Date   ??? HX LIVER TRANSPLANT         Social History     Social History     Social History   ??? Marital status: MARRIED     Spouse name: N/A   ??? Number of children: N/A   ??? Years of education: N/A     Social History Main Topics   ??? Smoking status: Former Smoker   ??? Smokeless tobacco: Not on file   ??? Alcohol use No   ??? Drug use: No   ??? Sexual activity: Not on file     Other Topics Concern   ??? Not on file     Social History Narrative   ??? No narrative on file     Family History    No family history on file.    Home Medications     Prior to Admission Medications   Prescriptions Last Dose Informant Patient Reported? Taking?   SULFAMETHOXAZOLE/TRIMETHOPRIM (SMZ-TMP DS PO)   Yes Yes   Sig: Take  by mouth.   amLODIPine (NORVASC) 10 mg tablet 09/03/2015  Yes Yes   Sig: Take 10 mg by mouth daily.   aspirin 81 mg chewable tablet 09/02/2015  Yes Yes   Sig: Take 81 mg by mouth daily.   insulin detemir (LEVEMIR FLEXTOUCH) 100 unit/mL (3 mL) inpn 09/02/2015  Yes Yes   Sig: 20 Units by SubCUTAneous route nightly.   magnesium oxide (MAG-OX) 400 mg tablet 09/03/2015  Yes Yes   Sig: Take 400 mg by mouth two (2) times a day.   tacrolimus (PROGRAF) 5 mg capsule 09/03/2015  Yes Yes   Sig: Take 5 mg by mouth two (2) times a day.   trimethoprim-sulfamethoxazole (BACTRIM) 80-400 mg per tablet 09/03/2015  Yes Yes   Sig: Take 1 Tab by mouth every Monday, Wednesday, Friday.   valGANciclovir (  VALCYTE) 450 mg tablet 09/03/2015  Yes Yes   Sig: Take 900 mg by mouth daily.      Facility-Administered Medications: None       Allergies     Allergies   Allergen Reactions   ??? Erythromycin Nausea and Vomiting       Physical Exam   ED Triage Vitals   Enc Vitals Group      BP 09/03/15 1748 128/78      Pulse (Heart Rate) 09/03/15 1748 71      Resp Rate 09/03/15 1748 18      Temp 09/03/15 1748 98.4 ??F (36.9 ??C)      Temp src --       O2 Sat (%) 09/03/15 1748 100 %      Weight 09/03/15 1748 169 lb      Height 09/03/15 1748       Head Cir --       Peak Flow --       Pain Score --       Pain Loc --       Pain Edu? --       Excl. in GC? --      General: Patient appears well developed and well nourished.   HEENT: Conjunctiva clear. Mucous membranes moist, non-erythematous.   Neck: Supple, symmetrical.   Respiratory: Lungs clear to auscultation, nonlabored respirations. No wheezes, rhonchi, or rales.  Cardiovascular: Heart regular rate and rhythm without murmur, rubs or gallops.    Musculoskeletal: Left fifth digit distal phalanx with mild erythema, edema and tenderness to palpation. Flexion-extension of the DIP joint intact yet With pain. Flexion-extension of the PIP and MCP joint of the fifth digit fully intact. Distal capillary refill intact. Distal sensation intact grossly.  Integumentary: Left fifth nail with proximal nail unroofed and lying on the skin proximal to the cuticle. No evidence of skin laceration of the distal phalanx. With up lifting the nail proximally, evidence of tissue loss of the nail bed on the medial surface without clear margins of laceration to perform repair.  Neurologic: Alert and oriented, moving all extremities.     Impression and Management Plan   Patient was evaluated by myself, Dr Thana Ates and Anibal Henderson PA-C in the emergency department and it was determined that with tissue loss of the nail bed without evidence of medial tissue not amenable to suture repair.    We will obtain x-ray to rule out distal phalanx fracture with tenderness to palpation of the distal phalanx. No significant concern for tendon laceration due to intact range of motion and good strength of PIP and DIP. Neurovascular intact without evidence of compromise.  Diagnostic Studies   Lab:   No results found for this or any previous visit (from the past 12 hour(s)).    Imaging:    Xr 5th Finger Lt Min 2 V    Result Date: 09/03/2015  EXAMINATION: XR 5TH FINGER LT MIN 2 V CLINICAL INDICATION: r/o fracture distal phalanx, caught in door, nail disruption acute trauma, pain little finger. COMPARISON:  None. TECHNIQUE: 3 views left little finger. FINDINGS: Soft tissue swelling distal phalanx little finger. Mild degenerative narrowing DIP. No fracture. No periostitis.     IMPRESSION:  Mild degenerative change. No fracture. Soft tissue injury.     ED Course/ Medical Decision Making   Digital block performed with approx 5cc 1% lidocaine and bupivacaine at  the proximal left MCP volar surface. Patient was properly anesthetized prior to evaluation of  injury. Multiple attempts to reposition the proximal nail into healthy position without success.     We will apply antibiotic ointment and padded dressing and immobilized the fifth digit. Patient is comfortable with this plan, he will be given name and number of plastic surgeon for follow-up. Unfortunately he does not live in the area He states that he will pursue plastic surgeon or hand specialist at his home. He will be given 3 days of oral antibiotics for prophylaxis, initially in light of recent liver transplant and chronic immunosuppression.    Continued by Dr. Thana Ates    I interviewed and examined the patient. I discussed with the mid-level provider agree with her evaluation and plan as documented here.    Patient with crush injury to L 5th digit with the proximal nail unroofed from cuticle. No Fx on xray. Wound care performed by PA-C. Several days of PO keflex given, pt from out of town and advised he needs to see hand specialist within the next few days as soon as possible    Final Diagnosis       ICD-10-CM ICD-9-CM   1. Nail, injury by, initial encounter W45.0XXA E920.8       Disposition   Discharge to home.     Patient was advised to return to the Emergency Department if new or worsening symptoms.      The patient was personally evaluated by myself and Dr. Thana Ates who agrees with the above assessment and plan    Hilma Favors, PA  September 03, 2015    My signature above authenticates this document and my orders, the final ??  diagnosis (es), discharge prescription (s), and instructions in the Epic ??  record.  If you have any questions please contact [phone].  ??  Nursing notes have been reviewed by the physician/ advanced practice ??  Clinician.

## 2015-09-04 MED ORDER — CEPHALEXIN 500 MG CAP
500 mg | ORAL | Status: AC
Start: 2015-09-04 — End: 2015-09-03
  Administered 2015-09-04: 01:00:00 via ORAL

## 2015-09-04 MED ORDER — HYDROCODONE-ACETAMINOPHEN 5 MG-325 MG TAB
5-325 mg | ORAL | Status: AC
Start: 2015-09-04 — End: 2015-09-03
  Administered 2015-09-04: 01:00:00 via ORAL

## 2015-09-04 MED ORDER — CEPHALEXIN 500 MG CAP
500 mg | ORAL_CAPSULE | Freq: Three times a day (TID) | ORAL | 0 refills | Status: AC
Start: 2015-09-04 — End: 2015-09-06

## 2015-09-04 MED FILL — HYDROCODONE-ACETAMINOPHEN 5 MG-325 MG TAB: 5-325 mg | ORAL | Qty: 1

## 2015-09-04 MED FILL — CEPHALEXIN 500 MG CAP: 500 mg | ORAL | Qty: 1

## 2015-09-08 DIAGNOSIS — Z944 Liver transplant status: Secondary | ICD-10-CM | POA: Diagnosis not present

## 2015-09-08 DIAGNOSIS — B259 Cytomegaloviral disease, unspecified: Secondary | ICD-10-CM | POA: Diagnosis not present

## 2015-09-08 DIAGNOSIS — Z4823 Encounter for aftercare following liver transplant: Secondary | ICD-10-CM | POA: Diagnosis not present

## 2015-09-15 ENCOUNTER — Telehealth: Payer: Self-pay

## 2015-09-15 NOTE — Telephone Encounter (Signed)
Pre-visit call completed with  patient. 

## 2015-09-16 ENCOUNTER — Ambulatory Visit (INDEPENDENT_AMBULATORY_CARE_PROVIDER_SITE_OTHER): Payer: Medicare Other | Admitting: Internal Medicine

## 2015-09-16 ENCOUNTER — Encounter: Payer: Self-pay | Admitting: Internal Medicine

## 2015-09-16 VITALS — BP 132/58 | HR 69 | Temp 97.9°F | Ht 68.0 in | Wt 174.4 lb

## 2015-09-16 DIAGNOSIS — Z0000 Encounter for general adult medical examination without abnormal findings: Secondary | ICD-10-CM | POA: Diagnosis not present

## 2015-09-16 DIAGNOSIS — I10 Essential (primary) hypertension: Secondary | ICD-10-CM | POA: Diagnosis not present

## 2015-09-16 DIAGNOSIS — E118 Type 2 diabetes mellitus with unspecified complications: Secondary | ICD-10-CM | POA: Diagnosis not present

## 2015-09-16 DIAGNOSIS — Z09 Encounter for follow-up examination after completed treatment for conditions other than malignant neoplasm: Secondary | ICD-10-CM

## 2015-09-16 DIAGNOSIS — E785 Hyperlipidemia, unspecified: Secondary | ICD-10-CM

## 2015-09-16 DIAGNOSIS — Z794 Long term (current) use of insulin: Secondary | ICD-10-CM

## 2015-09-16 MED ORDER — AMLODIPINE BESYLATE 10 MG PO TABS: 10.0000 mg | ORAL_TABLET | Freq: Every day | ORAL | Status: DC

## 2015-09-16 NOTE — Progress Notes (Signed)
Pre visit review using our clinic review tool, if applicable. No additional management support is needed unless otherwise documented below in the visit note. 

## 2015-09-16 NOTE — Progress Notes (Signed)
Subjective:    Patient ID: Jeffery York, male    DOB: 07-04-1955, 60 y.o.   MRN: [id number]  DOS:  09/16/2015 Type of visit - description : Routine checkup Interval history: Diabetes: Saw Dr. Loanne Drilling, A1c was very low, Humalog discontinue, only Levemir, CBGs no low any longer although occasionally has a 68 in the mornings. HTN: On benazepril, ambulatory BP is excellent    Review of Systems Constitutional: No fever. No chills. No unexplained wt changes. No unusual sweats  HEENT: No dental problems, no ear discharge, no facial swelling, no voice changes. No eye discharge, no eye  redness , no  intolerance to light   Respiratory: No wheezing , no  difficulty breathing. No cough , no mucus production  Cardiovascular: No CP, no leg swelling , no  Palpitations  GI: no nausea, no vomiting, no diarrhea , no  abdominal pain.  No blood in the stools. No dysphagia, no odynophagia    Endocrine: No polyphagia, no polyuria , no polydipsia  GU: No dysuria, gross hematuria, difficulty urinating. No urinary urgency, no frequency.  Musculoskeletal: No joint swellings or unusual aches or pains  Skin: No change in the color of the skin, palor , no  Rash  Allergic, immunologic: No environmental allergies , no  food allergies  Neurological: No dizziness no  syncope. No headaches. No diplopia, no slurred, no slurred speech, no motor deficits, no facial  Numbness  Hematological: No enlarged lymph nodes, no easy bruising , no unusual bleedings  Psychiatry: No suicidal ideas, no hallucinations, no beavior problems, no confusion.  No unusual/severe anxiety, no depression   Past Medical History  Diagnosis Date  . 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 Hyperlipidemia   . Hypertension   . History of sarcoidosis     used steroids x 10 years  . 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 Hep C w/o coma, chronic (HCC)    bx aprox 12-10, s/p Harvoni, on a transplant list candidate   . Benign prostatic hypertrophy   . Uveitis     h/o  . Cirrhosis ([place])   . [place] (hepatocellular carcinoma) ([place])     s/p TACE and microwave ablation    Past Surgical History  Procedure Laterality Date  . 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 Marital Status: Married    Spouse Name: N/A  . Number of Children: 0  . Years of Education: N/A   Occupational History  . retired Nature conservation officer    Social History Main Topics  . Smoking status: Former Research scientist (life sciences)  . Smokeless tobacco: Never Used     Comment: remote smoker  . Alcohol Use: Yes     Comment: rarely   . Drug Use: No  . Sexual Activity: Not on file   Other Topics Concern  . Not on file   Social History Narrative   Mother- Lance Bosch one of my patients, deceased ~ Sep 17, 2011   Married, No children      Family History  Problem Relation Age of Onset  . Prostate cancer Brother     at age 68s  . Heart attack Brother     at age 29  . Lung cancer Father   . Diabetes Other     father side   . Colon cancer Neg Hx  Medication List       This list is accurate as of: 09/16/15 11:59 PM.  Always use your most recent med list.               amLODipine 10 MG tablet  Commonly known as:  NORVASC  Take 1 tablet (10 mg total) by mouth daily.     amLODipine 10 MG tablet  Commonly known as:  NORVASC  Take 1 tablet (10 mg total) by mouth daily.     aspirin 81 MG tablet  Take 81 mg by mouth daily.     B-D ULTRAFINE III SHORT PEN 31G X 8 MM Misc  Generic drug:  Insulin Pen Needle  Reported on 09/16/2015     benazepril 10 MG tablet  Commonly known as:  LOTENSIN  Take 1 tablet (10 mg total) by mouth daily.     freestyle lancets  Reported on 09/16/2015     FREESTYLE LITE Devi  Reported on 09/16/2015     glucose blood test strip  Commonly known as:  ONE TOUCH TEST STRIPS  Use as instructed. Check  blood sugar twice daily.     LEVEMIR FLEXTOUCH 100 UNIT/ML Pen  Generic drug:  Insulin Detemir  20 Units daily.     magnesium oxide 400 MG tablet  Commonly known as:  MAG-OX  Take 400 mg by mouth daily.     sulfamethoxazole-trimethoprim 400-80 MG tablet  Commonly known as:  BACTRIM,SEPTRA  1 tablet by mouth on M, W, F     tacrolimus 5 MG capsule  Commonly known as:  PROGRAF  Take 5 mg by mouth 2 (two) times daily.     valGANciclovir 450 MG tablet  Commonly known as:  VALCYTE  Take 900 mg by mouth daily.           Objective:   Physical Exam BP 132/58 mmHg  Pulse 69  Temp(Src) 97.9 F (36.6 C) (Oral)  Ht 5\' 8"  (1.727 m)  Wt 174 lb 6 oz (79.096 kg)  BMI 26.52 kg/m2  SpO2 98%  General:   Well developed, well nourished . NAD.  Neck: No  thyromegaly   HEENT:  Normocephalic . Face symmetric, atraumatic Lungs:  CTA B Normal respiratory effort, no intercostal retractions, no accessory muscle use. Heart: RRR,  no murmur.  No pretibial edema bilaterally  Abdomen:  Not distended, soft, non-tender. No rebound or rigidity.   DIABETIC FEET EXAM: No lower extremity edema Normal pedal pulses bilaterally Skin normal, nails normal, no calluses Pinprick examination of the feet normal. Neurologic:  alert & oriented X3.  Speech normal, gait appropriate for age and unassisted Strength symmetric and appropriate for age.  Psych: Cognition and judgment appear intact.  Cooperative with normal attention span and concentration.  Behavior appropriate. No anxious or depressed appearing.    Assessment & Plan:   Assessment > DM: per Dr Loanne Drilling -- Initially 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 then it resurfaced w/o steroids HTN Hyperlipidemia BPH-- failed Flomax before ED ?Osteoporosis steroid-induced: dexa wnl 2010 H/o uveitis Sarcoidosis s/p steroids for 10 years  GI:  f/u at [facility]  -- Liver transplant  04-07-2015 @ [place]   --Hep C: S/p harvoni failure   --Cirrhosis --Hepatocellular  ca, s/p TACE- microwave ablation --> failed, had chemo embolization 03-2015  --Reportedly get hepatitis A-B shots from GI  Plan: DM: Humalog was discontinued, on Levemir 20 units, follow-up by Dr. Loanne Drilling. Eyes check at the [facility], foot exam negative today. HTN: Started benazepril, BPs are very good,  follow-up BMP satisfactory High cholesterol: on no meds, last FLP satisfactory Liver issues: Follow-up at the [facility] and [place]. RTC a months

## 2015-09-16 NOTE — Assessment & Plan Note (Signed)
Td 2014; pneumonia shot 2007-2014; prevnar-- 2016  DRE PSA 2016 wnl  Cscope 5-10, diverticuli, next in 10 years Diet-exercise discussed

## 2015-09-16 NOTE — Patient Instructions (Signed)
GO TO THE FRONT DESK Schedule your next appointment for a   Check up in 8 months , fasting    Check the  blood pressure daily   Be sure your blood pressure is between 110/65 and  145/85. If it is consistently higher or lower, let me know   Ok vit B daily and Vit d 600 u daily

## 2015-09-19 NOTE — Assessment & Plan Note (Signed)
DM: Humalog was discontinued, on Levemir 20 units, follow-up by Dr. Loanne Drilling. Eyes check at the [facility], foot exam negative today. HTN: Started benazepril, BPs are very good, follow-up BMP satisfactory High cholesterol: on no meds, last FLP satisfactory Liver issues: Follow-up at the [facility] and [place]. RTC a months

## 2015-09-22 DIAGNOSIS — Z944 Liver transplant status: Secondary | ICD-10-CM | POA: Diagnosis not present

## 2015-09-22 DIAGNOSIS — Z4823 Encounter for aftercare following liver transplant: Secondary | ICD-10-CM | POA: Diagnosis not present

## 2015-09-22 DIAGNOSIS — B259 Cytomegaloviral disease, unspecified: Secondary | ICD-10-CM | POA: Diagnosis not present

## 2015-09-23 ENCOUNTER — Other Ambulatory Visit: Payer: Self-pay

## 2015-10-07 DIAGNOSIS — Z944 Liver transplant status: Secondary | ICD-10-CM | POA: Diagnosis not present

## 2015-10-07 DIAGNOSIS — Z4823 Encounter for aftercare following liver transplant: Secondary | ICD-10-CM | POA: Diagnosis not present

## 2015-10-07 DIAGNOSIS — B259 Cytomegaloviral disease, unspecified: Secondary | ICD-10-CM | POA: Diagnosis not present

## 2015-10-14 DIAGNOSIS — E875 Hyperkalemia: Secondary | ICD-10-CM | POA: Diagnosis not present

## 2015-10-20 DIAGNOSIS — B259 Cytomegaloviral disease, unspecified: Secondary | ICD-10-CM | POA: Diagnosis not present

## 2015-10-20 DIAGNOSIS — Z944 Liver transplant status: Secondary | ICD-10-CM | POA: Diagnosis not present

## 2015-10-20 DIAGNOSIS — Z4823 Encounter for aftercare following liver transplant: Secondary | ICD-10-CM | POA: Diagnosis not present

## 2015-11-03 DIAGNOSIS — B259 Cytomegaloviral disease, unspecified: Secondary | ICD-10-CM | POA: Diagnosis not present

## 2015-11-03 DIAGNOSIS — Z944 Liver transplant status: Secondary | ICD-10-CM | POA: Diagnosis not present

## 2015-11-03 DIAGNOSIS — Z4823 Encounter for aftercare following liver transplant: Secondary | ICD-10-CM | POA: Diagnosis not present

## 2015-11-17 DIAGNOSIS — Z4823 Encounter for aftercare following liver transplant: Secondary | ICD-10-CM | POA: Diagnosis not present

## 2015-11-17 DIAGNOSIS — Z944 Liver transplant status: Secondary | ICD-10-CM | POA: Diagnosis not present

## 2015-11-17 DIAGNOSIS — B259 Cytomegaloviral disease, unspecified: Secondary | ICD-10-CM | POA: Diagnosis not present

## 2015-11-26 ENCOUNTER — Ambulatory Visit: Payer: Self-pay | Admitting: *Deleted

## 2015-11-29 ENCOUNTER — Other Ambulatory Visit: Payer: Self-pay | Admitting: *Deleted

## 2015-11-29 NOTE — Patient Outreach (Signed)
[facility]) Care Management  11/29/2015  Jeffery York 11-09-1955 [id number]   RN Health Coach telephone call to patient.  Hipaa compliance verified. RN discussed with patient about THN. Per patient he is at a place where he would feel uncomfortable discussing this could I please call him back.  Plan: RN told  Patient I would call  again within 14 days.Patient agreed to follow up call and very interested in program  [place] Management [phone]

## 2015-12-01 DIAGNOSIS — B259 Cytomegaloviral disease, unspecified: Secondary | ICD-10-CM | POA: Diagnosis not present

## 2015-12-01 DIAGNOSIS — Z944 Liver transplant status: Secondary | ICD-10-CM | POA: Diagnosis not present

## 2015-12-01 DIAGNOSIS — Z4823 Encounter for aftercare following liver transplant: Secondary | ICD-10-CM | POA: Diagnosis not present

## 2015-12-08 DIAGNOSIS — Z944 Liver transplant status: Secondary | ICD-10-CM | POA: Diagnosis not present

## 2015-12-08 DIAGNOSIS — Z4823 Encounter for aftercare following liver transplant: Secondary | ICD-10-CM | POA: Diagnosis not present

## 2015-12-08 DIAGNOSIS — B259 Cytomegaloviral disease, unspecified: Secondary | ICD-10-CM | POA: Diagnosis not present

## 2015-12-10 ENCOUNTER — Ambulatory Visit: Payer: Self-pay | Admitting: *Deleted

## 2015-12-13 ENCOUNTER — Other Ambulatory Visit: Payer: Self-pay | Admitting: *Deleted

## 2015-12-13 ENCOUNTER — Encounter: Payer: Self-pay | Admitting: *Deleted

## 2015-12-13 NOTE — Patient Outreach (Signed)
[place] [facility]) Care Management  Bernice  12/13/2015   HASHIM SERMERSHEIM 10-Dec-1955 [id number]  Subjective:RN Health Coach telephone call to patient.  Hipaa compliance verified. Per patient his diabetes is doing pretty good. He is usually around 102 in the morning. Patient reported that his blood sugar had dropped to 60 and at one time it said 20. Patient stated his symptoms for hypoglycemia are weakness, tired and sweaty.The day it dropped real low was because   he was fasting for labs and they were running a little late. It was 2 hrs later before they got them drawn. Patient stated at that time he began to see flashes of light.  Patient stated that his night time snack sometimes is a piece of cake. RN discussed with patient about eating healthy snacks especially bedtime snacks.  Patient asked about sometimes drinking Ensure or Glucerna for a snack.  Patient is on  Levemir insulin at  night. Per patient he is hoping to be able to get back to the pill. The patient A1C is 5.2. The patient diabetes was steroid induced. Patient has agreed to follow up outreach calls and educational material.     Objective:   Encounter Medications:  Outpatient Encounter Prescriptions as of 12/13/2015  Medication Sig Note  . amLODipine (NORVASC) 10 MG tablet Take 1 tablet (10 mg total) by mouth daily.   Marland Kitchen amLODipine (NORVASC) 10 MG tablet Take 1 tablet (10 mg total) by mouth daily. [id number]: duplicate  . aspirin 81 MG tablet Take 81 mg by mouth daily.     . B-D ULTRAFINE III SHORT PEN 31G X 8 MM MISC Reported on 09/16/2015 05/07/2015: Received from: External Pharmacy Received Sig:   . benazepril (LOTENSIN) 10 MG tablet Take 1 tablet (10 mg total) by mouth daily.   . Blood Glucose Monitoring Suppl (FREESTYLE LITE) DEVI Reported on 09/16/2015 05/07/2015: Received from: External Pharmacy Received Sig:   . Lancets (FREESTYLE) lancets Reported on 09/16/2015 05/07/2015: Received from: External Pharmacy  Received Sig:   . LEVEMIR FLEXTOUCH 100 UNIT/ML Pen 20 Units daily.  12/13/2015: Takes at night  . magnesium oxide (MAG-OX) 400 MG tablet Take 400 mg by mouth daily.   Marland Kitchen sulfamethoxazole-trimethoprim (BACTRIM,SEPTRA) 400-80 MG tablet 1 tablet by mouth on M, W, F 05/07/2015: Received Sig:   . tacrolimus (PROGRAF) 5 MG capsule Take 5 mg by mouth 2 (two) times daily.   . valGANciclovir (VALCYTE) 450 MG tablet Take 900 mg by mouth daily. 05/07/2015: Received Sig:   . glucose blood (ONE TOUCH TEST STRIPS) test strip Use as instructed. Check blood sugar twice daily. (Patient not taking: Reported on 09/16/2015)    No facility-administered encounter medications on file as of 12/13/2015.    Functional Status:  In your present state of health, do you have any difficulty performing the following activities: 12/13/2015 09/16/2015  Hearing? Y N  Vision? - N  Difficulty concentrating or making decisions? Y N  Walking or climbing stairs? N N  Dressing or bathing? N N  Doing errands, shopping? Y N  Preparing Food and eating ? N -  Using the Toilet? N -  In the past six months, have you accidently leaked urine? N -  Do you have problems with loss of bowel control? N -  Managing your Medications? N -  Managing your Finances? N -  Housekeeping or managing your Housekeeping? N -    Fall/Depression Screening: [facility] 2/9 Scores 12/13/2015 09/16/2015 05/21/2013 05/21/2013 01/20/2013  PHQ -  2 Score 0 0 0 0 0   THN CM Care Plan Problem One        Most Recent Value   Care Plan Problem One  Knowledge Deficit in Self management of Diabetes   Role Documenting the Problem One  [place] for Problem One  Active   THN Long Term Goal (31-90 days)  Patient will not have any admissions to hosptal due to diabetes within the next 90 days   THN Long Term Goal Start Date  12/13/15   Interventions for Problem One [place] reminded patient to take medications as per ordered. RN rreminded  patient to keep appointments as per order. RN will monittor patient telephonically   THN CM Short Term Goal #1 (0-30 days)  Patient will report not having any hypoglycemic reactions within the next 30 days   THN CM Short Term Goal #1 Start Date  12/13/15   Interventions for Short Term Goal #1  RN discussed eating healthy snacks. RN discussed the signs and symptoms of hypo and hyperglycemia, RN will follow up with additional discussiona and teach back in 30 days   THN CM Short Term Goal #2 (0-30 days)  Patient will be able to verbalize signs and symptoms of hypo and hyperglycemia and action plan within 30 days   THN CM Short Term Goal #2 Start Date  12/13/15   Interventions for Short Term Goal #2  RN sent picture chart of low and high blood sugar. RN sent EMMI information on high and low blood sugar. RN sent EMMI educational material on high and low blood sugar. RN sent A Diabetes education book living well with Diabetes.RN will follow up within 30 days with discussion and teachback     Assessment:  Knowledge Deficit in sent Management of Diabetes Patient needs education on healthy snacks Patient will benefit from [place] telephonic outreach for education and support for diabetes self management.  Plan:  RN sent patient Living well with Diabetes Book RN sent patient a list of healthy snacks RN sent patient a picture chart of hypo and hyperglycemia RN will send patient nutritional samples coupons of glucerna  RN will follow up with 30 days for discussion and teach back.  [place] Care Management [phone]

## 2015-12-16 DIAGNOSIS — Z944 Liver transplant status: Secondary | ICD-10-CM | POA: Diagnosis not present

## 2015-12-16 DIAGNOSIS — Z4823 Encounter for aftercare following liver transplant: Secondary | ICD-10-CM | POA: Diagnosis not present

## 2015-12-16 DIAGNOSIS — B259 Cytomegaloviral disease, unspecified: Secondary | ICD-10-CM | POA: Diagnosis not present

## 2015-12-22 DIAGNOSIS — B259 Cytomegaloviral disease, unspecified: Secondary | ICD-10-CM | POA: Diagnosis not present

## 2015-12-22 DIAGNOSIS — Z944 Liver transplant status: Secondary | ICD-10-CM | POA: Diagnosis not present

## 2015-12-22 DIAGNOSIS — Z4823 Encounter for aftercare following liver transplant: Secondary | ICD-10-CM | POA: Diagnosis not present

## 2015-12-29 DIAGNOSIS — Z4823 Encounter for aftercare following liver transplant: Secondary | ICD-10-CM | POA: Diagnosis not present

## 2015-12-29 DIAGNOSIS — B259 Cytomegaloviral disease, unspecified: Secondary | ICD-10-CM | POA: Diagnosis not present

## 2015-12-29 DIAGNOSIS — Z944 Liver transplant status: Secondary | ICD-10-CM | POA: Diagnosis not present

## 2015-12-29 LAB — BASIC METABOLIC PANEL
Creatinine: 1 mg/dL (ref 0.6–1.3)
Sodium: 140 mmol/L (ref 137–147)

## 2015-12-29 LAB — HEPATIC FUNCTION PANEL
ALK PHOS: 84 U/L (ref 25–125)
ALT: 26 U/L (ref 10–40)
AST: 27 U/L (ref 14–40)
BILIRUBIN, TOTAL: 0.7 mg/dL

## 2015-12-29 LAB — CBC AND DIFFERENTIAL
HCT: 38 % — AB (ref 41–53)
HEMOGLOBIN: 13.1 g/dL — AB (ref 13.5–17.5)
PLATELETS: 209 10*3/uL (ref 150–399)
WBC: 5.1 10^3/mL

## 2016-01-05 DIAGNOSIS — B259 Cytomegaloviral disease, unspecified: Secondary | ICD-10-CM | POA: Diagnosis not present

## 2016-01-05 DIAGNOSIS — Z944 Liver transplant status: Secondary | ICD-10-CM | POA: Diagnosis not present

## 2016-01-05 DIAGNOSIS — Z4823 Encounter for aftercare following liver transplant: Secondary | ICD-10-CM | POA: Diagnosis not present

## 2016-01-11 ENCOUNTER — Other Ambulatory Visit: Payer: Self-pay | Admitting: Nurse Practitioner

## 2016-01-11 ENCOUNTER — Other Ambulatory Visit: Payer: Self-pay | Admitting: *Deleted

## 2016-01-11 DIAGNOSIS — Z944 Liver transplant status: Secondary | ICD-10-CM | POA: Diagnosis not present

## 2016-01-11 DIAGNOSIS — C220 Liver cell carcinoma: Secondary | ICD-10-CM

## 2016-01-11 DIAGNOSIS — B182 Chronic viral hepatitis C: Secondary | ICD-10-CM | POA: Diagnosis not present

## 2016-01-11 NOTE — Patient Outreach (Addendum)
[place] [facility]) Care Management  01/11/2016  Jeffery York 04-27-1956 [id number]    RN Health Coach  attempted #1 outreach call to patient.  Patient was unavailable. HIPPA compliance message was left with return callback number with wife.   Plan: RN will call patient again within 14 days.    [place] Care Management [phone]

## 2016-01-12 DIAGNOSIS — B259 Cytomegaloviral disease, unspecified: Secondary | ICD-10-CM | POA: Diagnosis not present

## 2016-01-12 DIAGNOSIS — Z944 Liver transplant status: Secondary | ICD-10-CM | POA: Diagnosis not present

## 2016-01-12 DIAGNOSIS — Z4823 Encounter for aftercare following liver transplant: Secondary | ICD-10-CM | POA: Diagnosis not present

## 2016-01-17 ENCOUNTER — Encounter: Payer: Self-pay | Admitting: Internal Medicine

## 2016-01-19 ENCOUNTER — Other Ambulatory Visit: Payer: Self-pay | Admitting: *Deleted

## 2016-01-19 DIAGNOSIS — Z944 Liver transplant status: Secondary | ICD-10-CM | POA: Diagnosis not present

## 2016-01-19 DIAGNOSIS — Z4823 Encounter for aftercare following liver transplant: Secondary | ICD-10-CM | POA: Diagnosis not present

## 2016-01-19 DIAGNOSIS — B259 Cytomegaloviral disease, unspecified: Secondary | ICD-10-CM | POA: Diagnosis not present

## 2016-01-19 NOTE — Patient Outreach (Signed)
[place] [facility]) Care Management  01/19/2016  JAVAE HATHCOAT [birth year] [id number]  RN Health Coach  attempted #1 Follow up outreach call to patient.  Patient was unavailable. HIPPA compliance  message was left with  Wife and return callback number.   Plan: RN will call patient again within 14 days.    [place] Care Management [phone]

## 2016-01-21 ENCOUNTER — Ambulatory Visit
Admission: RE | Admit: 2016-01-21 | Discharge: 2016-01-21 | Disposition: A | Discharge status: Home / Self Care | Payer: Medicare Other | Source: Ambulatory Visit | Attending: Nurse Practitioner | Admitting: Nurse Practitioner

## 2016-01-21 DIAGNOSIS — C220 Liver cell carcinoma: Secondary | ICD-10-CM

## 2016-01-21 MED ORDER — IOPAMIDOL (ISOVUE-300) INJECTION 61%
100.0000 mL | Freq: Once | INTRAVENOUS | Status: AC | PRN
  Administered 2016-01-21: 100 mL via INTRAVENOUS

## 2016-01-26 DIAGNOSIS — Z4823 Encounter for aftercare following liver transplant: Secondary | ICD-10-CM | POA: Diagnosis not present

## 2016-01-26 DIAGNOSIS — Z944 Liver transplant status: Secondary | ICD-10-CM | POA: Diagnosis not present

## 2016-01-26 DIAGNOSIS — B259 Cytomegaloviral disease, unspecified: Secondary | ICD-10-CM | POA: Diagnosis not present

## 2016-01-31 ENCOUNTER — Other Ambulatory Visit: Payer: Self-pay | Admitting: Internal Medicine

## 2016-02-01 ENCOUNTER — Other Ambulatory Visit: Payer: Self-pay | Admitting: *Deleted

## 2016-02-01 NOTE — Patient Outreach (Signed)
[facility]) Care Management  02/01/2016  Jeffery York January 03, 1956 [id number]   RN Health Coach telephone call to patient.  Hipaa compliance verified. Per member he did receive the educational material that was sent. Member stated that he has not read over the material due to a death in the family. Member has agreed to outreach calls.  Plan: RN will call patient again within the month of September.  [place] Care Management [phone]

## 2016-02-02 DIAGNOSIS — B259 Cytomegaloviral disease, unspecified: Secondary | ICD-10-CM | POA: Diagnosis not present

## 2016-02-02 DIAGNOSIS — Z944 Liver transplant status: Secondary | ICD-10-CM | POA: Diagnosis not present

## 2016-02-02 DIAGNOSIS — Z4823 Encounter for aftercare following liver transplant: Secondary | ICD-10-CM | POA: Diagnosis not present

## 2016-02-04 ENCOUNTER — Ambulatory Visit (INDEPENDENT_AMBULATORY_CARE_PROVIDER_SITE_OTHER): Payer: Medicare Other | Admitting: Medical

## 2016-02-04 ENCOUNTER — Encounter: Payer: Self-pay | Admitting: Medical

## 2016-02-04 VITALS — BP 122/70 | HR 61 | Temp 98.1°F | Wt 171.6 lb

## 2016-02-04 DIAGNOSIS — M109 Gout, unspecified: Secondary | ICD-10-CM

## 2016-02-04 LAB — COMPREHENSIVE METABOLIC PANEL
ALBUMIN: 4.4 g/dL (ref 3.6–5.1)
ALT: 30 U/L (ref 9–46)
AST: 29 U/L (ref 10–35)
Alkaline Phosphatase: 70 U/L (ref 40–115)
BUN: 26 mg/dL — ABNORMAL HIGH (ref 7–25)
CALCIUM: 9 mg/dL (ref 8.6–10.3)
CHLORIDE: 101 mmol/L (ref 98–110)
CO2: 24 mmol/L (ref 20–31)
Creat: 1.34 mg/dL — ABNORMAL HIGH (ref 0.70–1.25)
Glucose, Bld: 72 mg/dL (ref 65–99)
Potassium: 4.8 mmol/L (ref 3.5–5.3)
SODIUM: 133 mmol/L — AB (ref 135–146)
Total Bilirubin: 1.2 mg/dL (ref 0.2–1.2)
Total Protein: 7.7 g/dL (ref 6.1–8.1)

## 2016-02-04 LAB — URIC ACID: Uric Acid, Serum: 7.9 mg/dL (ref 4.0–8.0)

## 2016-02-04 MED ORDER — PREDNISONE 10 MG PO TABS: ORAL_TABLET | ORAL | 0 refills | Status: DC

## 2016-02-04 NOTE — Progress Notes (Signed)
Pre visit review using our clinic review tool, if applicable. No additional management support is needed unless otherwise documented below in the visit note. 

## 2016-02-04 NOTE — Progress Notes (Signed)
Subjective:    Patient ID: Jeffery York, male    DOB: 1955/07/30, 60 y.o.   MRN: [id number]  HPI  Pt in with some rt foot pain. He points to rt foot great toe area. Feels slight warm, pain at base of rt great toe. Pain for one day. He states feels like prior gout and wants to stop before gets out of hand.  Pt history of liver transplant. Had transplant 10 months ago.   When he walks is mild to moderate pain.  No trauma.   This is where he had prior gout flare in past.  Pt is diabetic. A1-C was controlled recently. Last one was 4.8. Recently in am some sugars slight below 60.  Pt had beef, caffeine and ate fried foods night before last.   Prior xray had some findings of possible gout.     Review of Systems  Constitutional: Negative for chills, fatigue and fever.  Respiratory: Negative for cough, shortness of breath and wheezing.   Cardiovascular: Negative for chest pain and palpitations.  Musculoskeletal:       Rt great toe region pain at base of toe. See hpi.   Past Medical History:  Diagnosis Date  . Benign prostatic hypertrophy   . Cirrhosis ([place])   . Diabetes mellitus    initially 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 but then the diabetes resurface without the use of steroids  . [place] (hepatocellular carcinoma) ([place])    s/p TACE and microwave ablation  . Hep C w/o coma, chronic (HCC)    bx aprox 12-10, s/p Harvoni, on a transplant list candidate   . History of sarcoidosis    used steroids x 10 years  . Hyperlipidemia   . Hypertension   . 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 Uveitis    h/o     Social History   Social History  . Marital status: Married    Spouse name: N/A  . Number of children: 0  . Years of education: N/A   Occupational History  . retired Nature conservation officer    Social History Main Topics  . Smoking status: Former Research scientist (life sciences)  . Smokeless tobacco: Never Used     Comment: remote smoker  . Alcohol use Yes     Comment: rarely   . Drug use: No  . Sexual activity:  Not on file   Other Topics Concern  . Not on file   Social History Narrative   Mother- Lance Bosch one of my patients, deceased ~ 23-Sep-2011   Married, No children     Past Surgical History:  Procedure Laterality Date  . LIVER TRANSPLANT  04-2015  . lung resection aspergilloma  09-23-98    Family History  Problem Relation Age of Onset  . Prostate cancer Brother     at age 65s  . Heart attack Brother     at age 80  . Lung cancer Father   . Diabetes Other     father side   . Colon cancer Neg Hx     Allergies  Allergen Reactions  . Erythromycin Ethylsuccinate     REACTION: GI Intolerance/vomiting  . Keflex [Cephalexin] Diarrhea    With cramps and Nausea    Current Outpatient Prescriptions on File Prior to Visit  Medication Sig Dispense Refill  . amLODipine (NORVASC) 10 MG tablet Take 1 tablet (10 mg total) by mouth daily. 30 tablet 0  . amLODipine (NORVASC) 10 MG tablet Take 1 tablet (10 mg total) by mouth  daily. 90 tablet 2  . aspirin 81 MG tablet Take 81 mg by mouth daily.      . B-D ULTRAFINE III SHORT PEN 31G X 8 MM MISC Reported on 09/16/2015  99  . benazepril (LOTENSIN) 20 MG tablet Take 1 tablet (20 mg total) by mouth daily. 90 tablet 1  . Blood Glucose Monitoring Suppl (FREESTYLE LITE) DEVI Reported on 09/16/2015  0  . glucose blood (ONE TOUCH TEST STRIPS) test strip Use as instructed. Check blood sugar twice daily. 100 each 11  . Lancets (FREESTYLE) lancets Reported on 09/16/2015  11  . LEVEMIR FLEXTOUCH 100 UNIT/ML Pen 20 Units daily.     . magnesium oxide (MAG-OX) 400 MG tablet Take 400 mg by mouth daily.    Marland Kitchen sulfamethoxazole-trimethoprim (BACTRIM,SEPTRA) 400-80 MG tablet 1 tablet by mouth on M, W, F    . tacrolimus (PROGRAF) 5 MG capsule Take 5 mg by mouth 2 (two) times daily.    . valGANciclovir (VALCYTE) 450 MG tablet Take 900 mg by mouth daily.     No current facility-administered medications on file prior to visit.     BP 122/70 (BP Location: Left Arm,  Patient Position: Sitting, Cuff Size: Normal)   Pulse 61   Temp 98.1 F (36.7 C) (Oral)   Wt 171 lb 9.6 oz (77.8 kg)   SpO2 95%   BMI 26.09 kg/m       Objective:   Physical Exam  General- No acute distress. Pleasant patient. Lungs- Clear, even and unlabored. Heart- regular rate and rhythm. Neurologic- CNII- XII grossly intact.  Rt foot- base of rt great toe mild red and inflamed. Faint tender and faint warm.      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 I am going to rx prednisone. After reviewing potential side effects of med option in light of your liver trasnplant hx, I think it  is best to give taper prednisone. Dr. Larose Kells and pharmacist agree.  Keep close eye on your sugars while on but since your sugars are so tightly controlled you can probably tolerate prednisone well. If by chance sugar are increasing then you could titrate up on your insulin.  Follow up in 5-7 days or as needed

## 2016-02-04 NOTE — Patient Instructions (Addendum)
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 I am going to rx prednisone. After reviewing potential side effects of med option in light of your liver trasnplant hx, I think it  is best to give taper prednisone. Dr. Larose Kells and pharmacist agree.  Keep close eye on your sugars while on but since your sugars are so tightly controlled you can probably tolerate prednisone well. If by chance sugar are increasing then you could titrate up on your insulin.  Follow up in 5-7 days or as needed

## 2016-02-09 DIAGNOSIS — Z944 Liver transplant status: Secondary | ICD-10-CM | POA: Diagnosis not present

## 2016-02-09 DIAGNOSIS — Z4823 Encounter for aftercare following liver transplant: Secondary | ICD-10-CM | POA: Diagnosis not present

## 2016-02-09 DIAGNOSIS — B259 Cytomegaloviral disease, unspecified: Secondary | ICD-10-CM | POA: Diagnosis not present

## 2016-02-16 DIAGNOSIS — B259 Cytomegaloviral disease, unspecified: Secondary | ICD-10-CM | POA: Diagnosis not present

## 2016-02-16 DIAGNOSIS — Z4823 Encounter for aftercare following liver transplant: Secondary | ICD-10-CM | POA: Diagnosis not present

## 2016-02-16 DIAGNOSIS — Z944 Liver transplant status: Secondary | ICD-10-CM | POA: Diagnosis not present

## 2016-02-23 DIAGNOSIS — Z944 Liver transplant status: Secondary | ICD-10-CM | POA: Diagnosis not present

## 2016-02-23 DIAGNOSIS — B259 Cytomegaloviral disease, unspecified: Secondary | ICD-10-CM | POA: Diagnosis not present

## 2016-02-23 DIAGNOSIS — Z4823 Encounter for aftercare following liver transplant: Secondary | ICD-10-CM | POA: Diagnosis not present

## 2016-02-28 ENCOUNTER — Telehealth: Payer: Self-pay | Admitting: Internal Medicine

## 2016-02-28 NOTE — Telephone Encounter (Signed)
Pt seen by Percell Miller 02/04/2016 for gout. Will forward to him for advice.

## 2016-02-28 NOTE — Telephone Encounter (Signed)
Patient stating that his gout is flaring up and it is to the point of pain. He would like to know if he is able to get another prescription he said he was given predniSONE (DELTASONE) 10 MG tablet last time. Or does he need to be seen?    Patient Relation: Self Patient Phone:[phone] Pharmacy:  [place], [place]

## 2016-02-29 MED ORDER — COLCHICINE 0.6 MG PO TABS: ORAL_TABLET | ORAL | 1 refills | Status: DC

## 2016-02-29 NOTE — Telephone Encounter (Signed)
Patient returned call. States his BS have been back to normal since stopping the Prednisone. AM below 120's and PM 150 or less. States his pain comes from the joint of his great toe on Right foot. States his Liver Dr. Roney Jaffe Colchicine would be better fro him since he is Diabetic.

## 2016-02-29 NOTE — Telephone Encounter (Signed)
I tried to call pt and no answer. Before deciding if could just give med need to now is sugars have been tightly controlled and which joint he is having pain in. He has hx of gout and diabetes.   So I need to get now this before deciding if can just refill his prednisone. Also my half day so could you update me fast if you get in touch with him.  If you get in touch with him in afternoon may be best for other to see him.

## 2016-02-29 NOTE — Telephone Encounter (Signed)
Pt describes early gout flare in same big toe as his prior gout flare was early september. Pt states his liver specialist stated ok for him to be on colcrys. Pt pain was worse yesterday than today. He will follow up in 2 weeks. But if needs to be seen before the weekend,  I offered him appointment on Friday. Pt agreed.\  Note current description of pain is mild.

## 2016-03-01 DIAGNOSIS — Z944 Liver transplant status: Secondary | ICD-10-CM | POA: Diagnosis not present

## 2016-03-01 DIAGNOSIS — Z4823 Encounter for aftercare following liver transplant: Secondary | ICD-10-CM | POA: Diagnosis not present

## 2016-03-01 DIAGNOSIS — B259 Cytomegaloviral disease, unspecified: Secondary | ICD-10-CM | POA: Diagnosis not present

## 2016-03-03 ENCOUNTER — Ambulatory Visit: Payer: Self-pay | Admitting: *Deleted

## 2016-03-08 DIAGNOSIS — Z4823 Encounter for aftercare following liver transplant: Secondary | ICD-10-CM | POA: Diagnosis not present

## 2016-03-08 DIAGNOSIS — B259 Cytomegaloviral disease, unspecified: Secondary | ICD-10-CM | POA: Diagnosis not present

## 2016-03-08 DIAGNOSIS — Z944 Liver transplant status: Secondary | ICD-10-CM | POA: Diagnosis not present

## 2016-03-15 DIAGNOSIS — Z4823 Encounter for aftercare following liver transplant: Secondary | ICD-10-CM | POA: Diagnosis not present

## 2016-03-15 DIAGNOSIS — Z944 Liver transplant status: Secondary | ICD-10-CM | POA: Diagnosis not present

## 2016-03-15 DIAGNOSIS — B259 Cytomegaloviral disease, unspecified: Secondary | ICD-10-CM | POA: Diagnosis not present

## 2016-03-22 DIAGNOSIS — B259 Cytomegaloviral disease, unspecified: Secondary | ICD-10-CM | POA: Diagnosis not present

## 2016-03-22 DIAGNOSIS — Z4823 Encounter for aftercare following liver transplant: Secondary | ICD-10-CM | POA: Diagnosis not present

## 2016-03-22 DIAGNOSIS — Z944 Liver transplant status: Secondary | ICD-10-CM | POA: Diagnosis not present

## 2016-03-28 ENCOUNTER — Telehealth: Payer: Self-pay | Admitting: Family Medicine

## 2016-03-28 NOTE — Telephone Encounter (Signed)
Error

## 2016-03-29 ENCOUNTER — Encounter: Payer: Self-pay | Admitting: *Deleted

## 2016-03-29 ENCOUNTER — Other Ambulatory Visit: Payer: Self-pay | Admitting: *Deleted

## 2016-03-29 DIAGNOSIS — B259 Cytomegaloviral disease, unspecified: Secondary | ICD-10-CM | POA: Diagnosis not present

## 2016-03-29 DIAGNOSIS — Z944 Liver transplant status: Secondary | ICD-10-CM | POA: Diagnosis not present

## 2016-03-29 DIAGNOSIS — Z4823 Encounter for aftercare following liver transplant: Secondary | ICD-10-CM | POA: Diagnosis not present

## 2016-03-29 NOTE — Patient Outreach (Signed)
[place] [facility]) Care Management  03/29/2016  RAYLYN ARONS 1955-11-27 [id number]    RN Health Coach  Attempted #4  Follow up outreach call to patient.  Patient was unavailable. HIPPA compliance voicemail message was left with return callback number.   Plan: RN will send unsuccessful outreach letter to patient and case closure within 10 business days  [place] Management [phone]

## 2016-04-05 DIAGNOSIS — Z4823 Encounter for aftercare following liver transplant: Secondary | ICD-10-CM | POA: Diagnosis not present

## 2016-04-05 DIAGNOSIS — Z944 Liver transplant status: Secondary | ICD-10-CM | POA: Diagnosis not present

## 2016-04-05 DIAGNOSIS — B259 Cytomegaloviral disease, unspecified: Secondary | ICD-10-CM | POA: Diagnosis not present

## 2016-04-12 ENCOUNTER — Encounter: Payer: Self-pay | Admitting: *Deleted

## 2016-04-12 ENCOUNTER — Ambulatory Visit: Payer: Self-pay | Admitting: *Deleted

## 2016-04-12 DIAGNOSIS — Z944 Liver transplant status: Secondary | ICD-10-CM | POA: Diagnosis not present

## 2016-04-12 DIAGNOSIS — Z4823 Encounter for aftercare following liver transplant: Secondary | ICD-10-CM | POA: Diagnosis not present

## 2016-04-12 DIAGNOSIS — B259 Cytomegaloviral disease, unspecified: Secondary | ICD-10-CM | POA: Diagnosis not present

## 2016-04-19 DIAGNOSIS — B259 Cytomegaloviral disease, unspecified: Secondary | ICD-10-CM | POA: Diagnosis not present

## 2016-04-19 DIAGNOSIS — Z4823 Encounter for aftercare following liver transplant: Secondary | ICD-10-CM | POA: Diagnosis not present

## 2016-04-19 DIAGNOSIS — Z944 Liver transplant status: Secondary | ICD-10-CM | POA: Diagnosis not present

## 2016-04-26 DIAGNOSIS — Z944 Liver transplant status: Secondary | ICD-10-CM | POA: Diagnosis not present

## 2016-04-26 DIAGNOSIS — Z4823 Encounter for aftercare following liver transplant: Secondary | ICD-10-CM | POA: Diagnosis not present

## 2016-04-26 DIAGNOSIS — B259 Cytomegaloviral disease, unspecified: Secondary | ICD-10-CM | POA: Diagnosis not present

## 2016-05-03 DIAGNOSIS — Z944 Liver transplant status: Secondary | ICD-10-CM | POA: Diagnosis not present

## 2016-05-03 DIAGNOSIS — B259 Cytomegaloviral disease, unspecified: Secondary | ICD-10-CM | POA: Diagnosis not present

## 2016-05-03 DIAGNOSIS — Z4823 Encounter for aftercare following liver transplant: Secondary | ICD-10-CM | POA: Diagnosis not present

## 2016-05-10 DIAGNOSIS — Z944 Liver transplant status: Secondary | ICD-10-CM | POA: Diagnosis not present

## 2016-05-10 DIAGNOSIS — B259 Cytomegaloviral disease, unspecified: Secondary | ICD-10-CM | POA: Diagnosis not present

## 2016-05-10 DIAGNOSIS — Z4823 Encounter for aftercare following liver transplant: Secondary | ICD-10-CM | POA: Diagnosis not present

## 2016-05-16 ENCOUNTER — Other Ambulatory Visit: Payer: Self-pay

## 2016-05-16 DIAGNOSIS — B259 Cytomegaloviral disease, unspecified: Secondary | ICD-10-CM | POA: Diagnosis not present

## 2016-05-16 DIAGNOSIS — Z944 Liver transplant status: Secondary | ICD-10-CM | POA: Diagnosis not present

## 2016-05-16 DIAGNOSIS — Z4823 Encounter for aftercare following liver transplant: Secondary | ICD-10-CM | POA: Diagnosis not present

## 2016-05-16 NOTE — Progress Notes (Signed)
Pre visit review using our clinic review tool, if applicable. No additional management support is needed unless otherwise documented below in the visit note. 

## 2016-05-16 NOTE — Progress Notes (Addendum)
Subjective:   Jeffery York is a 60 y.o. male who presents for Medicare Annual/Subsequent preventive examination.  Review of Systems:  No ROS.  Medicare Wellness Visit.  Cardiac Risk Factors include: advanced age (>74men, >41 women);dyslipidemia;diabetes mellitus;hypertension;male gender Sleep patterns: Sleeps 7-8 hours per night. Feels rested. Home Safety/Smoke Alarms:  Smoke detectors and security alarms in place. Living environment; residence and Firearm Safety: Lives with wife. Firearms are properly stored. Feels safe. Seat Belt Safety/Bike Helmet: Wears seatbelt.   Counseling:   Eye Exam-Wears glasses at all times. [facility] 2 months ago.  [facility]- [facility].  Male:   CCS- Last on 10/16/08: Moderate diverticulosis. Otherwise normal. F/u 10 yr per report.  PSA-   Lab Results  Component Value Date   PSA 1.04 03/30/2015   PSA 3.41 07/23/2014   PSA 1.38 01/20/2013        Objective:    Vitals: BP 122/68 (BP Location: Left Arm, Patient Position: Sitting, Cuff Size: Normal)   Pulse 61   Temp 97.7 F (36.5 C) (Oral)   Resp 14   Ht 5\' 8"  (1.727 m)   Wt 171 lb 2 oz (77.6 kg)   SpO2 98%   BMI 26.02 kg/m   Body mass index is 26.02 kg/m.  Tobacco History  Smoking Status  . Former Smoker  Smokeless Tobacco  . Never Used    Comment: remote smoker     Counseling given: No   Past Medical History:  Diagnosis Date  . Benign prostatic hypertrophy   . Cirrhosis ([place])   . Diabetes mellitus    initially 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 but then the diabetes resurface without the use of steroids  . [place] (hepatocellular carcinoma) ([place])    s/p TACE and microwave ablation  . Hep C w/o coma, chronic (HCC)    bx aprox 12-10, s/p Harvoni, on a transplant list candidate   . History of sarcoidosis    used steroids x 10 years  . Hyperlipidemia   . Hypertension   . 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 Uveitis    h/o   Past Surgical History:  Procedure  Laterality Date  . LIVER TRANSPLANT  04-2015  . lung resection aspergilloma  2000   Family History  Problem Relation Age of Onset  . Prostate cancer Brother     at age 96s  . Heart attack Brother     at age 58  . Lung cancer Father   . Diabetes Other     father side   . Colon cancer Neg Hx    History  Sexual Activity  . Sexual activity: Yes    Outpatient Encounter Prescriptions as of 05/17/2016  Medication Sig  . amLODipine (NORVASC) 10 MG tablet Take 1 tablet (10 mg total) by mouth daily.  Marland Kitchen aspirin 81 MG tablet Take 81 mg by mouth daily.    . benazepril (LOTENSIN) 20 MG tablet Take 1 tablet (20 mg total) by mouth daily.  . cetirizine (ZYRTEC) 10 MG tablet Take 10 mg by mouth daily.  . fluticasone (FLONASE) 50 MCG/ACT nasal spray Place 2 sprays into both nostrils daily.  Marland Kitchen LEVEMIR FLEXTOUCH 100 UNIT/ML Pen 20 Units daily.   . magnesium oxide (MAG-OX) 400 MG tablet Take 400 mg by mouth daily.  Marland Kitchen sulfamethoxazole-trimethoprim (BACTRIM,SEPTRA) 400-80 MG tablet 1 tablet by mouth on M, W, F  . tacrolimus (PROGRAF) 5 MG capsule Take 5 mg by mouth 2 (two) times  daily.  . B-D ULTRAFINE III SHORT PEN 31G X 8 MM MISC Reported on 09/16/2015  . Blood Glucose Monitoring Suppl (FREESTYLE LITE) DEVI Reported on 09/16/2015  . colchicine 0.6 MG tablet 1 tab po bid (Patient not taking: Reported on 05/17/2016)  . glucose blood (ONE TOUCH TEST STRIPS) test strip Use as instructed. Check blood sugar twice daily. (Patient not taking: Reported on 05/17/2016)  . Lancets (FREESTYLE) lancets Reported on 09/16/2015  . [DISCONTINUED] predniSONE (DELTASONE) 10 MG tablet 6 tab po day 1, 5 tab po day 2, 4 tab po day 3, 3 tab po day 4, 2 tab po day 5, 1 tab po day 6 (Patient not taking: Reported on 05/17/2016)  . [DISCONTINUED] valGANciclovir (VALCYTE) 450 MG tablet Take 900 mg by mouth daily.   No facility-administered encounter medications on file as of 05/17/2016.     Activities of Daily Living In your  present state of health, do you have any difficulty performing the following activities: 05/17/2016 12/13/2015  Hearing? N Y  Vision? N -  Difficulty concentrating or making decisions? N Y  Walking or climbing stairs? N N  Dressing or bathing? N N  Doing errands, shopping? N Y  Conservation officer, nature and eating ? N N  Using the Toilet? N N  In the past six months, have you accidently leaked urine? - N  Do you have problems with loss of bowel control? N N  Managing your Medications? N N  Managing your Finances? N N  Housekeeping or managing your Housekeeping? N N  Some recent data might be hidden    Patient Care Team: Colon Branch, MD as PCP - General Roosevelt Locks, CRNP as Nurse Practitioner (Nurse Practitioner) Rickey Barbara, MD as Referring Physician (Internal Medicine) Kirkland Hun, MD as Attending Physician (Internal Medicine) Genia Del, MD (Internal Medicine) Renato Shin, MD as Consulting Physician (Endocrinology)   Assessment:    Physical assessment deferred to PCP.  Exercise Activities and Dietary recommendations Current Exercise Habits: Structured exercise class, Type of exercise: yoga (cycling), Time (Minutes): 60 (10 miles), Frequency (Times/Week): 7, Weekly Exercise (Minutes/Week): 420, Intensity: Moderate   Diet: Eats extremely healthy. Follows diet for gout and liver transplant. Drinks adequate water.   Goals    . Read the bible front to back and grow spiritually.      Fall Risk Fall Risk  05/17/2016 12/13/2015 09/16/2015 05/21/2013 05/21/2013  Falls in the past year? No No No No No   Depression Screen PHQ 2/9 Scores 05/17/2016 12/13/2015 09/16/2015 05/21/2013  PHQ - 2 Score 0 0 0 0    Cognitive Function MMSE - Mini Mental State Exam 05/17/2016  Orientation to time 5  Orientation to Place 5  Registration 3  Attention/ Calculation 5  Recall 3  Language- name 2 objects 2  Language- repeat 1  Language- follow 3 step command 3  Language- read & follow  direction 1  Write a sentence 1  Copy design 1  Total score 30        Immunization History  Administered Date(s) Administered  . H1N1 06/23/2008  . Hep A / Hep B 05/26/2013  . Influenza Split 05/05/2011  . Influenza Whole 04/16/2007, 03/09/2009, 04/05/2010  . Influenza, Seasonal, Injecte, Preservative Fre 04/30/2013  . Influenza-Unspecified 02/04/2015  . Pneumococcal Conjugate-13 07/23/2014  . Pneumococcal Polysaccharide-23 03/05/2006, 05/21/2013  . Td 06/05/2002  . Tdap 05/21/2013  . Zoster 11/04/2014   Screening Tests Health Maintenance  Topic Date Due  . OPHTHALMOLOGY  EXAM  05/11/1964  . HIV Screening  05/11/1969  . INFLUENZA VACCINE  01/04/2016  . HEMOGLOBIN A1C  01/31/2016  . FOOT EXAM  09/15/2016  . COLONOSCOPY  10/17/2018  . TETANUS/TDAP  05/22/2023  . PNEUMOCOCCAL POLYSACCHARIDE VACCINE  Completed  . ZOSTAVAX  Completed  . Hepatitis C Screening  Completed      Plan:     Follow gout diet. Maintainhealthy lifestyle.  Bring a copy of your advance directives to your next office visit.   During the course of the visit the patient was educated and counseled about the following appropriate screening and preventive services:   Vaccines to include Pneumoccal, Influenza, Hepatitis B, Td, Zostavax, HCV  Cardiovascular Disease  Colorectal cancer screening  Diabetes screening  Prostate Cancer Screening  Glaucoma screening  Nutrition counseling    Patient Instructions (the written plan) was given to the patient.    Naaman Plummer [place], [place]  05/17/2016  Kathlene November, MD

## 2016-05-17 ENCOUNTER — Encounter: Payer: Self-pay | Admitting: Internal Medicine

## 2016-05-17 ENCOUNTER — Ambulatory Visit (INDEPENDENT_AMBULATORY_CARE_PROVIDER_SITE_OTHER): Payer: Medicare Other | Admitting: Internal Medicine

## 2016-05-17 VITALS — BP 122/68 | HR 61 | Temp 97.7°F | Resp 14 | Ht 68.0 in | Wt 171.1 lb

## 2016-05-17 DIAGNOSIS — Z0000 Encounter for general adult medical examination without abnormal findings: Secondary | ICD-10-CM

## 2016-05-17 DIAGNOSIS — Z794 Long term (current) use of insulin: Secondary | ICD-10-CM

## 2016-05-17 DIAGNOSIS — I10 Essential (primary) hypertension: Secondary | ICD-10-CM

## 2016-05-17 DIAGNOSIS — M109 Gout, unspecified: Secondary | ICD-10-CM | POA: Diagnosis not present

## 2016-05-17 DIAGNOSIS — E119 Type 2 diabetes mellitus without complications: Secondary | ICD-10-CM

## 2016-05-17 LAB — BASIC METABOLIC PANEL
BUN: 20 mg/dL (ref 6–23)
CALCIUM: 9.6 mg/dL (ref 8.4–10.5)
CO2: 29 meq/L (ref 19–32)
CREATININE: 1.13 mg/dL (ref 0.40–1.50)
Chloride: 102 mEq/L (ref 96–112)
GFR: 84.56 mL/min (ref >60.00)
Glucose, Bld: 105 mg/dL — ABNORMAL HIGH (ref 70–99)
Potassium: 4.9 mEq/L (ref 3.5–5.1)
SODIUM: 137 meq/L (ref 135–145)

## 2016-05-17 NOTE — Assessment & Plan Note (Signed)
DM: Recommend to see Dr. Loanne Drilling. HTN, BP is controlled, continue with Lotensin. Gout: Had a episode of what seems to be classic podagra, it quickly resolved with prednisone, on colchicine as needed. Check a BMP (last creatinine was 1.3). Liver issues: Follow-up elsewhere. RTC April 2018, CPX.

## 2016-05-17 NOTE — Patient Instructions (Addendum)
GO TO THE LAB : Get the blood work     GO TO THE FRONT DESK Schedule your next appointment for a  Yearly check up in April 2018  Please see Dr. Loanne Drilling for your diabetes management  Follow the gout diet we are providing you today  HAPPY HOLIDAYS!!!

## 2016-05-17 NOTE — Progress Notes (Signed)
Subjective:    Patient ID: Jeffery York, male    DOB: [birth year], 60 y.o.   MRN: [id number]  DOS:  05/17/2016 Type of visit - description : Routine checkup Interval history: HTN: Ambulatory BPs 120, 140, occasionally 150. Good med compliance. Had a gout episode, symptoms quickly resolved. Went to the [place] got a flu shot and another shot, name?Marland Kitchen The last time he saw his liver doctors he got good results.    Review of Systems   Past Medical History:  Diagnosis Date  . Benign prostatic hypertrophy   . Cirrhosis ([place])   . Diabetes mellitus    initially 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 but then the diabetes resurface without the use of steroids  . [place] (hepatocellular carcinoma) ([place])    s/p TACE and microwave ablation  . Hep C w/o coma, chronic (HCC)    bx aprox 12-10, s/p Harvoni, on a transplant list candidate   . History of sarcoidosis    used steroids x 10 years  . Hyperlipidemia   . Hypertension   . 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 Uveitis    h/o    Past Surgical History:  Procedure Laterality Date  . LIVER TRANSPLANT  04-2015  . 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 Marital status: Married    Spouse name: N/A  . Number of children: 0  . Years of education: N/A   Occupational History  . retired Nature conservation officer    Social History Main Topics  . Smoking status: Former Research scientist (life sciences)  . Smokeless tobacco: Never Used     Comment: remote smoker  . Alcohol use No  . Drug use: No  . Sexual activity: Yes   Other Topics Concern  . Not on file   Social History Narrative   Mother- Lance Bosch one of my patients, deceased ~ 09-12-11   Married, No children         Medication List       Accurate as of 05/17/16  5:13 PM. Always use your most recent med list.          amLODipine 10 MG tablet Commonly known as:  NORVASC Take 1 tablet (10 mg total) by mouth daily.   aspirin 81 MG tablet Take 81 mg by mouth daily.   B-D ULTRAFINE III SHORT  PEN 31G X 8 MM Misc Generic drug:  Insulin Pen Needle Reported on 09/16/2015   benazepril 20 MG tablet Commonly known as:  LOTENSIN Take 1 tablet (20 mg total) by mouth daily.   cetirizine 10 MG tablet Commonly known as:  ZYRTEC Take 10 mg by mouth daily.   colchicine 0.6 MG tablet 1 tab po bid   fluticasone 50 MCG/ACT nasal spray Commonly known as:  FLONASE Place 2 sprays into both nostrils daily.   freestyle lancets Reported on 09/16/2015   FREESTYLE LITE Devi Reported on 09/16/2015   glucose blood test strip Commonly known as:  ONE TOUCH TEST STRIPS Use as instructed. Check blood sugar twice daily.   LEVEMIR FLEXTOUCH 100 UNIT/ML Pen Generic drug:  Insulin Detemir 20 Units daily.   magnesium oxide 400 MG tablet Commonly known as:  MAG-OX Take 400 mg by mouth daily.   sulfamethoxazole-trimethoprim 400-80 MG tablet Commonly known as:  BACTRIM,SEPTRA 1 tablet by mouth on M, W, F   tacrolimus 5 MG capsule Commonly known as:  PROGRAF Take 5 mg by mouth 2 (two) times daily.  Objective:   Physical Exam BP 122/68 (BP Location: Left Arm, Patient Position: Sitting, Cuff Size: Normal)   Pulse 61   Temp 97.7 F (36.5 C) (Oral)   Resp 14   Ht 5\' 8"  (1.727 m)   Wt 171 lb 2 oz (77.6 kg)   SpO2 98%   BMI 26.02 kg/m  General:   Well developed, well nourished . NAD.  HEENT:  Normocephalic . Face symmetric, atraumatic Lungs:  CTA B Normal respiratory effort, no intercostal retractions, no accessory muscle use. Heart: RRR,  no murmur.  No pretibial edema bilaterally  Skin: Not pale. Not jaundice Neurologic:  alert & oriented X3.  Speech normal, gait appropriate for age and unassisted Psych--  Cognition and judgment appear intact.  Cooperative with normal attention span and concentration.  Behavior appropriate. No anxious or depressed appearing.      Assessment & Plan:   Assessment > DM: per Dr Loanne Drilling -- Initially 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 then it  resurfaced w/o steroids HTN Hyperlipidemia Gout BPH-- failed Flomax before ED ?Osteoporosis steroid-induced: dexa wnl 2010 H/o uveitis Sarcoidosis s/p steroids for 10 years  GI:  f/u at [facility]  -- Liver transplant  04-07-2015 @ [place]   --Hep C: S/p harvoni failure   --Cirrhosis --Hepatocellular ca, s/p TACE- microwave ablation --> failed, had chemo embolization 03-2015  --Reportedly get hepatitis A-B shots from GI   PLAN: DM: Recommend to see Dr. Loanne Drilling. HTN, BP is controlled, continue with Lotensin. Gout: Had a episode of what seems to be classic podagra, it quickly resolved with prednisone, on colchicine as needed. Check a BMP (last creatinine was 1.3). Liver issues: Follow-up elsewhere. RTC April 2018, CPX.

## 2016-05-17 NOTE — Progress Notes (Signed)
Pre visit review using our clinic review tool, if applicable. No additional management support is needed unless otherwise documented below in the visit note. 

## 2016-06-08 ENCOUNTER — Telehealth: Payer: Self-pay | Admitting: Internal Medicine

## 2016-06-08 ENCOUNTER — Other Ambulatory Visit: Payer: Self-pay | Admitting: Internal Medicine

## 2016-06-08 NOTE — Telephone Encounter (Signed)
Please advise 

## 2016-06-08 NOTE — Telephone Encounter (Signed)
Patient would like to use colchicine for gout flare ups but would like to go back on allopurinol for long term gout management. Please advise  Phone: [phone]

## 2016-06-09 NOTE — Telephone Encounter (Signed)
Spoke w/ Pt, informed him of recommendations. Pt verbalized understanding but did mention that his liver clinic did recommend or say he could possibly restart Allopurinol. He will discuss with them.

## 2016-06-09 NOTE — Telephone Encounter (Signed)
Advise patient, I recommend to manage gout with diet and colchicine as needed. Allopurinol can be toxic to the liver, I recommend him to avoid such medication unless he has very frequent gout flareups. Schedule a visit to discuss further if needed

## 2016-06-27 DIAGNOSIS — B259 Cytomegaloviral disease, unspecified: Secondary | ICD-10-CM | POA: Diagnosis not present

## 2016-06-27 DIAGNOSIS — Z944 Liver transplant status: Secondary | ICD-10-CM | POA: Diagnosis not present

## 2016-06-27 DIAGNOSIS — Z4823 Encounter for aftercare following liver transplant: Secondary | ICD-10-CM | POA: Diagnosis not present

## 2016-07-03 DIAGNOSIS — D899 Disorder involving the immune mechanism, unspecified: Secondary | ICD-10-CM | POA: Diagnosis not present

## 2016-07-03 DIAGNOSIS — Z944 Liver transplant status: Secondary | ICD-10-CM | POA: Diagnosis not present

## 2016-07-03 DIAGNOSIS — C220 Liver cell carcinoma: Secondary | ICD-10-CM | POA: Diagnosis not present

## 2016-07-03 DIAGNOSIS — Z8619 Personal history of other infectious and parasitic diseases: Secondary | ICD-10-CM | POA: Diagnosis not present

## 2016-07-27 DIAGNOSIS — Z4823 Encounter for aftercare following liver transplant: Secondary | ICD-10-CM | POA: Diagnosis not present

## 2016-07-27 DIAGNOSIS — B259 Cytomegaloviral disease, unspecified: Secondary | ICD-10-CM | POA: Diagnosis not present

## 2016-07-27 DIAGNOSIS — Z944 Liver transplant status: Secondary | ICD-10-CM | POA: Diagnosis not present

## 2016-08-24 DIAGNOSIS — Z4823 Encounter for aftercare following liver transplant: Secondary | ICD-10-CM | POA: Diagnosis not present

## 2016-08-24 DIAGNOSIS — B259 Cytomegaloviral disease, unspecified: Secondary | ICD-10-CM | POA: Diagnosis not present

## 2016-08-24 DIAGNOSIS — Z944 Liver transplant status: Secondary | ICD-10-CM | POA: Diagnosis not present

## 2016-08-28 DIAGNOSIS — B259 Cytomegaloviral disease, unspecified: Secondary | ICD-10-CM | POA: Diagnosis not present

## 2016-08-28 DIAGNOSIS — Z944 Liver transplant status: Secondary | ICD-10-CM | POA: Diagnosis not present

## 2016-09-04 DIAGNOSIS — Z4823 Encounter for aftercare following liver transplant: Secondary | ICD-10-CM | POA: Diagnosis not present

## 2016-09-04 DIAGNOSIS — Z944 Liver transplant status: Secondary | ICD-10-CM | POA: Diagnosis not present

## 2016-09-04 DIAGNOSIS — B259 Cytomegaloviral disease, unspecified: Secondary | ICD-10-CM | POA: Diagnosis not present

## 2016-09-11 DIAGNOSIS — B259 Cytomegaloviral disease, unspecified: Secondary | ICD-10-CM | POA: Diagnosis not present

## 2016-09-11 DIAGNOSIS — Z4823 Encounter for aftercare following liver transplant: Secondary | ICD-10-CM | POA: Diagnosis not present

## 2016-09-11 DIAGNOSIS — Z944 Liver transplant status: Secondary | ICD-10-CM | POA: Diagnosis not present

## 2016-09-19 DIAGNOSIS — B259 Cytomegaloviral disease, unspecified: Secondary | ICD-10-CM | POA: Diagnosis not present

## 2016-09-19 DIAGNOSIS — Z4823 Encounter for aftercare following liver transplant: Secondary | ICD-10-CM | POA: Diagnosis not present

## 2016-09-19 DIAGNOSIS — Z944 Liver transplant status: Secondary | ICD-10-CM | POA: Diagnosis not present

## 2016-09-22 ENCOUNTER — Ambulatory Visit: Payer: Medicare Other | Admitting: Internal Medicine

## 2016-09-22 ENCOUNTER — Telehealth: Payer: Self-pay | Admitting: Internal Medicine

## 2016-09-22 NOTE — Telephone Encounter (Signed)
Can you forward request to medical records?

## 2016-09-22 NOTE — Telephone Encounter (Signed)
Would PCP be able to advise where orders were placed?

## 2016-09-22 NOTE — Telephone Encounter (Signed)
Pt will need to find out where CT was completed and request from them, do not see in chart where we have any record of it.

## 2016-09-22 NOTE — Telephone Encounter (Signed)
°  Relation to FX:OVAN Call back number: [phone]    Reason for call:  Patient requesting CD from last CT of he's lungs, patient cant remember were PCP referred him too, patient would like report mailed to home, please advise

## 2016-09-22 NOTE — Telephone Encounter (Signed)
Called patient and he is not when he had to scan but believes it was  In 2008 or 2009. Not record of order. Please advise

## 2016-09-25 DIAGNOSIS — Z944 Liver transplant status: Secondary | ICD-10-CM | POA: Diagnosis not present

## 2016-09-25 DIAGNOSIS — B259 Cytomegaloviral disease, unspecified: Secondary | ICD-10-CM | POA: Diagnosis not present

## 2016-09-25 DIAGNOSIS — Z4823 Encounter for aftercare following liver transplant: Secondary | ICD-10-CM | POA: Diagnosis not present

## 2016-10-02 DIAGNOSIS — Z944 Liver transplant status: Secondary | ICD-10-CM | POA: Diagnosis not present

## 2016-10-02 DIAGNOSIS — Z4823 Encounter for aftercare following liver transplant: Secondary | ICD-10-CM | POA: Diagnosis not present

## 2016-10-02 DIAGNOSIS — B259 Cytomegaloviral disease, unspecified: Secondary | ICD-10-CM | POA: Diagnosis not present

## 2016-10-09 DIAGNOSIS — Z4823 Encounter for aftercare following liver transplant: Secondary | ICD-10-CM | POA: Diagnosis not present

## 2016-10-09 DIAGNOSIS — Z944 Liver transplant status: Secondary | ICD-10-CM | POA: Diagnosis not present

## 2016-10-09 DIAGNOSIS — B259 Cytomegaloviral disease, unspecified: Secondary | ICD-10-CM | POA: Diagnosis not present

## 2016-10-13 ENCOUNTER — Encounter: Payer: Medicare Other | Admitting: Internal Medicine

## 2016-10-16 DIAGNOSIS — B259 Cytomegaloviral disease, unspecified: Secondary | ICD-10-CM | POA: Diagnosis not present

## 2016-10-16 DIAGNOSIS — Z944 Liver transplant status: Secondary | ICD-10-CM | POA: Diagnosis not present

## 2016-10-16 DIAGNOSIS — Z4823 Encounter for aftercare following liver transplant: Secondary | ICD-10-CM | POA: Diagnosis not present

## 2016-10-23 DIAGNOSIS — Z944 Liver transplant status: Secondary | ICD-10-CM | POA: Diagnosis not present

## 2016-10-23 DIAGNOSIS — B259 Cytomegaloviral disease, unspecified: Secondary | ICD-10-CM | POA: Diagnosis not present

## 2016-10-23 DIAGNOSIS — Z4823 Encounter for aftercare following liver transplant: Secondary | ICD-10-CM | POA: Diagnosis not present

## 2016-10-31 DIAGNOSIS — Z4823 Encounter for aftercare following liver transplant: Secondary | ICD-10-CM | POA: Diagnosis not present

## 2016-10-31 DIAGNOSIS — Z944 Liver transplant status: Secondary | ICD-10-CM | POA: Diagnosis not present

## 2016-10-31 DIAGNOSIS — B259 Cytomegaloviral disease, unspecified: Secondary | ICD-10-CM | POA: Diagnosis not present

## 2016-11-06 DIAGNOSIS — B259 Cytomegaloviral disease, unspecified: Secondary | ICD-10-CM | POA: Diagnosis not present

## 2016-11-06 DIAGNOSIS — Z4823 Encounter for aftercare following liver transplant: Secondary | ICD-10-CM | POA: Diagnosis not present

## 2016-11-06 DIAGNOSIS — Z944 Liver transplant status: Secondary | ICD-10-CM | POA: Diagnosis not present

## 2016-11-13 DIAGNOSIS — Z4823 Encounter for aftercare following liver transplant: Secondary | ICD-10-CM | POA: Diagnosis not present

## 2016-11-13 DIAGNOSIS — B259 Cytomegaloviral disease, unspecified: Secondary | ICD-10-CM | POA: Diagnosis not present

## 2016-11-13 DIAGNOSIS — Z944 Liver transplant status: Secondary | ICD-10-CM | POA: Diagnosis not present

## 2016-11-20 DIAGNOSIS — Z944 Liver transplant status: Secondary | ICD-10-CM | POA: Diagnosis not present

## 2016-11-20 DIAGNOSIS — B259 Cytomegaloviral disease, unspecified: Secondary | ICD-10-CM | POA: Diagnosis not present

## 2016-11-20 DIAGNOSIS — Z4823 Encounter for aftercare following liver transplant: Secondary | ICD-10-CM | POA: Diagnosis not present

## 2016-11-27 DIAGNOSIS — Z944 Liver transplant status: Secondary | ICD-10-CM | POA: Diagnosis not present

## 2016-11-27 DIAGNOSIS — B259 Cytomegaloviral disease, unspecified: Secondary | ICD-10-CM | POA: Diagnosis not present

## 2016-11-27 DIAGNOSIS — Z4823 Encounter for aftercare following liver transplant: Secondary | ICD-10-CM | POA: Diagnosis not present

## 2016-11-28 ENCOUNTER — Observation Stay (HOSPITAL_COMMUNITY)
Admission: EM | Admit: 2016-11-28 | Discharge: 2016-11-29 | Disposition: A | Discharge status: Home / Self Care | Payer: Medicare Other | Attending: Pulmonary Disease | Admitting: Pulmonary Disease

## 2016-11-28 ENCOUNTER — Inpatient Hospital Stay (HOSPITAL_COMMUNITY): Payer: Medicare Other

## 2016-11-28 ENCOUNTER — Encounter (HOSPITAL_COMMUNITY): Payer: Self-pay | Admitting: Emergency Medicine

## 2016-11-28 DIAGNOSIS — R918 Other nonspecific abnormal finding of lung field: Secondary | ICD-10-CM | POA: Insufficient documentation

## 2016-11-28 DIAGNOSIS — Z85528 Personal history of other malignant neoplasm of kidney: Secondary | ICD-10-CM | POA: Diagnosis not present

## 2016-11-28 DIAGNOSIS — R531 Weakness: Secondary | ICD-10-CM | POA: Diagnosis not present

## 2016-11-28 DIAGNOSIS — Z801 Family history of malignant neoplasm of trachea, bronchus and lung: Secondary | ICD-10-CM | POA: Diagnosis not present

## 2016-11-28 DIAGNOSIS — T360X5A Adverse effect of penicillins, initial encounter: Secondary | ICD-10-CM | POA: Insufficient documentation

## 2016-11-28 DIAGNOSIS — D869 Sarcoidosis, unspecified: Secondary | ICD-10-CM | POA: Insufficient documentation

## 2016-11-28 DIAGNOSIS — Z794 Long term (current) use of insulin: Secondary | ICD-10-CM | POA: Diagnosis not present

## 2016-11-28 DIAGNOSIS — Z7982 Long term (current) use of aspirin: Secondary | ICD-10-CM | POA: Insufficient documentation

## 2016-11-28 DIAGNOSIS — Z833 Family history of diabetes mellitus: Secondary | ICD-10-CM | POA: Insufficient documentation

## 2016-11-28 DIAGNOSIS — N400 Enlarged prostate without lower urinary tract symptoms: Secondary | ICD-10-CM | POA: Diagnosis not present

## 2016-11-28 DIAGNOSIS — R0602 Shortness of breath: Secondary | ICD-10-CM | POA: Insufficient documentation

## 2016-11-28 DIAGNOSIS — Y929 Unspecified place or not applicable: Secondary | ICD-10-CM | POA: Insufficient documentation

## 2016-11-28 DIAGNOSIS — Z881 Allergy status to other antibiotic agents status: Secondary | ICD-10-CM | POA: Diagnosis not present

## 2016-11-28 DIAGNOSIS — B182 Chronic viral hepatitis C: Secondary | ICD-10-CM | POA: Diagnosis not present

## 2016-11-28 DIAGNOSIS — H209 Unspecified iridocyclitis: Secondary | ICD-10-CM | POA: Insufficient documentation

## 2016-11-28 DIAGNOSIS — E1165 Type 2 diabetes mellitus with hyperglycemia: Secondary | ICD-10-CM | POA: Insufficient documentation

## 2016-11-28 DIAGNOSIS — T464X5A Adverse effect of angiotensin-converting-enzyme inhibitors, initial encounter: Secondary | ICD-10-CM | POA: Insufficient documentation

## 2016-11-28 DIAGNOSIS — E785 Hyperlipidemia, unspecified: Secondary | ICD-10-CM | POA: Insufficient documentation

## 2016-11-28 DIAGNOSIS — Z87891 Personal history of nicotine dependence: Secondary | ICD-10-CM | POA: Diagnosis not present

## 2016-11-28 DIAGNOSIS — I10 Essential (primary) hypertension: Secondary | ICD-10-CM | POA: Diagnosis not present

## 2016-11-28 DIAGNOSIS — Z79899 Other long term (current) drug therapy: Secondary | ICD-10-CM | POA: Insufficient documentation

## 2016-11-28 DIAGNOSIS — E119 Type 2 diabetes mellitus without complications: Secondary | ICD-10-CM | POA: Diagnosis not present

## 2016-11-28 DIAGNOSIS — T783XXA Angioneurotic edema, initial encounter: Secondary | ICD-10-CM | POA: Diagnosis not present

## 2016-11-28 DIAGNOSIS — M109 Gout, unspecified: Secondary | ICD-10-CM | POA: Insufficient documentation

## 2016-11-28 DIAGNOSIS — Z944 Liver transplant status: Secondary | ICD-10-CM | POA: Diagnosis not present

## 2016-11-28 DIAGNOSIS — Z8042 Family history of malignant neoplasm of prostate: Secondary | ICD-10-CM | POA: Diagnosis not present

## 2016-11-28 DIAGNOSIS — Z8249 Family history of ischemic heart disease and other diseases of the circulatory system: Secondary | ICD-10-CM | POA: Diagnosis not present

## 2016-11-28 LAB — CBC WITH DIFFERENTIAL/PLATELET
BASOS ABS: 0 10*3/uL (ref 0.0–0.1)
BASOS PCT: 0 %
EOS PCT: 0 %
Eosinophils Absolute: 0 10*3/uL (ref 0.0–0.7)
HCT: 37.1 % — ABNORMAL LOW (ref 39.0–52.0)
Hemoglobin: 12.5 g/dL — ABNORMAL LOW (ref 13.0–17.0)
Lymphocytes Relative: 7 %
Lymphs Abs: 0.8 10*3/uL (ref 0.7–4.0)
MCH: 29.1 pg (ref 26.0–34.0)
MCHC: 33.7 g/dL (ref 30.0–36.0)
MCV: 86.5 fL (ref 78.0–100.0)
MONO ABS: 0.2 10*3/uL (ref 0.1–1.0)
MONOS PCT: 1 %
Neutro Abs: 10.3 10*3/uL — ABNORMAL HIGH (ref 1.7–7.7)
Neutrophils Relative %: 92 %
PLATELETS: 172 10*3/uL (ref 150–400)
RBC: 4.29 MIL/uL (ref 4.22–5.81)
RDW: 13.6 % (ref 11.5–15.5)
WBC: 11.3 10*3/uL — ABNORMAL HIGH (ref 4.0–10.5)

## 2016-11-28 LAB — GLUCOSE, CAPILLARY: Glucose-Capillary: 206 mg/dL — ABNORMAL HIGH (ref 65–99)

## 2016-11-28 LAB — TYPE AND SCREEN
ABO/RH(D): A POS
ANTIBODY SCREEN: NEGATIVE

## 2016-11-28 LAB — ABO/RH: ABO/RH(D): A POS

## 2016-11-28 LAB — MRSA PCR SCREENING: MRSA BY PCR: NEGATIVE

## 2016-11-28 MED ORDER — INSULIN ASPART 100 UNIT/ML ~~LOC~~ SOLN
0.0000 [IU] | SUBCUTANEOUS | Status: DC
  Administered 2016-11-28: 5 [IU] via SUBCUTANEOUS
  Administered 2016-11-29 (×3): 3 [IU] via SUBCUTANEOUS

## 2016-11-28 MED ORDER — METHYLPREDNISOLONE SODIUM SUCC 40 MG IJ SOLR
40.0000 mg | Freq: Four times a day (QID) | INTRAMUSCULAR | Status: DC
  Administered 2016-11-29 (×3): 40 mg via INTRAVENOUS
  Filled 2016-11-28 (×4): qty 1

## 2016-11-28 MED ORDER — WHITE PETROLATUM GEL
Status: AC
  Administered 2016-11-28: 23:00:00
  Filled 2016-11-28: qty 1

## 2016-11-28 MED ORDER — FAMOTIDINE IN NACL 20-0.9 MG/50ML-% IV SOLN
20.0000 mg | Freq: Once | INTRAVENOUS | Status: AC
  Administered 2016-11-28: 20 mg via INTRAVENOUS
  Filled 2016-11-28: qty 50

## 2016-11-28 MED ORDER — SODIUM CHLORIDE 0.9 % IV SOLN
INTRAVENOUS | Status: DC
  Administered 2016-11-28: 19:00:00 via INTRAVENOUS

## 2016-11-28 MED ORDER — FAMOTIDINE IN NACL 20-0.9 MG/50ML-% IV SOLN
20.0000 mg | Freq: Two times a day (BID) | INTRAVENOUS | Status: DC
  Administered 2016-11-29: 20 mg via INTRAVENOUS
  Filled 2016-11-28 (×2): qty 50

## 2016-11-28 MED ORDER — HEPARIN SODIUM (PORCINE) 5000 UNIT/ML IJ SOLN
5000.0000 [IU] | Freq: Three times a day (TID) | INTRAMUSCULAR | Status: DC
  Administered 2016-11-28 – 2016-11-29 (×2): 5000 [IU] via SUBCUTANEOUS
  Filled 2016-11-28: qty 1

## 2016-11-28 MED ORDER — METHYLPREDNISOLONE SODIUM SUCC 125 MG IJ SOLR
125.0000 mg | Freq: Once | INTRAMUSCULAR | Status: AC
  Administered 2016-11-28: 125 mg via INTRAVENOUS
  Filled 2016-11-28: qty 2

## 2016-11-28 MED ORDER — MORPHINE SULFATE (PF) 2 MG/ML IV SOLN
2.0000 mg | INTRAVENOUS | Status: DC | PRN
  Administered 2016-11-28: 2 mg via INTRAVENOUS
  Filled 2016-11-28: qty 1

## 2016-11-28 MED ORDER — DIPHENHYDRAMINE HCL 50 MG/ML IJ SOLN
25.0000 mg | Freq: Four times a day (QID) | INTRAMUSCULAR | Status: DC
  Administered 2016-11-29 (×2): 25 mg via INTRAVENOUS
  Filled 2016-11-28: qty 1
  Filled 2016-11-28: qty 0.5
  Filled 2016-11-28: qty 1

## 2016-11-28 MED ORDER — EPINEPHRINE 0.3 MG/0.3ML IJ SOAJ
INTRAMUSCULAR | Status: AC
  Filled 2016-11-28: qty 0.3

## 2016-11-28 MED ORDER — DIPHENHYDRAMINE HCL 50 MG/ML IJ SOLN
50.0000 mg | Freq: Once | INTRAMUSCULAR | Status: AC
  Administered 2016-11-28: 50 mg via INTRAVENOUS
  Filled 2016-11-28: qty 1

## 2016-11-28 MED ORDER — ORAL CARE MOUTH RINSE
15.0000 mL | Freq: Two times a day (BID) | OROMUCOSAL | Status: DC
  Administered 2016-11-28: 15 mL via OROMUCOSAL

## 2016-11-28 NOTE — ED Provider Notes (Signed)
[facility] DEPT Provider Note   CSN: [id number] Arrival date & time: 11/28/16  1646     History   Chief Complaint Chief Complaint  Patient presents with  . Angioedema    HPI Jeffery York is a 61 y.o. male.  The history is provided by the spouse, medical records and the patient. No language interpreter was used.  Illness  This is a new problem. The current episode started 1 to 2 hours ago. The problem occurs constantly. The problem has been gradually worsening. Nothing relieves the symptoms. He has tried nothing for the symptoms.    Past Medical History:  Diagnosis Date  . Benign prostatic hypertrophy   . Cirrhosis ([place])   . Diabetes mellitus    initially 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 but then the diabetes resurface without the use of steroids  . [place] (hepatocellular carcinoma) ([place])    s/p TACE and microwave ablation  . Hep C w/o coma, chronic (HCC)    bx aprox 12-10, s/p Harvoni, on a transplant list candidate   . History of sarcoidosis    used steroids x 10 years  . Hyperlipidemia   . Hypertension   . 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 Uveitis    h/o    Patient Active Problem List   Diagnosis Date Noted  . Angioedema 11/28/2016  . PCP NOTES >>>>>>> 03/30/2015  . Increased prostate specific antigen (PSA) velocity 11/26/2014  . Gout 04/28/2014  . Syncope ? 05/21/2013  . Shoulder pain 12/05/2011  . Erectile dysfunction 07/18/2011  . Annual physical exam 01/12/2011  . Hepatitis C ---cirrhosis --- s/p HARVONI-- hapatocellular cancer 04/16/2007  . UVEITIS 04/16/2007  . BENIGN PROSTATIC HYPERTROPHY 04/16/2007  . UROLITHIASIS, HX OF 04/16/2007  . SARCOIDOSIS 09/26/2006  . DM II (diabetes mellitus, type II), controlled ([place]) 09/26/2006  . Hyperlipidemia 09/26/2006  . Essential hypertension 09/26/2006  . OSTEOPOROSIS 09/26/2006    Past Surgical History:  Procedure Laterality Date  . LIVER TRANSPLANT  04-2015  . lung resection aspergilloma  2000        Home Medications    Prior to Admission medications   Medication Sig Start Date End Date Taking? Authorizing Provider  ascorbic acid (VITAMIN C) 500 MG tablet Take 500 mg by mouth daily.   Yes [provider]  aspirin 81 MG tablet Take 81 mg by mouth daily.     Yes [provider]  cetirizine (ZYRTEC) 10 MG tablet Take 10 mg by mouth daily.   Yes [provider]  cholecalciferol (VITAMIN D) 1000 units tablet Take 1,500 Units by mouth daily.   Yes [provider]  cyanocobalamin 1000 MCG tablet Take 1,500 mcg by mouth daily.   Yes [provider]  fluticasone (FLONASE) 50 MCG/ACT nasal spray Place 2 sprays into both nostrils daily as needed for allergies.  01/27/16  Yes [provider]  LEVEMIR FLEXTOUCH 100 UNIT/ML Pen 12 Units daily.  04/12/15  Yes [provider]  magnesium oxide (MAG-OX) 400 MG tablet Take 400 mg by mouth daily.   Yes [provider]  tacrolimus (PROGRAF) 1 MG capsule Take 3 mg by mouth 2 (two) times daily.   Yes [provider]  amLODipine (NORVASC) 10 MG tablet Take 1 tablet (10 mg total) by mouth daily. Patient not taking: Reported on 11/28/2016 09/16/15   Colon Branch, MD  famotidine (PEPCID) 20 MG tablet Take 1 tablet (20 mg total) by mouth 2 (two) times daily. 11/29/16 12/02/16  Kary Kos,  Ottis Stain, NP  insulin lispro (HUMALOG) 100 UNIT/ML injection While on prednisone before meals and before bed 150-200: 3 units, 201-250: 5 units; 251-300: take 7 units. 301-350: take 9 units. > 350: call doctor. 11/29/16   Erick Colace, NP  predniSONE (DELTASONE) 10 MG tablet Take 4 tabs x1d, then 3 tabs x1d, then 2 tabs x1d then 1 tab x1d 11/30/16   Erick Colace, NP    Family History Family History  Problem Relation Age of Onset  . Prostate cancer Brother        at age 42s  . Heart attack Brother        at age 32  . Lung cancer Father   . Diabetes Other        father side   . Colon cancer  Neg Hx     Social History Social History  Substance Use Topics  . Smoking status: Former Research scientist (life sciences)  . Smokeless tobacco: Never Used     Comment: remote smoker  . Alcohol use No     Allergies   Benazepril hcl; Erythromycin ethylsuccinate; and Keflex [cephalexin]   Review of Systems Review of Systems  Unable to perform ROS: Acuity of condition (Unable to talk due to swollen tongue)     Physical Exam Updated Vital Signs BP 120/71 (BP Location: Left Arm)   Pulse 78   Temp 98.6 F (37 C) (Oral)   Resp 12   Ht 5\' 8"  (1.727 m)   Wt 75.2 kg (165 lb 12.6 oz)   SpO2 96%   BMI 25.21 kg/m   Physical Exam  Constitutional: He appears well-developed. He appears ill.  HENT:  Head: Normocephalic and atraumatic.  Facial and submandibular swelling L>R. Prominently edematous tongue. Difficulty tolerating secretions.   Eyes: EOM are normal.  Neck: Neck supple.  Cardiovascular: Normal rate and regular rhythm.   No murmur heard. Pulmonary/Chest: Effort normal and breath sounds normal. No respiratory distress.  Abdominal: Soft. There is no tenderness.  Musculoskeletal: He exhibits no edema.  Neurological: He is alert. No cranial nerve deficit. Coordination normal.  Moves all four extremities  Skin: Skin is warm and dry.  Nursing note and vitals reviewed.    ED Treatments / Results  Labs (all labs ordered are listed, but only abnormal results are displayed) Labs Reviewed  COMPREHENSIVE METABOLIC PANEL - Abnormal; Notable for the following:       Result Value   Glucose, Bld 159 (*)    All other components within normal limits  CBC WITH DIFFERENTIAL/PLATELET - Abnormal; Notable for the following:    WBC 11.3 (*)    Hemoglobin 12.5 (*)    HCT 37.1 (*)    Neutro Abs 10.3 (*)    All other components within normal limits  GLUCOSE, CAPILLARY - Abnormal; Notable for the following:    Glucose-Capillary 206 (*)    All other components within normal limits  GLUCOSE, CAPILLARY -  Abnormal; Notable for the following:    Glucose-Capillary 152 (*)    All other components within normal limits  GLUCOSE, CAPILLARY - Abnormal; Notable for the following:    Glucose-Capillary 179 (*)    All other components within normal limits  GLUCOSE, CAPILLARY - Abnormal; Notable for the following:    Glucose-Capillary 168 (*)    All other components within normal limits  GLUCOSE, CAPILLARY - Abnormal; Notable for the following:    Glucose-Capillary 252 (*)    All other components within normal limits  MRSA PCR SCREENING  TYPE AND SCREEN  PREPARE FRESH FROZEN PLASMA  ABO/RH    EKG  EKG Interpretation None       Radiology Dg Chest Port 1 View  Result Date: 11/28/2016 CLINICAL DATA:  Weakness and shortness of breath today. Angioedema. History of sarcoid and partial right pneumonectomy. EXAM: PORTABLE CHEST 1 VIEW COMPARISON:  10/24/2007 FINDINGS: Cardiomediastinal silhouette is unchanged. Right lung postoperative changes again identified. Chronic interstitial opacities bilaterally are again identified. No pleural effusion, definite airspace disease, mass or pneumothorax noted. No acute bony abnormalities are present. IMPRESSION: No evidence of acute abnormality. Little significant change from 2009 with chronic pulmonary changes. Electronically Signed   By: Margarette Canada M.D.   On: 11/28/2016 22:56    Procedures Procedures (including critical care time)  Medications Ordered in ED Medications  EPINEPHrine (EPI-PEN) 0.3 mg/0.3 mL injection (  Not Given 11/28/16 2014)  0.9 %  sodium chloride infusion ( Intravenous Stopped 11/29/16 0900)  famotidine (PEPCID) IVPB 20 mg premix (0 mg Intravenous Stopped 11/29/16 0639)  methylPREDNISolone sodium succinate (SOLU-MEDROL) 40 mg/mL injection 40 mg (40 mg Intravenous Given 11/29/16 1212)  heparin injection 5,000 Units (5,000 Units Subcutaneous Given 11/29/16 0610)  insulin aspart (novoLOG) injection 0-15 Units (3 Units Subcutaneous Given  11/29/16 0954)  MEDLINE mouth rinse (15 mLs Mouth Rinse Not Given 11/29/16 1000)  morphine 2 MG/ML injection 2-4 mg (2 mg Intravenous Given 11/28/16 2253)  methylPREDNISolone sodium succinate (SOLU-MEDROL) 125 mg/2 mL injection 125 mg (125 mg Intravenous Given 11/28/16 1722)  diphenhydrAMINE (BENADRYL) injection 50 mg (50 mg Intravenous Given 11/28/16 1722)  famotidine (PEPCID) IVPB 20 mg premix (0 mg Intravenous Stopped 11/28/16 1815)  white petrolatum (VASELINE) gel (  Given 11/28/16 2233)     Initial Impression / Assessment and Plan / ED Course  I have reviewed the triage vital signs and the nursing notes.  Pertinent labs & imaging results that were available during my care of the patient were reviewed by me and considered in my medical decision making (see chart for details).     61 year old male history of diabetes, HTN on benazepril, HLD, HCC who presents with wife for acute onset angioedema patient is accompanied by wife today.  He had acute progression of severe tongue and neck swelling over the past hour. He has difficulty managing secretions.  He was discharged earlier today from [place] after undergoing excision of L submandibular salivary stone. He was started on augmentin. He has never had angioedema or tongue swelling before. Currently is on benazepril.   AF, VSS. Tongue profoundly swollen. L>R neck and submandibular swelling after recent procedure. Lungs CTAB.   Concern for airway given rapid progressive swelling. Epinephrine given. ENT, anesthesiology, and critical care consulted and evaluated pt at bedside. Solumedrol, benadryl, and pepcid given. FFP ordered  Pt admitted to ICU for further management and evaluation. Pt stable at time of transfer.  Pt care d/w Dr. Tyrone Nine  Final Clinical Impressions(s) / ED Diagnoses   Final diagnoses:  Angioedema  Angioedema, initial encounter    New Prescriptions Discharge Medication List as of 11/29/2016 12:50 PM    START taking these  medications   Details  famotidine (PEPCID) 20 MG tablet Take 1 tablet (20 mg total) by mouth 2 (two) times daily., Starting Wed 11/29/2016, Until Sat 12/02/2016, OTC    predniSONE (DELTASONE) 10 MG tablet Take 4 tabs x1d, then 3 tabs x1d, then 2 tabs x1d then 1 tab x1d, Normal         Dalya Maselli, Pilar Plate, MD  11/29/16 [place], [place], DO 11/29/16 1737

## 2016-11-28 NOTE — H&P (Signed)
Name: Jeffery York MRN: [id number] DOB: [birth year]    ADMISSION DATE:  11/28/2016 CONSULTATION DATE:  11/28/2016  REFERRING MD :  Dr. Tyrone Nine EDP  CHIEF COMPLAINT:  Tongue swelling  HISTORY OF PRESENT ILLNESS:  61 year old male with PMH as below, which is significant for BPH, hepatocellular carcinoma now s/p transplant, DM, sarcoidosis, HTN. He was in his usual state of health until 6/26 when he electively had a Left submandibular stone removed at [facility]. After being discharged he returned home and took his Benazepril at around 1500. He was also given amoxicillin post-op. Ever since the surgery he has had issues swallowing, but at around 1530 he developed rapidly progressive tongue and mouth swelling. There was concern that he may need to be intubated so PCCM asked to see. He was evaluated by anesthesia and ENT at bedside who agree that he does not need intubation at this time, but if things worsen he may need to go to OR for airway placement.    SIGNIFICANT EVENTS  6/26 L submandibular stone removal, then angioedema after taking ACE.  STUDIES:    PAST MEDICAL HISTORY :   has a past medical history of Benign prostatic hypertrophy; Cirrhosis ([place]); Diabetes mellitus; [place] (hepatocellular carcinoma) ([place]); Hep C w/o coma, chronic ([place]); History of sarcoidosis; Hyperlipidemia; Hypertension; Osteoporosis; and Uveitis.  has a past surgical history that includes lung resection aspergilloma (2000) and Liver transplant (04-2015). Prior to Admission medications   Medication Sig Start Date End Date Taking? Authorizing Provider  amLODipine (NORVASC) 10 MG tablet Take 1 tablet (10 mg total) by mouth daily. 09/16/15   Colon Branch, MD  aspirin 81 MG tablet Take 81 mg by mouth daily.      [provider]  B-D ULTRAFINE III SHORT PEN 31G X 8 MM MISC Reported on 09/16/2015 05/03/15   [provider]  benazepril (LOTENSIN) 20 MG tablet Take 1 tablet (20 mg total) by mouth daily. 01/31/16    Colon Branch, MD  Blood Glucose Monitoring Suppl (FREESTYLE LITE) DEVI Reported on 09/16/2015 04/12/15   [provider]  cetirizine (ZYRTEC) 10 MG tablet Take 10 mg by mouth daily.    [provider]  colchicine 0.6 MG tablet 1 tab po bid Patient not taking: Reported on 05/17/2016 02/29/16   Saguier, Percell Miller, PA-C  fluticasone [facility]) 50 MCG/ACT nasal spray Place 2 sprays into both nostrils daily. 01/27/16   [provider]  glucose blood (ONE TOUCH TEST STRIPS) test strip Use as instructed. Check blood sugar twice daily. Patient not taking: Reported on 05/17/2016 01/20/13   Colon Branch, MD  Lancets (FREESTYLE) lancets Reported on 09/16/2015 04/12/15   [provider]  LEVEMIR FLEXTOUCH 100 UNIT/ML Pen 20 Units daily.  04/12/15   [provider]  magnesium oxide (MAG-OX) 400 MG tablet Take 400 mg by mouth daily.    [provider]  sulfamethoxazole-trimethoprim (BACTRIM,SEPTRA) 400-80 MG tablet 1 tablet by mouth on M, W, F 04/12/15   [provider]  tacrolimus (PROGRAF) 5 MG capsule Take 5 mg by mouth 2 (two) times daily.    [provider]   Allergies  Allergen Reactions  . Erythromycin Ethylsuccinate     REACTION: GI Intolerance/vomiting  . Keflex [Cephalexin] Diarrhea    With cramps and Nausea    FAMILY HISTORY:  family history includes Diabetes in his other; Heart attack in his brother; Lung cancer in his father; Prostate cancer in his brother. SOCIAL HISTORY:  reports that he has quit smoking. He has never used smokeless tobacco. He reports that he does not drink alcohol or use drugs.  REVIEW OF SYSTEMS:    Bolds are positive  Constitutional: weight loss, gain, night sweats, Fevers, chills, fatigue .  HEENT: headaches, Sore throat, sneezing, nasal congestion, post nasal drip, Difficulty swallowing, Tooth/dental problems, visual complaints visual changes, ear ache CV:  chest pain, radiates:,Orthopnea, PND, swelling in  lower extremities, dizziness, palpitations, syncope.  GI  heartburn, indigestion, abdominal pain, nausea, vomiting, diarrhea, change in bowel habits, loss of appetite, bloody stools.  Resp: cough, productive:, hemoptysis, dyspnea, chest pain, pleuritic.  Skin: rash or itching or icterus GU: dysuria, change in color of urine, urgency or frequency. flank pain, hematuria  MS: joint pain or swelling. decreased range of motion  Psych: change in mood or affect. depression or anxiety.  Neuro: difficulty with speech, weakness, numbness, ataxia    SUBJECTIVE:   VITAL SIGNS: Pulse Rate:  [78] 78 (06/26 1706) Resp:  [18] 18 (06/26 1706) BP: (131)/(64) 131/64 (06/26 1706) SpO2:  [96 %] 96 % (06/26 1706)  PHYSICAL EXAMINATION: General:  Adult male in NAD Neuro:  Alert, oriented, non-focal HEENT:  Impressive tongue and mouth swelling. No stridor. Able to phonate easily. Cardiovascular: RRR, no MRG Lungs:  Clear bilateral breath sounds Abdomen:  Soft, non-tender, non-distended. Remote surgical scar. Musculoskeletal:  No acute deformity. Skin:  Grossly intact.  No results for input(s): NA, K, CL, CO2, BUN, CREATININE, GLUCOSE in the last 168 hours. No results for input(s): HGB, HCT, WBC, PLT in the last 168 hours. No results found.  ASSESSMENT / PLAN:  Facial edema: ddx includes angioedema from ACE and anaphylaxis from amoxicillin. Surgical complication also considered. He has not had any dyspnea with this. He did have some dysphagia, but this is not improvement.  - Anesthesia and ENT have seen and will be available should his condition worsen - Hold of intubation for now and he is protecting airway and feels like he is improving - Solumedrol 60mg  q 8 hours - Pepcid 20 mg BID - IV benadryl 50mg  q 6 hours.  - FFP given in ED  S/p Liver transplant - Hold PO med ProGraf until able to take PO - Steroids as above  DM - CBG monitoring and SSI - Hold levemir while NPO  HTN - D/c ACE -  Holding amlodipine while NPO - If BP > 170 may need PRN  S/p facial surgery 6/26 - Will change ABX from amoxicillin with concern for anaphylaxis  Georgann Housekeeper, AGACNP-BC [facility] Pulmonology/Critical Care Pager [phone] or [phone]  11/28/2016 6:12 PM

## 2016-11-28 NOTE — ED Triage Notes (Signed)
Pt reports having a salivary stone removed at the [place], was discharged around 12. Pt and wife report that around 1530 pt began having severe swelling of tongue and mouth. Pt received a mouth wash and percocet at the [place]. Pt able to communicate by writing, but unable to speak due to severe swelling. Airway intact. Pt a/ox4.

## 2016-11-28 NOTE — ED Notes (Signed)
Critical care at bedside  

## 2016-11-28 NOTE — ED Notes (Addendum)
Anesthesiology at bedside. Plan to monitor very closely and left direct number in case of change.

## 2016-11-28 NOTE — ED Notes (Signed)
Pt states he is able to swallow at this time and voice is clearer than on arrival.

## 2016-11-28 NOTE — Progress Notes (Signed)
eLink Physician-Brief Progress Note Patient Name: Jeffery York DOB: June 07, 1955 MRN: [id number]   Date of Service  11/28/2016  HPI/Events of Note    eICU Interventions  Morphine for pain     Intervention Category Intermediate Interventions: Pain - evaluation and management  BYRUM,ROBERT S. 11/28/2016, 10:45 PM

## 2016-11-28 NOTE — Consult Note (Signed)
Jeffery York, Jeffery York 62 y.o., male [id number]     Chief Complaint: mouth and throat swelling  HPI: 60 yo bm underwent LEFT submandibular stone removal under anesthesia at the [facility] this AM.  Home around noon.  Took his daily Benazepril at 1500.  Had some swallowing difficulty ever since surgery.  Starting around 1530, had onset tongue and mouth swelling, rapidly progressive.  No prior similar angioedema episodes.  No breathing difficulty. Voice clear but speaking difficult.  Feels phlegm in throat and unable to swallow it sufficiently.  ENT called for evaluation and assistance with airway.    Pt has had a liver transplant.  Also Type II DM.    PMH: Past Medical History:  Diagnosis Date  . Benign prostatic hypertrophy   . Cirrhosis ([place])   . Diabetes mellitus    initially 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 but then the diabetes resurface without the use of steroids  . [place] (hepatocellular carcinoma) ([place])    s/p TACE and microwave ablation  . Hep C w/o coma, chronic (HCC)    bx aprox 12-10, s/p Harvoni, on a transplant list candidate   . History of sarcoidosis    used steroids x 10 years  . Hyperlipidemia   . Hypertension   . 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 Uveitis    h/o    Surg Hx: Past Surgical History:  Procedure Laterality Date  . LIVER TRANSPLANT  04-2015  . lung resection aspergilloma  2000    FHx:   Family History  Problem Relation Age of Onset  . Prostate cancer Brother        at age 36s  . Heart attack Brother        at age 50  . Lung cancer Father   . Diabetes Other        father side   . Colon cancer Neg Hx    SocHx:  reports that he has quit smoking. He has never used smokeless tobacco. He reports that he does not drink alcohol or use drugs.  ALLERGIES:  Allergies  Allergen Reactions  . Erythromycin Ethylsuccinate     REACTION: GI Intolerance/vomiting  . Keflex [Cephalexin] Diarrhea    With cramps and Nausea     (Not in a hospital admission)  Results for  orders placed or performed during the hospital encounter of 11/28/16 (from the past 48 hour(s))  Prepare fresh frozen plasma     Status: None (Preliminary result)   Collection Time: 11/28/16  5:13 PM  Result Value Ref Range   Unit Number [id number]    Blood Component Type THAWED PLASMA    Unit division 00    Status of Unit ISSUED    Unit tag comment VERBAL ORDERS PER DR FLOYD    Transfusion Status OK TO TRANSFUSE    Unit Number [id number]    Blood Component Type THAWED PLASMA    Unit division 00    Status of Unit ISSUED    Unit tag comment VERBAL ORDERS PER DR FLOYD    Transfusion Status OK TO TRANSFUSE    No results found.   Blood pressure 131/64, pulse 78, resp. rate 18, SpO2 96 %.  PHYSICAL EXAM: Overall appearance:  Swelling of tongue and lips.  Ecchymotic discoloration of tongue c/w surgery this AM.  Submental and submandibular soft swelling.   Head:  NCAT Ears:  Not examined  Nose: not examined Oral Cavity:  Teeth OK with limited visualization.   Oral Pharynx/Hypopharynx/Larynx:  Could  not see. Voice phonatory, no stridor. resps unlabored and sats good. Neuro:  Grossly intact Neck:  As above.  Studies Reviewed:  none    Assessment/Plan Angioedema with satisfactory airway at present.  Possible triggered by intubation, anesthesia, oral surgery this AM.    Plan: discussed with Critical Care Medicine, Anesthesia.  Will try medical management for now, consider intubation if not rapidly improving.  Will need to come off ACE inhibitors.    Jodi Marble 4/53/6468, 5:43 PM

## 2016-11-28 NOTE — ED Notes (Signed)
Pt provided with suction to maintain secretions.

## 2016-11-28 NOTE — ED Notes (Signed)
Dr Erik Obey at bedside

## 2016-11-29 DIAGNOSIS — T783XXA Angioneurotic edema, initial encounter: Secondary | ICD-10-CM | POA: Diagnosis not present

## 2016-11-29 LAB — BPAM FFP
Blood Product Expiration Date: 201806272359
Blood Product Expiration Date: 201806272359
ISSUE DATE / TIME: 201806261715
ISSUE DATE / TIME: 201806261715
UNIT TYPE AND RH: 8400
Unit Type and Rh: 2800

## 2016-11-29 LAB — COMPREHENSIVE METABOLIC PANEL
ALT: 20 U/L (ref 17–63)
AST: 24 U/L (ref 15–41)
Albumin: 3.6 g/dL (ref 3.5–5.0)
Alkaline Phosphatase: 66 U/L (ref 38–126)
Anion gap: 8 (ref 5–15)
BUN: 19 mg/dL (ref 6–20)
CHLORIDE: 104 mmol/L (ref 101–111)
CO2: 24 mmol/L (ref 22–32)
CREATININE: 1.11 mg/dL (ref 0.61–1.24)
Calcium: 9 mg/dL (ref 8.9–10.3)
GFR calc non Af Amer: >60 mL/min (ref >60)
Glucose, Bld: 159 mg/dL — ABNORMAL HIGH (ref 65–99)
POTASSIUM: 4.7 mmol/L (ref 3.5–5.1)
SODIUM: 136 mmol/L (ref 135–145)
Total Bilirubin: 1 mg/dL (ref 0.3–1.2)
Total Protein: 7.7 g/dL (ref 6.5–8.1)

## 2016-11-29 LAB — PREPARE FRESH FROZEN PLASMA
Unit division: 0
Unit division: 0

## 2016-11-29 LAB — GLUCOSE, CAPILLARY
GLUCOSE-CAPILLARY: 168 mg/dL — AB (ref 65–99)
Glucose-Capillary: 152 mg/dL — ABNORMAL HIGH (ref 65–99)
Glucose-Capillary: 179 mg/dL — ABNORMAL HIGH (ref 65–99)
Glucose-Capillary: 252 mg/dL — ABNORMAL HIGH (ref 65–99)

## 2016-11-29 MED ORDER — FAMOTIDINE 20 MG PO TABS: 20.0000 mg | ORAL_TABLET | Freq: Two times a day (BID) | ORAL | Status: DC

## 2016-11-29 MED ORDER — INSULIN LISPRO 100 UNIT/ML ~~LOC~~ SOLN: SUBCUTANEOUS | 11 refills | Status: DC

## 2016-11-29 MED ORDER — PREDNISONE 10 MG PO TABS: ORAL_TABLET | ORAL | 0 refills | Status: DC

## 2016-11-29 MED ORDER — INSULIN ASPART 100 UNIT/ML ~~LOC~~ SOLN: SUBCUTANEOUS | 11 refills | Status: DC

## 2016-11-29 NOTE — Discharge Instructions (Signed)
Angioedema  Angioedema is sudden swelling in the body. The swelling can happen in any part of the body. It often happens on the skin and causes itchy, bumpy patches (hives) to form.  This condition may:  · Happen only one time.  · Happen more than one time. It may come back at random times.  · Keep coming back for a number of years. Someday it may stop coming back.    Follow these instructions at home:  · Take over-the-counter and prescription medicines only as told by your doctor.  · If you were given medicines for emergency allergy treatment, always carry them with you.  · Wear a medical bracelet as told by your doctor.  · Avoid the things that cause your attacks (triggers).  · If this condition was passed to you from your parents and you want to have kids, talk to your doctor. Your kids may also have this condition.  Contact a doctor if:  · You have another attack.  · Your attacks happen more often, even after you take steps to prevent them.  · This condition was passed to you by your parents and you want to have kids.  Get help right away if:  · Your mouth, tongue, or lips get very swollen.  · You have trouble breathing.  · You have trouble swallowing.  · You pass out (faint).  This information is not intended to replace advice given to you by your health care provider. Make sure you discuss any questions you have with your health care provider.  Document Released: 05/10/2009 Document Revised: 12/22/2015 Document Reviewed: 11/30/2015  Elsevier Interactive Patient Education © 2017 [employer]

## 2016-11-29 NOTE — Progress Notes (Signed)
11/29/2016 9:16 AM  Milinda Antis [id number]  Hosp Day 1    Temp:  [97.6 F (36.4 C)-99.1 F (37.3 C)] 99.1 F (37.3 C) (06/27 0803) Pulse Rate:  [65-87] 72 (06/27 0800) Resp:  [8-20] 12 (06/27 0800) BP: (110-146)/(57-80) 121/72 (06/27 0800) SpO2:  [89 %-100 %] 95 % (06/27 0800) Weight:  [75.2 kg (165 lb 12.6 oz)] 75.2 kg (165 lb 12.6 oz) (06/26 1950),     Intake/Output Summary (Last 24 hours) at 11/29/16 0916 Last data filed at 11/29/16 0900  Gross per 24 hour  Intake          1750.05 ml  Output              800 ml  Net           950.05 ml    Results for orders placed or performed during the hospital encounter of 11/28/16 (from the past 24 hour(s))  Prepare fresh frozen plasma     Status: None   Collection Time: 11/28/16  5:13 PM  Result Value Ref Range   Unit Number [id number]    Blood Component Type THAWED PLASMA    Unit division 00    Status of Unit ISSUED,FINAL    Unit tag comment VERBAL ORDERS PER DR FLOYD    Transfusion Status OK TO TRANSFUSE    Unit Number [id number]    Blood Component Type THAWED PLASMA    Unit division 00    Status of Unit ISSUED,FINAL    Unit tag comment VERBAL ORDERS PER DR FLOYD    Transfusion Status OK TO TRANSFUSE   Type and screen     Status: None   Collection Time: 11/28/16  5:32 PM  Result Value Ref Range   ABO/RH(D) A POS    Antibody Screen NEG    Sample Expiration 12/01/2016   ABO/Rh     Status: None   Collection Time: 11/28/16  5:32 PM  Result Value Ref Range   ABO/RH(D) A POS   MRSA PCR Screening     Status: None   Collection Time: 11/28/16  7:59 PM  Result Value Ref Range   MRSA by PCR NEGATIVE NEGATIVE  Glucose, capillary     Status: Abnormal   Collection Time: 11/28/16  8:19 PM  Result Value Ref Range   Glucose-Capillary 206 (H) 65 - 99 mg/dL   Comment 1 Capillary Specimen   CBC with Differential/Platelet     Status: Abnormal   Collection Time: 11/28/16  8:33 PM  Result Value Ref Range   WBC 11.3 (H) 4.0 -  10.5 K/uL   RBC 4.29 4.22 - 5.81 MIL/uL   Hemoglobin 12.5 (L) 13.0 - 17.0 g/dL   HCT 37.1 (L) 39.0 - 52.0 %   MCV 86.5 78.0 - 100.0 fL   MCH 29.1 26.0 - 34.0 pg   MCHC 33.7 30.0 - 36.0 g/dL   RDW 13.6 11.5 - 15.5 %   Platelets 172 150 - 400 K/uL   Neutrophils Relative % 92 %   Neutro Abs 10.3 (H) 1.7 - 7.7 K/uL   Lymphocytes Relative 7 %   Lymphs Abs 0.8 0.7 - 4.0 K/uL   Monocytes Relative 1 %   Monocytes Absolute 0.2 0.1 - 1.0 K/uL   Eosinophils Relative 0 %   Eosinophils Absolute 0.0 0.0 - 0.7 K/uL   Basophils Relative 0 %   Basophils Absolute 0.0 0.0 - 0.1 K/uL  Glucose, capillary     Status: Abnormal   Collection Time:  11/29/16 12:18 AM  Result Value Ref Range   Glucose-Capillary 152 (H) 65 - 99 mg/dL   Comment 1 Notify RN   Comprehensive metabolic panel     Status: Abnormal   Collection Time: 11/29/16  2:10 AM  Result Value Ref Range   Sodium 136 135 - 145 mmol/L   Potassium 4.7 3.5 - 5.1 mmol/L   Chloride 104 101 - 111 mmol/L   CO2 24 22 - 32 mmol/L   Glucose, Bld 159 (H) 65 - 99 mg/dL   BUN 19 6 - 20 mg/dL   Creatinine, Ser 1.11 0.61 - 1.24 mg/dL   Calcium 9.0 8.9 - 10.3 mg/dL   Total Protein 7.7 6.5 - 8.1 g/dL   Albumin 3.6 3.5 - 5.0 g/dL   AST 24 15 - 41 U/L   ALT 20 17 - 63 U/L   Alkaline Phosphatase 66 38 - 126 U/L   Total Bilirubin 1.0 0.3 - 1.2 mg/dL   GFR calc non Af Amer >60 >60 mL/min   GFR calc Af Amer >60 >60 mL/min   Anion gap 8 5 - 15  Glucose, capillary     Status: Abnormal   Collection Time: 11/29/16  4:38 AM  Result Value Ref Range   Glucose-Capillary 179 (H) 65 - 99 mg/dL   Comment 1 Document in Chart   Glucose, capillary     Status: Abnormal   Collection Time: 11/29/16  7:58 AM  Result Value Ref Range   Glucose-Capillary 168 (H) 65 - 99 mg/dL   Comment 1 Capillary Specimen     SUBJECTIVE:  Much less swollen . No breathing issues. Now swallowing.    OBJECTIVE:  Sl tongue and neck swelling c/w surgery yesterday AM.  Voice clear.  Mouth  and throat look good.  IMPRESSION:  Resolving angioedema  PLAN:  Advance diet.  OK for discharge home from my standpoint.  OFF ACE inhibitors.  F/u [facility] doctors Friday as scheduled.  Recheck me as needed.  Thanks.  Jodi Marble

## 2016-11-29 NOTE — Care Management Obs Status (Signed)
[place] NOTIFICATION   Patient Details  Name: Jeffery York MRN: [id number] Date of Birth: 1955/11/02   Medicare Observation Status Notification Given:  Yes    Maryclare Labrador, RN 11/29/2016, 2:31 PM

## 2016-11-29 NOTE — Discharge Summary (Signed)
Physician Discharge Summary       Patient ID: Jeffery York MRN: [id number] DOB/AGE: 1956/05/08 61 y.o.  Admit date: 11/28/2016 Discharge date: 11/29/2016  Discharge Diagnoses:  Angioedema (d/t ace-inhibitor) Diabetes w/ hyperglycemia  Detailed Hospital Course:   61 year old male with PMH as below, which is significant for BPH, hepatocellular carcinoma now s/p transplant, DM, sarcoidosis, HTN. He was in his usual state of health until 6/26 when he electively had a Left submandibular stone removed at [facility]. After being discharged he returned home and took his Benazepril at around 1500. He was also given amoxicillin post-op. Ever since the surgery he has had issues swallowing, but at around 1530 he developed rapidly progressive tongue and mouth swelling. There was concern that he may need to be intubated so PCCM asked to see. He was evaluated by anesthesia and ENT at bedside who agree that he does not need intubation at this time, but if things worsen he may need to go to OR for airway placement.   He was admitted to the intensive care. Therapeutic interventions included: IV hydration, IV systemic steroids, IV H2 blockade and stopping his ace-I. We also had ENT see him. He continued to make gradual and steady improvement. He was seen by ENT and the CCM rounding team the morning of 6/22 and was cleared for dc.    Discharge Plan by active problems  Angioedema  -better Plan Rapid taper pred 3 d oral pepid No more ace-I.  F/u arranged in [place].   DM w/ hyperglycemia Plan Adjusted home insulin while on pred   Significant Hospital tests/ studies  Consults ENT   Discharge Exam: BP 120/71 (BP Location: Left Arm)   Pulse 78   Temp 98.6 F (37 C) (Oral)   Resp 12   Ht 5\' 8"  (1.727 m)   Wt 165 lb 12.6 oz (75.2 kg)   SpO2 96%   BMI 25.21 kg/m   Physical Exam  Constitutional: He is oriented to person, place, and time. He appears well-developed and well-nourished.  HENT:  Head:  Normocephalic and atraumatic.  Eyes: Conjunctivae are normal. Pupils are equal, round, and reactive to light.  Neck: Normal range of motion. Neck supple. No JVD present.  Cardiovascular: Normal rate, regular rhythm and normal heart sounds.   Pulmonary/Chest: Effort normal and breath sounds normal. No respiratory distress.  Abdominal: Soft. Bowel sounds are normal.  Musculoskeletal: Normal range of motion. He exhibits no edema.  Neurological: He is alert and oriented to person, place, and time.  Skin: Skin is warm and dry.    Labs at discharge Lab Results  Component Value Date   CREATININE 1.11 11/29/2016   BUN 19 11/29/2016   NA 136 11/29/2016   K 4.7 11/29/2016   CL 104 11/29/2016   CO2 24 11/29/2016   Lab Results  Component Value Date   WBC 11.3 (H) 11/28/2016   HGB 12.5 (L) 11/28/2016   HCT 37.1 (L) 11/28/2016   MCV 86.5 11/28/2016   PLT 172 11/28/2016   Lab Results  Component Value Date   ALT 20 11/29/2016   AST 24 11/29/2016   ALKPHOS 66 11/29/2016   BILITOT 1.0 11/29/2016   Lab Results  Component Value Date   INR 0.99 06/14/2009   INR 1.0 SLIGHT HEMOLYSIS 10/24/2007    Current radiology studies Dg Chest Port 1 View  Result Date: 11/28/2016 CLINICAL DATA:  Weakness and shortness of breath today. Angioedema. History of sarcoid and partial right pneumonectomy. EXAM:  PORTABLE CHEST 1 VIEW COMPARISON:  10/24/2007 FINDINGS: Cardiomediastinal silhouette is unchanged. Right lung postoperative changes again identified. Chronic interstitial opacities bilaterally are again identified. No pleural effusion, definite airspace disease, mass or pneumothorax noted. No acute bony abnormalities are present. IMPRESSION: No evidence of acute abnormality. Little significant change from 2009 with chronic pulmonary changes. Electronically Signed   By: Margarette Canada M.D.   On: 11/28/2016 22:56    Disposition:  Final discharge disposition not confirmed  Discharge Instructions    Diet -  low sodium heart healthy    Complete by:  As directed    Increase activity slowly    Complete by:  As directed      Allergies as of 11/29/2016      Reactions   Benazepril Hcl Swelling   angioedema   Erythromycin Ethylsuccinate    REACTION: GI Intolerance/vomiting   Keflex [cephalexin] Diarrhea   With cramps and Nausea      Medication List    STOP taking these medications   amLODipine-benazepril 10-20 MG capsule Commonly known as:  LOTREL     TAKE these medications   amLODipine 10 MG tablet Commonly known as:  NORVASC Take 1 tablet (10 mg total) by mouth daily.   ascorbic acid 500 MG tablet Commonly known as:  VITAMIN C Take 500 mg by mouth daily.   aspirin 81 MG tablet Take 81 mg by mouth daily.   cetirizine 10 MG tablet Commonly known as:  ZYRTEC Take 10 mg by mouth daily.   cholecalciferol 1000 units tablet Commonly known as:  VITAMIN D Take 1,500 Units by mouth daily.   cyanocobalamin 1000 MCG tablet Take 1,500 mcg by mouth daily.   famotidine 20 MG tablet Commonly known as:  PEPCID Take 1 tablet (20 mg total) by mouth 2 (two) times daily.   fluticasone 50 MCG/ACT nasal spray Commonly known as:  FLONASE Place 2 sprays into both nostrils daily as needed for allergies.   insulin lispro 100 UNIT/ML injection Commonly known as:  HUMALOG While on prednisone before meals and before bed 150-200: 3 units, 201-250: 5 units; 251-300: take 7 units. 301-350: take 9 units. > 350: call doctor. What changed:  how much to take  how to take this  when to take this  additional instructions   LEVEMIR FLEXTOUCH 100 UNIT/ML Pen Generic drug:  Insulin Detemir 12 Units daily.   magnesium oxide 400 MG tablet Commonly known as:  MAG-OX Take 400 mg by mouth daily.   predniSONE 10 MG tablet Commonly known as:  DELTASONE Take 4 tabs x1d, then 3 tabs x1d, then 2 tabs x1d then 1 tab x1d Start taking on:  11/30/2016   tacrolimus 1 MG capsule Commonly known as:   PROGRAF Take 3 mg by mouth 2 (two) times daily.        Discharged Condition: good  Physician Statement:   The Patient was personally examined, the discharge assessment and plan has been personally reviewed and I agree with ACNP Babcock's assessment and plan. 32 minutes of time have been dedicated to discharge assessment, planning and discharge instructions.   Signed: Clementeen Graham 11/29/2016, 12:29 PM

## 2016-11-29 NOTE — Care Management CC44 (Signed)
Condition Code 44 Documentation Completed  Patient Details  Name: Jeffery York MRN: [id number] Date of Birth: [birth year]   Condition Code 44 given:  Yes Patient signature on Condition Code 44 notice:  Yes Documentation of 2 MD's agreement:  Yes Code 44 added to claim:  Yes    Maryclare Labrador, RN 11/29/2016, 2:32 PM

## 2016-11-29 NOTE — Care Management Note (Addendum)
Case Management Note  Patient Details  Name: Jeffery York MRN: [id number] Date of Birth: 03-Oct-1955  Subjective/Objective:    Pt admitted with angiodema post taking an ACE inhibitor            Action/Plan:   PTA independent from home with spouse.   Pt has active insurance and denies barriers to obtaining medications as prescribed.     Expected Discharge Date:                  Expected Discharge Plan:  Home/Self Care  In-House Referral:     Discharge planning Services  CM Consult  Post Acute Care Choice:    Choice offered to:     DME Arranged:    DME Agency:     HH Arranged:    HH Agency:     Status of Service:     If discussed at [employer] of Stay Meetings, dates discussed:    Additional Comments:  Maryclare Labrador, RN 11/29/2016, 11:27 AM

## 2016-11-29 NOTE — Care Management Obs Status (Signed)
[place] NOTIFICATION   Patient Details  Name: Jeffery York MRN: [id number] Date of Birth: 03-Jun-1956   Medicare Observation Status Notification Given:  Yes    Maryclare Labrador, RN 11/29/2016, 1:09 PM

## 2016-11-29 NOTE — Progress Notes (Signed)
Pt ambulated around unit x's 4. 02 sats maintained 92% and greater. HR maintained in 90's. Denied any complaints while ambulating. Pt tolerated well. Pt pending discharge home today.

## 2016-11-29 NOTE — Progress Notes (Signed)
Pt discharged home with spouse. Discharge instructions reviewed with patient and spouse. Pertinent questions addressed. Patient to follow up with [facility] as previously scheduled. VSS at discharge. 2 PIV's removed. Pt wheeled to private vehicle by NT. All belongings sent with patient.

## 2016-12-01 ENCOUNTER — Telehealth: Payer: Self-pay

## 2016-12-01 NOTE — Telephone Encounter (Signed)
Per patients wife patient is at the [place] at this time and they will have to call me back.

## 2016-12-01 NOTE — Telephone Encounter (Signed)
Transition Care Management Follow-up Telephone Call  ADMISSION DATE: 11/28/16  DISCHARGE DATE: 6/27/018   How have you been since you were released from the hospital?  Wnt to Oral surgeon office today states he is feeling better now.   Do you understand why you were in the hospital? Yes    Do you understand the discharge instrcutions? Yes    Items Reviewed:  Medications reviewed: Yes   Allergies reviewed:Yes   Dietary changes reviewed: Low Sodium Heart Healthy   Referrals reviewed: yesfollow up appointment scheduled   Functional Questionnaire:   Activities of Daily Living (ADLs):  Yes per patient   Any patient concerns? None per patient   Confirmed importance and date/time of follow-up visits scheduled: Yes   Confirmed with patient if condition begins to worsen call PCP or go to the ER.  Yes    Patient was given the office number and encouragred to call back with questions or concerns.Yes

## 2016-12-01 NOTE — Telephone Encounter (Signed)
Hospital follow up attempted. Left message for patient to return call.

## 2016-12-04 ENCOUNTER — Encounter: Payer: Medicare Other | Admitting: Internal Medicine

## 2016-12-04 DIAGNOSIS — B259 Cytomegaloviral disease, unspecified: Secondary | ICD-10-CM | POA: Diagnosis not present

## 2016-12-04 DIAGNOSIS — Z4823 Encounter for aftercare following liver transplant: Secondary | ICD-10-CM | POA: Diagnosis not present

## 2016-12-04 DIAGNOSIS — Z944 Liver transplant status: Secondary | ICD-10-CM | POA: Diagnosis not present

## 2016-12-11 DIAGNOSIS — Z944 Liver transplant status: Secondary | ICD-10-CM | POA: Diagnosis not present

## 2016-12-11 DIAGNOSIS — Z4823 Encounter for aftercare following liver transplant: Secondary | ICD-10-CM | POA: Diagnosis not present

## 2016-12-11 DIAGNOSIS — B259 Cytomegaloviral disease, unspecified: Secondary | ICD-10-CM | POA: Diagnosis not present

## 2016-12-18 DIAGNOSIS — B259 Cytomegaloviral disease, unspecified: Secondary | ICD-10-CM | POA: Diagnosis not present

## 2016-12-18 DIAGNOSIS — Z4823 Encounter for aftercare following liver transplant: Secondary | ICD-10-CM | POA: Diagnosis not present

## 2016-12-18 DIAGNOSIS — Z944 Liver transplant status: Secondary | ICD-10-CM | POA: Diagnosis not present

## 2016-12-21 ENCOUNTER — Ambulatory Visit (INDEPENDENT_AMBULATORY_CARE_PROVIDER_SITE_OTHER): Payer: Medicare Other | Admitting: Internal Medicine

## 2016-12-21 ENCOUNTER — Encounter: Payer: Self-pay | Admitting: Internal Medicine

## 2016-12-21 VITALS — BP 124/74 | HR 63 | Temp 98.5°F | Ht 68.0 in | Wt 164.0 lb

## 2016-12-21 DIAGNOSIS — E119 Type 2 diabetes mellitus without complications: Secondary | ICD-10-CM

## 2016-12-21 DIAGNOSIS — Z794 Long term (current) use of insulin: Secondary | ICD-10-CM

## 2016-12-21 DIAGNOSIS — I10 Essential (primary) hypertension: Secondary | ICD-10-CM | POA: Diagnosis not present

## 2016-12-21 DIAGNOSIS — T783XXD Angioneurotic edema, subsequent encounter: Secondary | ICD-10-CM | POA: Diagnosis not present

## 2016-12-21 LAB — BASIC METABOLIC PANEL
BUN: 23 mg/dL (ref 6–23)
CHLORIDE: 102 meq/L (ref 96–112)
CO2: 29 mEq/L (ref 19–32)
CREATININE: 1 mg/dL (ref 0.40–1.50)
Calcium: 9.5 mg/dL (ref 8.4–10.5)
GFR: 97.18 mL/min (ref >60.00)
GLUCOSE: 116 mg/dL — AB (ref 70–99)
POTASSIUM: 4.4 meq/L (ref 3.5–5.1)
Sodium: 135 mEq/L (ref 135–145)

## 2016-12-21 MED ORDER — AMLODIPINE BESYLATE 10 MG PO TABS: 10.0000 mg | ORAL_TABLET | Freq: Every day | ORAL | 1 refills | Status: DC

## 2016-12-21 NOTE — Progress Notes (Signed)
Subjective:    Patient ID: Jeffery York, male    DOB: 12-01-1955, 61 y.o.   MRN: [id number]  DOS:  12/21/2016 Type of visit - description : TCM 14 Interval history: Patient was admitted to the hospital and discharged 11/29/2016. The patient had a left submandibular stone removal at the [place], received amoxicillin at the time of the procedure, he was discharged home. Once at home, he took his regular benazepril and shortly after developed rapidly progressive tongue  swelling. Was evaluated by ENT, anesthesia and eventually admitted to the intensive care for medical treatment. No intubation was needed. He had an IV steroids, H2 blockers. They recommended to stop ACE inhibitors.    Review of Systems Since he left the hospital, took the medications as prescribed- prednisone and H2 blockers. No further problems. His blood pressure is in the 130s after he stopped ACEi and and was started on HCTZ. Blood sugars are well control according to the patient, very seldom has a low number although he could not tell me any specific. Denies wheezing, difficulty breathing.  Past Medical History:  Diagnosis Date  . Benign prostatic hypertrophy   . Cirrhosis ([place])   . Diabetes mellitus    initially 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 but then the diabetes resurface without the use of steroids  . [place] (hepatocellular carcinoma) ([place])    s/p TACE and microwave ablation  . Hep C w/o coma, chronic (HCC)    bx aprox 12-10, s/p Harvoni, on a transplant list candidate   . History of sarcoidosis    used steroids x 10 years  . Hyperlipidemia   . Hypertension   . 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 Uveitis    h/o    Past Surgical History:  Procedure Laterality Date  . LIVER TRANSPLANT  04-2015  . 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 Marital status: Married    Spouse name: N/A  . Number of children: 0  . Years of education: N/A   Occupational History  . retired Nature conservation officer     Social History Main Topics  . Smoking status: Former Research scientist (life sciences)  . Smokeless tobacco: Never Used     Comment: remote smoker  . Alcohol use No  . Drug use: No  . Sexual activity: Yes   Other Topics Concern  . Not on file   Social History Narrative   Mother- Lance Bosch one of my patients, deceased ~ 01-Oct-2011   Married, No children       Allergies as of 12/21/2016      Reactions   Benazepril Hcl Swelling   angioedema   Erythromycin Ethylsuccinate    REACTION: GI Intolerance/vomiting   Keflex [cephalexin] Diarrhea   With cramps and Nausea      Medication List       Accurate as of 12/21/16  6:08 PM. Always use your most recent med list.          amLODipine 10 MG tablet Commonly known as:  NORVASC Take 1 tablet (10 mg total) by mouth daily.   ascorbic acid 500 MG tablet Commonly known as:  VITAMIN C Take 500 mg by mouth daily.   aspirin 81 MG tablet Take 81 mg by mouth daily.   cetirizine 10 MG tablet Commonly known as:  ZYRTEC Take 10 mg by mouth daily.   cholecalciferol 1000 units tablet Commonly known as:  VITAMIN D Take 1,500 Units by mouth daily.  cyanocobalamin 1000 MCG tablet Take 1,500 mcg by mouth daily.   fluticasone 50 MCG/ACT nasal spray Commonly known as:  FLONASE Place 2 sprays into both nostrils daily as needed for allergies.   hydrochlorothiazide 25 MG tablet Commonly known as:  HYDRODIURIL Take 12.5 mg by mouth daily.   insulin lispro 100 UNIT/ML injection Commonly known as:  HUMALOG While on prednisone before meals and before bed 150-200: 3 units, 201-250: 5 units; 251-300: take 7 units. 301-350: take 9 units. > 350: call doctor.   LEVEMIR FLEXTOUCH 100 UNIT/ML Pen Generic drug:  Insulin Detemir 12 Units daily.   magnesium oxide 400 MG tablet Commonly known as:  MAG-OX Take 400 mg by mouth daily.   tacrolimus 1 MG capsule Commonly known as:  PROGRAF Take 3 mg by mouth 2 (two) times daily.          Objective:   Physical  Exam BP 124/74 (BP Location: Left Arm, Patient Position: Sitting, Cuff Size: Small)   Pulse 63   Temp 98.5 F (36.9 C) (Oral)   Ht 5\' 8"  (1.727 m)   Wt 164 lb (74.4 kg)   SpO2 100%   BMI 24.94 kg/m  General:   Well developed, well nourished . NAD.  HEENT:  Normocephalic . Face symmetric, atraumatic. Lips and face symmetric. Voice normal. Lungs:  CTA B Normal respiratory effort, no intercostal retractions, no accessory muscle use. Heart: RRR,  no murmur.  No pretibial edema bilaterally  Skin: Not pale. Not jaundice Neurologic:  alert & oriented X3.  Speech normal, gait appropriate for age and unassisted Psych--  Cognition and judgment appear intact.  Cooperative with normal attention span and concentration.  Behavior appropriate. No anxious or depressed appearing.      Assessment & Plan:    Assessment > DM: per Dr Loanne Drilling -- Initially 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 then it resurfaced w/o steroids HTN Hyperlipidemia Gout BPH-- failed Flomax before ED ?Osteoporosis steroid-induced: dexa wnl 2010 H/o uveitis Sarcoidosis s/p steroids for 10 years  GI:  f/u at [place]  -- Liver transplant  04-07-2015 @ [facility]   --Hep C: S/p harvoni failure   --Cirrhosis --Hepatocellular ca, s/p TACE- microwave ablation --> failed, had chemo embolization 03-2015  --Reportedly get hepatitis A-B shots from GI   PLAN: Angioedema: Was admitted to the hospital with angioedema, currently doing well. Sx  happened shortly after he took benazepril but also amoxicillin. Problem  felt to be due to ACE inhibitor by the hospital team, I agree. He has taken amoxicillin multiple times w/o problems. Keflex listed in his allergies, patient reports that he has cramps and nausea when he takes it. Denies rash or angioedema with Keflex. HTN: ACEi d/c, was rx HCTZ and continue w/ amlodipine. Check a BMP. DM: On Levemir 13 units and Humalog prn between 3 and 7 units. Hardly ever has low CBGs, recommend to see Dr.  Loanne Drilling RTC at his convenience for a CPX.

## 2016-12-21 NOTE — Patient Instructions (Signed)
GO TO THE LAB : Get the blood work     GO TO THE FRONT DESK Schedule your next appointment for a  physical exam at your earliest convenience  Please see Dr. Loanne Drilling

## 2016-12-21 NOTE — Progress Notes (Signed)
Pre visit review using our clinic review tool, if applicable. No additional management support is needed unless otherwise documented below in the visit note. 

## 2016-12-21 NOTE — Assessment & Plan Note (Signed)
PLAN: Angioedema: Was admitted to the hospital with angioedema, currently doing well. Sx  happened shortly after he took benazepril but also amoxicillin. Problem  felt to be due to ACE inhibitor by the hospital team, I agree. He has taken amoxicillin multiple times w/o problems. Keflex listed in his allergies, patient reports that he has cramps and nausea when he takes it. Denies rash or angioedema with Keflex. HTN: ACEi d/c, was rx HCTZ and continue w/ amlodipine. Check a BMP. DM: On Levemir 13 units and Humalog prn between 3 and 7 units. Hardly ever has low CBGs, recommend to see Dr. Loanne Drilling RTC at his convenience for a CPX.

## 2016-12-25 DIAGNOSIS — B259 Cytomegaloviral disease, unspecified: Secondary | ICD-10-CM | POA: Diagnosis not present

## 2016-12-25 DIAGNOSIS — Z4823 Encounter for aftercare following liver transplant: Secondary | ICD-10-CM | POA: Diagnosis not present

## 2016-12-25 DIAGNOSIS — Z944 Liver transplant status: Secondary | ICD-10-CM | POA: Diagnosis not present

## 2017-01-01 DIAGNOSIS — Z944 Liver transplant status: Secondary | ICD-10-CM | POA: Diagnosis not present

## 2017-01-01 DIAGNOSIS — Z4823 Encounter for aftercare following liver transplant: Secondary | ICD-10-CM | POA: Diagnosis not present

## 2017-01-01 DIAGNOSIS — B259 Cytomegaloviral disease, unspecified: Secondary | ICD-10-CM | POA: Diagnosis not present

## 2017-01-08 DIAGNOSIS — B259 Cytomegaloviral disease, unspecified: Secondary | ICD-10-CM | POA: Diagnosis not present

## 2017-01-08 DIAGNOSIS — Z4823 Encounter for aftercare following liver transplant: Secondary | ICD-10-CM | POA: Diagnosis not present

## 2017-01-08 DIAGNOSIS — Z944 Liver transplant status: Secondary | ICD-10-CM | POA: Diagnosis not present

## 2017-01-15 DIAGNOSIS — B259 Cytomegaloviral disease, unspecified: Secondary | ICD-10-CM | POA: Diagnosis not present

## 2017-01-15 DIAGNOSIS — Z944 Liver transplant status: Secondary | ICD-10-CM | POA: Diagnosis not present

## 2017-01-15 DIAGNOSIS — Z4823 Encounter for aftercare following liver transplant: Secondary | ICD-10-CM | POA: Diagnosis not present

## 2017-01-22 DIAGNOSIS — Z944 Liver transplant status: Secondary | ICD-10-CM | POA: Diagnosis not present

## 2017-01-22 DIAGNOSIS — Z4823 Encounter for aftercare following liver transplant: Secondary | ICD-10-CM | POA: Diagnosis not present

## 2017-01-22 DIAGNOSIS — B259 Cytomegaloviral disease, unspecified: Secondary | ICD-10-CM | POA: Diagnosis not present

## 2017-01-29 DIAGNOSIS — Z944 Liver transplant status: Secondary | ICD-10-CM | POA: Diagnosis not present

## 2017-01-29 DIAGNOSIS — B259 Cytomegaloviral disease, unspecified: Secondary | ICD-10-CM | POA: Diagnosis not present

## 2017-01-29 DIAGNOSIS — Z4823 Encounter for aftercare following liver transplant: Secondary | ICD-10-CM | POA: Diagnosis not present

## 2017-02-06 DIAGNOSIS — Z4823 Encounter for aftercare following liver transplant: Secondary | ICD-10-CM | POA: Diagnosis not present

## 2017-02-06 DIAGNOSIS — B259 Cytomegaloviral disease, unspecified: Secondary | ICD-10-CM | POA: Diagnosis not present

## 2017-02-06 DIAGNOSIS — Z944 Liver transplant status: Secondary | ICD-10-CM | POA: Diagnosis not present

## 2017-02-12 DIAGNOSIS — Z944 Liver transplant status: Secondary | ICD-10-CM | POA: Diagnosis not present

## 2017-02-12 DIAGNOSIS — B259 Cytomegaloviral disease, unspecified: Secondary | ICD-10-CM | POA: Diagnosis not present

## 2017-02-12 DIAGNOSIS — Z4823 Encounter for aftercare following liver transplant: Secondary | ICD-10-CM | POA: Diagnosis not present

## 2017-02-19 DIAGNOSIS — Z4823 Encounter for aftercare following liver transplant: Secondary | ICD-10-CM | POA: Diagnosis not present

## 2017-02-19 DIAGNOSIS — B259 Cytomegaloviral disease, unspecified: Secondary | ICD-10-CM | POA: Diagnosis not present

## 2017-02-19 DIAGNOSIS — Z944 Liver transplant status: Secondary | ICD-10-CM | POA: Diagnosis not present

## 2017-02-26 DIAGNOSIS — B259 Cytomegaloviral disease, unspecified: Secondary | ICD-10-CM | POA: Diagnosis not present

## 2017-02-26 DIAGNOSIS — Z944 Liver transplant status: Secondary | ICD-10-CM | POA: Diagnosis not present

## 2017-02-26 DIAGNOSIS — Z4823 Encounter for aftercare following liver transplant: Secondary | ICD-10-CM | POA: Diagnosis not present

## 2017-03-02 ENCOUNTER — Other Ambulatory Visit: Payer: Self-pay | Admitting: Internal Medicine

## 2017-03-02 DIAGNOSIS — Z220 Carrier of typhoid: Secondary | ICD-10-CM

## 2017-03-02 DIAGNOSIS — R0989 Other specified symptoms and signs involving the circulatory and respiratory systems: Secondary | ICD-10-CM

## 2017-03-02 DIAGNOSIS — Z944 Liver transplant status: Secondary | ICD-10-CM

## 2017-03-08 ENCOUNTER — Ambulatory Visit
Admission: RE | Admit: 2017-03-08 | Discharge: 2017-03-08 | Disposition: A | Discharge status: Home / Self Care | Payer: Medicare Other | Source: Ambulatory Visit | Attending: Internal Medicine | Admitting: Internal Medicine

## 2017-03-08 DIAGNOSIS — Z220 Carrier of typhoid: Secondary | ICD-10-CM

## 2017-03-08 DIAGNOSIS — I700 Atherosclerosis of aorta: Secondary | ICD-10-CM | POA: Diagnosis not present

## 2017-03-08 DIAGNOSIS — R0989 Other specified symptoms and signs involving the circulatory and respiratory systems: Secondary | ICD-10-CM

## 2017-03-08 DIAGNOSIS — Z944 Liver transplant status: Secondary | ICD-10-CM | POA: Diagnosis not present

## 2017-03-08 MED ORDER — IOPAMIDOL (ISOVUE-300) INJECTION 61%
100.0000 mL | Freq: Once | INTRAVENOUS | Status: AC | PRN
  Administered 2017-03-08: 100 mL via INTRAVENOUS

## 2017-04-02 DIAGNOSIS — B259 Cytomegaloviral disease, unspecified: Secondary | ICD-10-CM | POA: Diagnosis not present

## 2017-04-02 DIAGNOSIS — Z944 Liver transplant status: Secondary | ICD-10-CM | POA: Diagnosis not present

## 2017-04-13 DIAGNOSIS — D899 Disorder involving the immune mechanism, unspecified: Secondary | ICD-10-CM | POA: Diagnosis not present

## 2017-04-13 DIAGNOSIS — C220 Liver cell carcinoma: Secondary | ICD-10-CM | POA: Diagnosis not present

## 2017-04-13 DIAGNOSIS — Z944 Liver transplant status: Secondary | ICD-10-CM | POA: Diagnosis not present

## 2017-04-30 DIAGNOSIS — B259 Cytomegaloviral disease, unspecified: Secondary | ICD-10-CM | POA: Diagnosis not present

## 2017-04-30 DIAGNOSIS — Z944 Liver transplant status: Secondary | ICD-10-CM | POA: Diagnosis not present

## 2017-05-14 ENCOUNTER — Encounter: Payer: Medicare Other | Admitting: Internal Medicine

## 2017-06-12 DIAGNOSIS — B259 Cytomegaloviral disease, unspecified: Secondary | ICD-10-CM | POA: Diagnosis not present

## 2017-06-12 DIAGNOSIS — Z944 Liver transplant status: Secondary | ICD-10-CM | POA: Diagnosis not present

## 2017-07-13 DIAGNOSIS — Z944 Liver transplant status: Secondary | ICD-10-CM | POA: Diagnosis not present

## 2017-07-13 DIAGNOSIS — B259 Cytomegaloviral disease, unspecified: Secondary | ICD-10-CM | POA: Diagnosis not present

## 2017-07-18 LAB — HEMOGLOBIN A1C: Hemoglobin A1C: 6.5

## 2017-08-02 ENCOUNTER — Encounter: Payer: Self-pay | Admitting: Internal Medicine

## 2017-08-02 ENCOUNTER — Ambulatory Visit (INDEPENDENT_AMBULATORY_CARE_PROVIDER_SITE_OTHER): Payer: Medicare Other | Admitting: Internal Medicine

## 2017-08-02 VITALS — BP 142/72 | HR 70 | Resp 16 | Ht 68.0 in | Wt 169.0 lb

## 2017-08-02 DIAGNOSIS — E785 Hyperlipidemia, unspecified: Secondary | ICD-10-CM

## 2017-08-02 DIAGNOSIS — Z0000 Encounter for general adult medical examination without abnormal findings: Secondary | ICD-10-CM

## 2017-08-02 DIAGNOSIS — M109 Gout, unspecified: Secondary | ICD-10-CM

## 2017-08-02 DIAGNOSIS — E119 Type 2 diabetes mellitus without complications: Secondary | ICD-10-CM | POA: Diagnosis not present

## 2017-08-02 DIAGNOSIS — I10 Essential (primary) hypertension: Secondary | ICD-10-CM

## 2017-08-02 DIAGNOSIS — Z9189 Other specified personal risk factors, not elsewhere classified: Secondary | ICD-10-CM | POA: Diagnosis not present

## 2017-08-02 LAB — COMPREHENSIVE METABOLIC PANEL
ALT: 25 U/L (ref 0–53)
AST: 25 U/L (ref 0–37)
Albumin: 3.8 g/dL (ref 3.5–5.2)
Alkaline Phosphatase: 73 U/L (ref 39–117)
BUN: 25 mg/dL — AB (ref 6–23)
CO2: 30 meq/L (ref 19–32)
CREATININE: 0.99 mg/dL (ref 0.40–1.50)
Calcium: 9.8 mg/dL (ref 8.4–10.5)
Chloride: 102 mEq/L (ref 96–112)
GFR: 98.12 mL/min (ref >60.00)
Glucose, Bld: 114 mg/dL — ABNORMAL HIGH (ref 70–99)
Potassium: 4.4 mEq/L (ref 3.5–5.1)
SODIUM: 136 meq/L (ref 135–145)
Total Bilirubin: 1 mg/dL (ref 0.2–1.2)
Total Protein: 8.2 g/dL (ref 6.0–8.3)

## 2017-08-02 LAB — LIPID PANEL
CHOL/HDL RATIO: 4
Cholesterol: 157 mg/dL (ref 0–200)
HDL: 42.1 mg/dL (ref >39.00)
LDL Cholesterol: 88 mg/dL (ref 0–99)
NonHDL: 115.16
Triglycerides: 136 mg/dL (ref 0.0–149.0)
VLDL: 27.2 mg/dL (ref 0.0–40.0)

## 2017-08-02 MED ORDER — CARVEDILOL 6.25 MG PO TABS: 6.2500 mg | ORAL_TABLET | Freq: Two times a day (BID) | ORAL | 6 refills | Status: DC

## 2017-08-02 NOTE — Assessment & Plan Note (Addendum)
Gets regular care @ the [place] -Td 2014; pneumonia shot 2007-2014; prevnar-- 2016 -prostate ca screening: reports had a PSA less than a year ago @ the [facility]: Cscope 5-10, diverticuli cscope at the [place] 07-12-2017, 1 polyps, 10 years ? -Diet-exercise discussed  -Labs: CMP, FLP

## 2017-08-02 NOTE — Patient Instructions (Addendum)
GO TO THE LAB : Get the blood work     GO TO THE FRONT DESK Schedule your next appointment for a physical exam in  3 months   Stop hydrochlorothiazide Start carvedilol twice a day   Check the  blood pressure 2 or 3 times a   week   Be sure your blood pressure is between 110/65 and  135/85. If it is consistently higher or lower, let me know  Also check your pulse, should not be less than 55  Monitor your skin: If you see any  Lesion that are growing, changing colors, getting darker or bleeding : call or  see a dermatologist.

## 2017-08-02 NOTE — Progress Notes (Signed)
Subjective:    Patient ID: Jeffery York, male    DOB: 1955/12/22, 62 y.o.   MRN: [id number]  DOS:  08/02/2017 Type of visit - description : CPX Interval history: Patient is frequently check at the [place].  He brought some records.  BP Readings from Last 3 Encounters:  08/02/17 (!) 142/72  12/21/16 124/74  11/29/16 120/71     Review of Systems In the last few months, gout episodes have increased, had approximately 4 of them. Started allopurinol 4 months ago yet this month had 2 episodes of gout.  Other than above, a 14 point review of systems is negative     Past Medical History:  Diagnosis Date  . Benign prostatic hypertrophy   . Cirrhosis ([place])   . Diabetes mellitus    initially 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 but then the diabetes resurface without the use of steroids  . [place] (hepatocellular carcinoma) ([place])    s/p TACE and microwave ablation  . Hep C w/o coma, chronic (HCC)    bx aprox 12-10, s/p Harvoni, on a transplant list candidate   . History of sarcoidosis    used steroids x 10 years  . Hyperlipidemia   . Hypertension   . 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 Uveitis    h/o    Past Surgical History:  Procedure Laterality Date  . LIVER TRANSPLANT  04-2015  . lung resection aspergilloma  09-05-1998    Social History   Socioeconomic History  . Marital status: Married    Spouse name: Not on file  . Number of children: 0  . Years of education: Not on file  . Highest education level: Not on file  Social Needs  . Financial resource strain: Not on file  . Food insecurity - worry: Not on file  . Food insecurity - inability: Not on file  . Transportation needs - medical: Not on file  . Transportation needs - non-medical: Not on file  Occupational History  . Occupation: retired Nature conservation officer  Tobacco Use  . Smoking status: Former Research scientist (life sciences)  . Smokeless tobacco: Never Used  . Tobacco comment: remote smoker  Substance and Sexual Activity  . Alcohol use: No  . Drug use: No  .  Sexual activity: Yes  Other Topics Concern  . Not on file  Social History Narrative   Mother- Lance Bosch one of my patients, deceased ~ 2011-09-05   Married, No children      Family History  Problem Relation Age of Onset  . Prostate cancer Brother        at age 36s  . Pancreatic cancer Brother   . CAD Brother   . Lung cancer Father   . Diabetes Other        father side   . Colon cancer Neg Hx      Allergies as of 08/02/2017      Reactions   Benazepril Hcl Swelling   angioedema   Erythromycin Ethylsuccinate    REACTION: GI Intolerance/vomiting   Keflex [cephalexin] Diarrhea   With cramps and Nausea      Medication List        Accurate as of 08/02/17 11:59 PM. Always use your most recent med list.          amLODipine 10 MG tablet Commonly known as:  NORVASC Take 1 tablet (10 mg total) by mouth daily.   ascorbic acid 500 MG tablet Commonly known as:  VITAMIN C Take 500 mg by mouth  daily.   carvedilol 6.25 MG tablet Commonly known as:  COREG Take 1 tablet (6.25 mg total) by mouth 2 (two) times daily with a meal.   cetirizine 10 MG tablet Commonly known as:  ZYRTEC Take 10 mg by mouth daily.   cholecalciferol 1000 units tablet Commonly known as:  VITAMIN D Take 1,500 Units by mouth daily.   cyanocobalamin 1000 MCG tablet Take 1,500 mcg by mouth daily.   fluticasone 50 MCG/ACT nasal spray Commonly known as:  FLONASE Place 2 sprays into both nostrils daily as needed for allergies.   LEVEMIR FLEXTOUCH 100 UNIT/ML Pen Generic drug:  Insulin Detemir 18 Units daily.   Magnesium Oxide 400 (240 Mg) MG Tabs   sildenafil 100 MG tablet Commonly known as:  VIAGRA   tacrolimus 1 MG capsule Commonly known as:  PROGRAF Take 3 mg by mouth 2 (two) times daily.   ZYLOPRIM 100 MG tablet Generic drug:  allopurinol          Objective:   Physical Exam BP (!) 142/72 (BP Location: Left Arm, Patient Position: Sitting, Cuff Size: Normal)   Pulse 70   Resp 16    Ht 5\' 8"  (1.727 m)   Wt 169 lb (76.7 kg)   SpO2 99%   BMI 25.70 kg/m  General:   Well developed, well nourished . NAD.  Neck: No  thyromegaly  HEENT:  Normocephalic . Face symmetric, atraumatic Lungs:  CTA B Normal respiratory effort, no intercostal retractions, no accessory muscle use. Heart: RRR,  no murmur.  No pretibial edema bilaterally  Abdomen:  Not distended, soft, non-tender. No rebound or rigidity.   Skin: Exposed areas without rash. Not pale. Not jaundice Neurologic:  alert & oriented X3.  Speech normal, gait appropriate for age and unassisted Strength symmetric and appropriate for age.  Psych: Cognition and judgment appear intact.  Cooperative with normal attention span and concentration.  Behavior appropriate. No anxious or depressed appearing.     Assessment & Plan:   Assessment  DM: per Dr Loanne Drilling -- Initially 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 then it resurfaced w/o steroids HTN Hyperlipidemia Gout BPH-- failed Flomax before ED ?Osteoporosis steroid-induced: dexa wnl 2010 H/o uveitis Sarcoidosis s/p steroids for 10 years  GI:  f/u at [place]  -- Liver transplant  04-07-2015 @ [place]   --Hep C: S/p harvoni failure   --Cirrhosis --Hepatocellular ca, s/p TACE- microwave ablation --> failed, had chemo embolization 03-2015  --Reportedly get hepatitis A-B shots from GI   PLAN: Gets his regular care at the [place] DM: Humalog was discontinued, currently on Levemir.  Managed mostly at the [place], A1c 6.5 few days ago. HTN: Currently on amlodipine 10 mg and HCTZ 12.5 mg daily.  Having frequent gout episodes.  Ambulatory BP is 140/80. Plan: Stop HCTZ, carvedilol 6.25 B.I.D., monitor BPs. Gout: Recent increasing episodes likely because he started HCTZ few months ago.  Uric acid 5.6 when checked at the [facility] 07/12/2017.  Continue allopurinol but likely will be able to d/c since hctz is stopped today. Hyperlipidemia: Checking labs.  Diet controlled Liver transplant, cirrhosis,  hepatocellular CA: Follow-up elsewhere.  Due to chronic immunosuppression, he is at a high risk of skin cancer and osteoporosis per hepatology.  Warning symptoms of the skin cancer were discussed, declined derm referral, check a bone density test. RTC 3 months for gout mngmt

## 2017-08-03 NOTE — Assessment & Plan Note (Signed)
Gets his regular care at the [place] DM: Humalog was discontinued, currently on Levemir.  Managed mostly at the [place], A1c 6.5 few days ago. HTN: Currently on amlodipine 10 mg and HCTZ 12.5 mg daily.  Having frequent gout episodes.  Ambulatory BP is 140/80. Plan: Stop HCTZ, carvedilol 6.25 B.I.D., monitor BPs. Gout: Recent increasing episodes likely because he started HCTZ few months ago.  Uric acid 5.6 when checked at the [facility] 07/12/2017.  Continue allopurinol but likely will be able to d/c since hctz is stopped today. Hyperlipidemia: Checking labs.  Diet controlled Liver transplant, cirrhosis, hepatocellular CA: Follow-up elsewhere.  Due to chronic immunosuppression, he is at a high risk of skin cancer and osteoporosis per hepatology.  Warning symptoms of the skin cancer were discussed, declined derm referral, check a bone density test. RTC 3 months for gout mngmt

## 2017-08-07 ENCOUNTER — Other Ambulatory Visit: Payer: Self-pay | Admitting: Internal Medicine

## 2017-08-24 IMAGING — DX DG CHEST 1V PORT
1 series · 1 of 1 positions shown · non-contrast
Comparison: 10/24/2007

CLINICAL DATA: Weakness and shortness of breath today. Angioedema.
History of sarcoid and partial right pneumonectomy.

EXAM:
PORTABLE CHEST 1 VIEW

[chest ap]
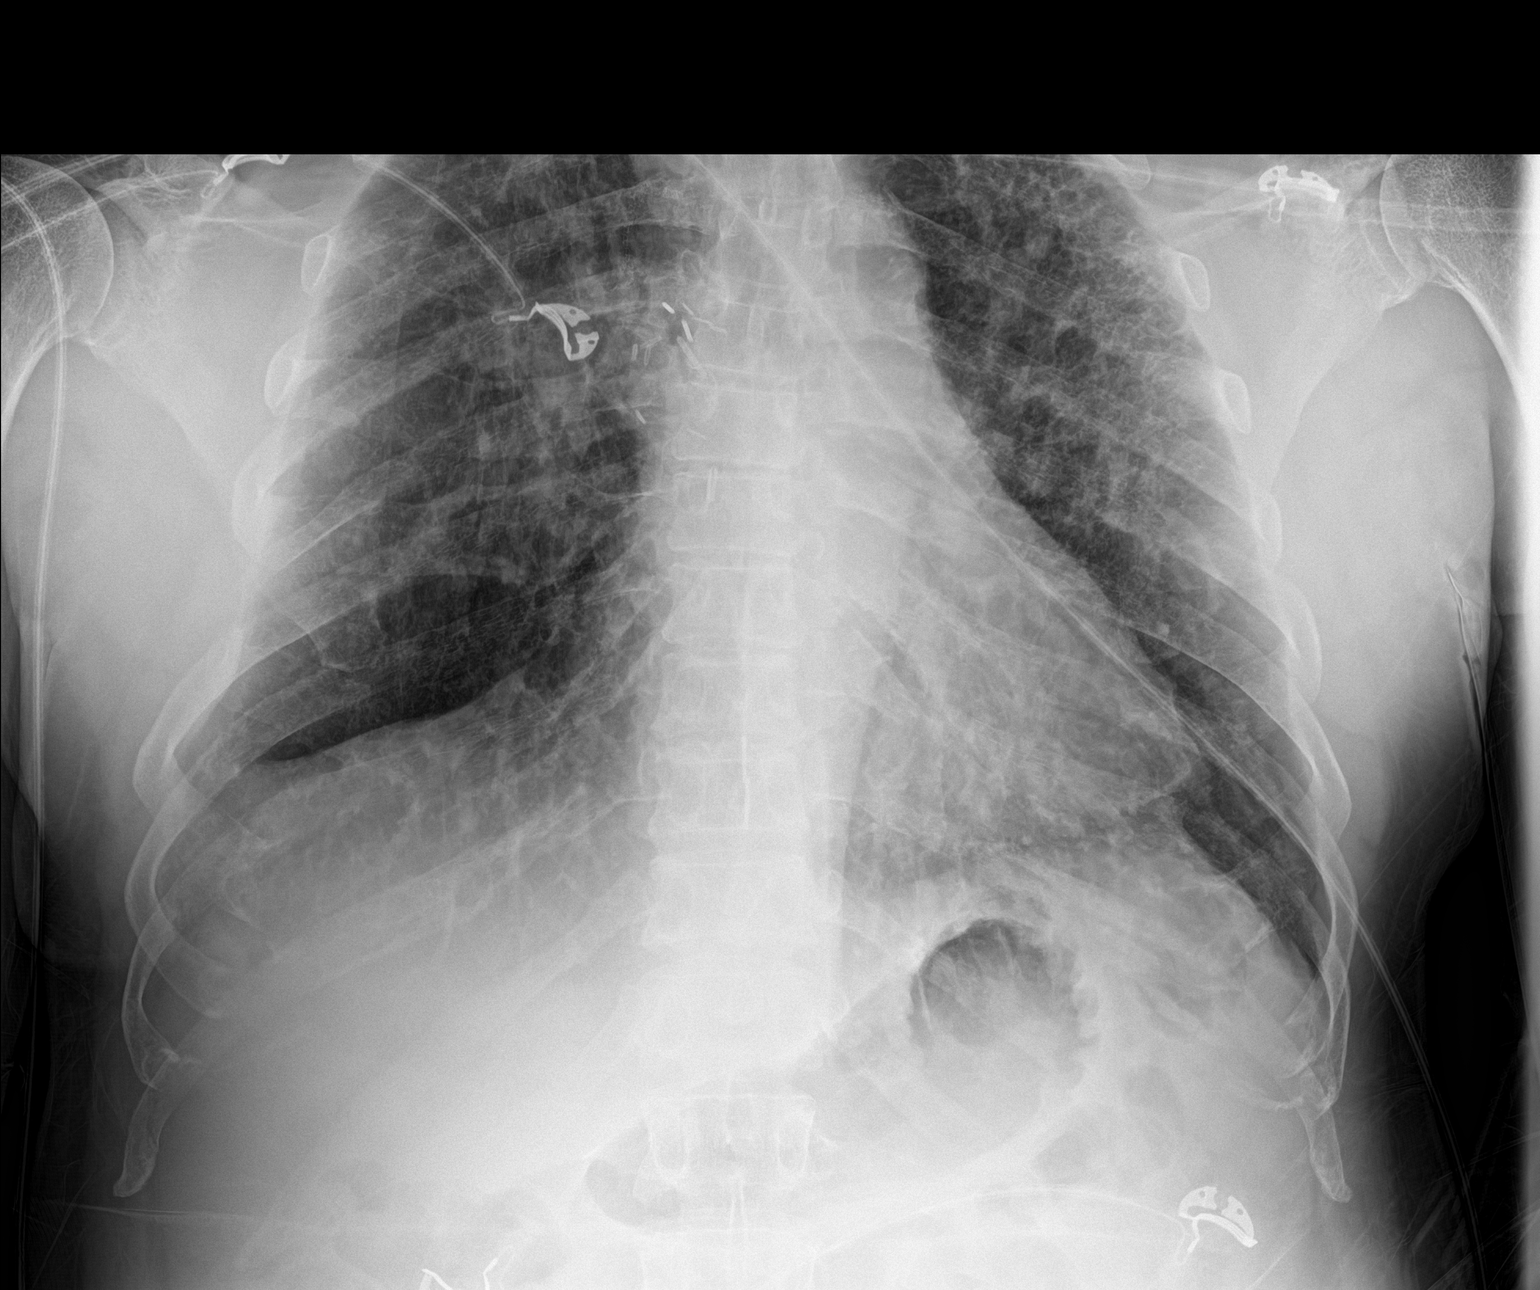

[1 of 1 positions shown; findings below may reference images not displayed]

FINDINGS: Cardiomediastinal silhouette is unchanged.

Right lung postoperative changes again identified.

Chronic interstitial opacities bilaterally are again identified.

No pleural effusion, definite airspace disease, mass or pneumothorax
noted.

No acute bony abnormalities are present.
IMPRESSION: No evidence of acute abnormality. Little significant change from
3226 with chronic pulmonary changes.

## 2017-08-29 ENCOUNTER — Telehealth: Payer: Self-pay

## 2017-08-29 MED ORDER — COLCHICINE 0.6 MG PO CAPS: 0.6000 mg | ORAL_CAPSULE | Freq: Two times a day (BID) | ORAL | 0 refills | Status: DC | PRN

## 2017-08-29 NOTE — Telephone Encounter (Signed)
Copied from [place] [id number]. Topic: General - Other >> Aug 29, 2017 12:41 PM Carolyn Stare wrote:  Pt cal lto say he is having a gout flare up and is asking if the below med can be called in for him  [place]

## 2017-08-29 NOTE — Telephone Encounter (Signed)
Ok colchicine 0.6 mg 1 po bid prn gout #60, no RF OV or UC if severe symptoms

## 2017-08-29 NOTE — Telephone Encounter (Signed)
Please advise 

## 2017-08-29 NOTE — Telephone Encounter (Signed)
Pt informed that Rx has been sent to [place]. Informed OV if severe sx's or if not improving. Pt verbalized understanding.

## 2017-10-08 ENCOUNTER — Other Ambulatory Visit: Payer: Self-pay | Admitting: Internal Medicine

## 2017-11-02 ENCOUNTER — Ambulatory Visit: Payer: Medicare Other | Admitting: Internal Medicine

## 2017-11-07 ENCOUNTER — Ambulatory Visit (INDEPENDENT_AMBULATORY_CARE_PROVIDER_SITE_OTHER): Payer: Medicare Other | Admitting: Internal Medicine

## 2017-11-07 ENCOUNTER — Encounter: Payer: Self-pay | Admitting: Internal Medicine

## 2017-11-07 VITALS — BP 130/84 | HR 60 | Temp 98.4°F | Resp 16 | Ht 68.0 in | Wt 170.0 lb

## 2017-11-07 DIAGNOSIS — M109 Gout, unspecified: Secondary | ICD-10-CM

## 2017-11-07 DIAGNOSIS — I10 Essential (primary) hypertension: Secondary | ICD-10-CM

## 2017-11-07 DIAGNOSIS — G4733 Obstructive sleep apnea (adult) (pediatric): Secondary | ICD-10-CM

## 2017-11-07 NOTE — Patient Instructions (Addendum)
  GO TO THE FRONT DESK Schedule your next appointment for a physical exam by 08-2018

## 2017-11-07 NOTE — Progress Notes (Signed)
Subjective:    Patient ID: Jeffery York, male    DOB: Mar 19, 1956, 62 y.o.   MRN: [id number]  DOS:  11/07/2017 Type of visit - description : f/u Interval history: HTN: Since the last office visit, BP remain elevated, he decided to discontinue carvedilol and actually BP is now in the 120s when checked. Gout: [facility] rheumatology [facility], allopurinol dose increased, no recent attacks Saw his dentist, question of sleep apnea, further eval?   Review of Systems Wife reports he snores sometimes, no apnea noted. Energy is usually okay, from time to time he does need to take a nap  Past Medical History:  Diagnosis Date  . Benign prostatic hypertrophy   . Cirrhosis ([place])   . Diabetes mellitus    initially 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 but then the diabetes resurface without the use of steroids  . [place] (hepatocellular carcinoma) ([place])    s/p TACE and microwave ablation  . Hep C w/o coma, chronic (HCC)    bx aprox 12-10, s/p Harvoni, on a transplant list candidate   . History of sarcoidosis    used steroids x 10 years  . Hyperlipidemia   . Hypertension   . 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 Uveitis    h/o    Past Surgical History:  Procedure Laterality Date  . LIVER TRANSPLANT  04-2015  . 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 Marital status: Married    Spouse name: Not on file  . Number of children: 0  . Years of education: Not on file  . Highest education level: Not on file  Occupational History  . Occupation: retired Wellsite geologist  . Financial resource strain: Not on file  . Food insecurity:    Worry: Not on file    Inability: Not on file  . Transportation needs:    Medical: Not on file    Non-medical: Not on file  Tobacco Use  . Smoking status: Former Research scientist (life sciences)  . Smokeless tobacco: Never Used  . Tobacco comment: remote smoker  Substance and Sexual Activity  . Alcohol use: No  . Drug use: No  . Sexual activity: Yes    Lifestyle  . Physical activity:    Days per week: Not on file    Minutes per session: Not on file  . Stress: Not on file  Relationships  . Social connections:    Talks on phone: Not on file    Gets together: Not on file    Attends religious service: Not on file    Active member of club or organization: Not on file    Attends meetings of clubs or organizations: Not on file    Relationship status: Not on file  . Intimate partner violence:    Fear of current or ex partner: Not on file    Emotionally abused: Not on file    Physically abused: Not on file    Forced sexual activity: Not on file  Other Topics Concern  . Not on file  Social History Narrative   Mother- Lance Bosch one of my patients, deceased ~ 09/25/11   Married, No children       Allergies as of 11/07/2017      Reactions   Benazepril Hcl Swelling   angioedema   Erythromycin Ethylsuccinate    REACTION: GI Intolerance/vomiting   Keflex [cephalexin] Diarrhea   With cramps and Nausea  Medication List        Accurate as of 11/07/17  6:20 PM. Always use your most recent med list.          allopurinol 300 MG tablet Commonly known as:  ZYLOPRIM   amLODipine 10 MG tablet Commonly known as:  NORVASC Take 1 tablet (10 mg total) by mouth daily.   ascorbic acid 500 MG tablet Commonly known as:  VITAMIN C Take 500 mg by mouth daily.   cetirizine 10 MG tablet Commonly known as:  ZYRTEC Take 10 mg by mouth daily.   cholecalciferol 1000 units tablet Commonly known as:  VITAMIN D Take 1,500 Units by mouth daily.   Colchicine 0.6 MG Caps Take 0.6 mg by mouth 2 (two) times daily as needed (gout flare).   cyanocobalamin 1000 MCG tablet Take 1,500 mcg by mouth daily.   fluticasone 50 MCG/ACT nasal spray Commonly known as:  FLONASE Place 2 sprays into both nostrils daily as needed for allergies.   LEVEMIR FLEXTOUCH 100 UNIT/ML Pen Generic drug:  Insulin Detemir 18 Units daily.   Magnesium Oxide 400 (240  Mg) MG Tabs   sildenafil 100 MG tablet Commonly known as:  VIAGRA   tacrolimus 1 MG capsule Commonly known as:  PROGRAF Take 3 mg by mouth 2 (two) times daily.          Objective:   Physical Exam BP 130/84 (BP Location: Left Arm, Patient Position: Sitting, Cuff Size: Small)   Pulse 60   Temp 98.4 F (36.9 C) (Oral)   Resp 16   Ht 5\' 8"  (1.727 m)   Wt 170 lb (77.1 kg)   SpO2 98%   BMI 25.85 kg/m  General:   Well developed, well nourished . NAD.  HEENT:  Normocephalic . Face symmetric, atraumatic Lungs:  CTA B Normal respiratory effort, no intercostal retractions, no accessory muscle use. Heart: RRR,  no murmur.  No pretibial edema bilaterally  Skin: Not pale. Not jaundice Neurologic:  alert & oriented X3.  Speech normal, gait appropriate for age and unassisted Psych--  Cognition and judgment appear intact.  Cooperative with normal attention span and concentration.  Behavior appropriate. No anxious or depressed appearing.      Assessment & Plan:   Assessment  DM: per Dr Loanne Drilling -- Initially 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 then it resurfaced w/o steroids HTN Hyperlipidemia Gout BPH-- failed Flomax before ED ?Osteoporosis steroid-induced: dexa wnl 2010 H/o uveitis Sarcoidosis s/p steroids for 10 years  GI:  f/u at [facility]  -- Liver transplant  04-07-2015 @ [place]   --Hep C: S/p harvoni failure   --Cirrhosis --Hepatocellular ca, s/p TACE- microwave ablation --> failed, had chemo embolization 03-2015  --Reportedly get hepatitis A-B shots from GI   PLAN: Gets  his regular care at the [place] [facility]: [facility] HTN: See last visit, HCTZ discontinued, carvedilol added, BP was elevated that he decided to stop carvedilol.  For some reason, since then his BP is in the 120s, 130s will check.  Currently on amlodipine.  No change. Gout: Since the last office visit, saw the rheumatologist at the [facility], x-rays done, in addition to gout he was dx w/  DJD.  Allopurinol dose increased to 300 mg  and he is taking colchicine daily.  No further symptoms. Liver transplant, cirrhosis, hepatocellular CA: Did not proceed with a bone density test Sleep apnea? + snoring, epworth scale 11 +, refer to pulmonary RTC 08-2018 CPX

## 2017-12-18 ENCOUNTER — Encounter: Payer: Self-pay | Admitting: Pulmonary Disease

## 2017-12-18 ENCOUNTER — Ambulatory Visit (INDEPENDENT_AMBULATORY_CARE_PROVIDER_SITE_OTHER): Payer: Medicare Other | Admitting: Pulmonary Disease

## 2017-12-18 VITALS — BP 124/66 | HR 72 | Ht 68.5 in | Wt 170.0 lb

## 2017-12-18 DIAGNOSIS — R0683 Snoring: Secondary | ICD-10-CM

## 2017-12-18 DIAGNOSIS — G4719 Other hypersomnia: Secondary | ICD-10-CM | POA: Diagnosis not present

## 2017-12-18 NOTE — Patient Instructions (Signed)
Will arrange for home sleep study Will call to arrange for follow up after sleep study reviewed  

## 2017-12-18 NOTE — Progress Notes (Signed)
   Subjective:    Patient ID: Jeffery York, male    DOB: Jul 18, 1955, 62 y.o.   MRN: [id number]  HPI    Review of Systems  Constitutional: Positive for fatigue. Negative for fever and unexpected weight change.  HENT: Positive for dental problem. Negative for congestion, ear pain, nosebleeds, postnasal drip, rhinorrhea, sinus pressure, sneezing, sore throat and trouble swallowing.   Eyes: Negative for redness and itching.  Respiratory: Negative for cough, chest tightness, shortness of breath and wheezing.   Cardiovascular: Negative for palpitations and leg swelling.  Gastrointestinal: Negative for nausea and vomiting.  Genitourinary: Negative for dysuria.  Musculoskeletal: Negative for joint swelling.  Skin: Negative for rash.  Neurological: Negative for headaches.  Hematological: Does not bruise/bleed easily.  Psychiatric/Behavioral: Negative for dysphoric mood. The patient is not nervous/anxious.        Objective:   Physical Exam        Assessment & Plan:

## 2017-12-18 NOTE — Progress Notes (Signed)
[facility]  Chief Complaint  Patient presents with  . sleep consult    Referred by Dr. Larose Kells for possible sleep apnea. Epworth Score: 7    Constitutional: BP 124/66 (BP Location: Right Arm, Cuff Size: Normal)   Pulse 72   Ht 5' 8.5" (1.74 m)   Wt 170 lb (77.1 kg)   SpO2 99%   BMI 25.47 kg/m   History of Present Illness: Jeffery York is a 62 y.o. male with snoring.  He has hx of pulmonary sarcoidosis dx in 1990, s/p Rt upper lobectomy for aspergilloma in 2000.  He had liver transplant in 2016 for Hep C and liver cancer.  I saw him previously in hospital in 2018 for ACE induced angioedema.  He was also seen previously by Dr. Melvyn Novas for sarcoidosis.  He was seen by his dentist recently and was told he grinds his teeth and this could be related to sleep apnea.  He has noticed trouble with his sleep for years.  He snores, and his wife says he stops breathing while asleep.  He always sleeps on his back.  He is waking up more often during dreams.  He will need to take naps during the day.  He goes to sleep at 1115 pm.  He falls asleep in minutes.  He wakes up several times to use the bathroom.  He gets out of bed at 6 am.  He feels tired in the morning.  He denies morning headache.  He does not use anything to help him fall sleep.  He drinks several cups of coffee during the day.    He denies sleep walking, sleep talking, or nightmares.  There is no history of restless legs.  He denies sleep hallucinations, sleep paralysis, or cataplexy.  The Epworth score is 7 out of 24.   Comprehensive Respiratory Exam:  Appearance - well kempt  ENMT - nasal mucosa moist, turbinates clear, midline nasal septum, no dental lesions, no gingival bleeding, no oral exudates, no tonsillar hypertrophy, MP 4, enlarged tongue Neck - no masses, trachea midline, no thyromegaly, no elevation in JVP Respiratory - normal appearance of chest wall, normal respiratory effort w/o  accessory muscle use, no dullness on percussion, bronchial breath sounds more in upper lobes b/l CV - s1s2 regular rate and rhythm, no murmurs, no peripheral edema, no varicosities, radial pulses symmetric GI - soft, non tender, no masses Lymph - no adenopathy noted in neck and axillary areas MSK - normal muscle strength and tone, normal gait Ext - no cyanosis, clubbing, or joint inflammation noted Skin - no rashes, lesions, or ulcers Neuro - oriented to person, place, and time Psych - normal mood and affect  Discussion: He has snoring, sleep disruption, daytime sleepiness, and witnessed apnea.  He has hx of DM, HTN.  I am concerned he could have sleep apnea.  Assessment/Plan:   Snoring with excessive daytime sleepiness. - will need to arrange for a home sleep study - he might be a good candidate for an oral appliance - [facility] through [facility] if he is found to have sleep apnea  Cardiovascular risk. - had an extensive discussion regarding the adverse health consequences related to untreated sleep disordered breathing - specifically discussed the risks for hypertension, coronary artery disease, cardiac dysrhythmias, cerebrovascular disease, and diabetes - lifestyle modification discussed  Safe driving practices. - discussed how sleep disruption can increase risk of  accidents, particularly when driving - safe driving practices were discussed  Therapies for obstructive sleep apnea. - if the sleep study shows significant sleep apnea, then various therapies for treatment were reviewed: CPAP, oral appliance, and surgical interventions    Patient Instructions  Will arrange for home sleep study Will call to arrange for follow up after sleep study reviewed     Chesley Mires, MD [place] 12/18/2017, 2:56 PM  Flow Sheet  Pulmonary tests: CT chest 03/08/17 >> extensive scarring, s/p RULectomy  Sleep  tests:  Review of Systems: Constitutional: Positive for fatigue. Negative for fever and unexpected weight change.  HENT: Positive for dental problem. Negative for congestion, ear pain, nosebleeds, postnasal drip, rhinorrhea, sinus pressure, sneezing, sore throat and trouble swallowing.   Eyes: Negative for redness and itching.  Respiratory: Negative for cough, chest tightness, shortness of breath and wheezing.   Cardiovascular: Negative for palpitations and leg swelling.  Gastrointestinal: Negative for nausea and vomiting.  Genitourinary: Negative for dysuria.  Musculoskeletal: Negative for joint swelling.  Skin: Negative for rash.  Neurological: Negative for headaches.  Hematological: Does not bruise/bleed easily.  Psychiatric/Behavioral: Negative for dysphoric mood. The patient is not nervous/anxious.    Past Medical History: He  has a past medical history of Benign prostatic hypertrophy, Cirrhosis ([place]), Diabetes mellitus, HCC (hepatocellular carcinoma) ([place]), Hep C w/o coma, chronic ([place]), History of sarcoidosis, Hyperlipidemia, Hypertension, Osteoporosis, and Uveitis.  Past Surgical History: He  has a past surgical history that includes lung resection aspergilloma (2000) and Liver transplant (04-2015).  Family History: His family history includes CAD in his brother; Diabetes in his other; Lung cancer in his father; Pancreatic cancer in his brother; Prostate cancer in his brother.  Social History: He  reports that he has quit smoking. He has never used smokeless tobacco. He reports that he does not drink alcohol or use drugs.  Medications: Allergies as of 12/18/2017      Reactions   Benazepril Hcl Swelling   angioedema   Erythromycin Ethylsuccinate    REACTION: GI Intolerance/vomiting   Keflex [cephalexin] Diarrhea   With cramps and Nausea      Medication List        Accurate as of 12/18/17  2:56 PM. Always use your most recent med list.          allopurinol 300 MG  tablet Commonly known as:  ZYLOPRIM   amLODipine 10 MG tablet Commonly known as:  NORVASC Take 1 tablet (10 mg total) by mouth daily.   ascorbic acid 500 MG tablet Commonly known as:  VITAMIN C Take 500 mg by mouth daily.   cetirizine 10 MG tablet Commonly known as:  ZYRTEC Take 10 mg by mouth daily.   cholecalciferol 1000 units tablet Commonly known as:  VITAMIN D Take 1,500 Units by mouth daily.   Colchicine 0.6 MG Caps Take 0.6 mg by mouth 2 (two) times daily as needed (gout flare).   cyanocobalamin 1000 MCG tablet Take 1,500 mcg by mouth daily.   fluticasone 50 MCG/ACT nasal spray Commonly known as:  FLONASE Place 2 sprays into both nostrils daily as needed for allergies.   LEVEMIR FLEXTOUCH 100 UNIT/ML Pen Generic drug:  Insulin Detemir 20 Units daily.   Magnesium Oxide 400 (240 Mg) MG Tabs   sildenafil 100 MG tablet Commonly known as:  VIAGRA   tacrolimus 1 MG capsule Commonly known as:  PROGRAF Take 3 mg by mouth 2 (two) times daily.

## 2018-01-11 DIAGNOSIS — G4733 Obstructive sleep apnea (adult) (pediatric): Secondary | ICD-10-CM | POA: Diagnosis not present

## 2018-01-14 ENCOUNTER — Other Ambulatory Visit: Payer: Self-pay | Admitting: *Deleted

## 2018-01-14 DIAGNOSIS — G4719 Other hypersomnia: Secondary | ICD-10-CM

## 2018-01-14 DIAGNOSIS — R0683 Snoring: Secondary | ICD-10-CM

## 2018-01-15 ENCOUNTER — Telehealth: Payer: Self-pay | Admitting: Pulmonary Disease

## 2018-01-15 ENCOUNTER — Encounter: Payer: Self-pay | Admitting: Pulmonary Disease

## 2018-01-15 DIAGNOSIS — G4733 Obstructive sleep apnea (adult) (pediatric): Secondary | ICD-10-CM | POA: Diagnosis not present

## 2018-01-15 HISTORY — DX: Obstructive sleep apnea (adult) (pediatric): G47.33

## 2018-01-15 NOTE — Telephone Encounter (Signed)
HST 01/11/18 >> AHI 19.9, SaO2 low 78%   Will have my nurse inform pt that sleep study shows moderate sleep apnea.  Options are 1) CPAP now, 2) ROV first.  If He is agreeable to CPAP, then please send order for auto CPAP range 5 to 15 cm H2O with heated humidity and mask of choice.  Have download sent 1 month after starting CPAP and set up ROV 2 months after starting CPAP.  ROV can be with me or NP.

## 2018-01-17 NOTE — Telephone Encounter (Signed)
Called and spoke with patient regarding results.  Informed the patient of results and recommendations today. Placed order for auto CPAP range 5 to 15 cm H2O with heated humidity and mask of choice.   Have download sent 1 month after starting CPAP and set up ROV 2 months after starting CPAP.   Scheduled 61mo f/u with VS for 03/25/18 at 9am Pt verbalized understanding and denied any questions or concerns at this time.  Nothing further needed.

## 2018-03-25 ENCOUNTER — Encounter: Payer: Self-pay | Admitting: Pulmonary Disease

## 2018-03-25 ENCOUNTER — Ambulatory Visit (INDEPENDENT_AMBULATORY_CARE_PROVIDER_SITE_OTHER): Payer: Medicare Other | Admitting: Pulmonary Disease

## 2018-03-25 VITALS — BP 122/76 | HR 65 | Ht 68.0 in | Wt 174.8 lb

## 2018-03-25 DIAGNOSIS — Z23 Encounter for immunization: Secondary | ICD-10-CM

## 2018-03-25 DIAGNOSIS — Z7189 Other specified counseling: Secondary | ICD-10-CM

## 2018-03-25 DIAGNOSIS — Z9989 Dependence on other enabling machines and devices: Secondary | ICD-10-CM

## 2018-03-25 DIAGNOSIS — G4733 Obstructive sleep apnea (adult) (pediatric): Secondary | ICD-10-CM | POA: Diagnosis not present

## 2018-03-25 NOTE — Patient Instructions (Signed)
Will arrange for CPAP mask refitting  Follow up in 1 year 

## 2018-03-25 NOTE — Progress Notes (Signed)
[facility]  Chief Complaint  Patient presents with  . Follow-up    New cpap start, 41mo f/u. Pt had some issues with cpap mask, is doing better in last few weeks. DME-[facility]    Constitutional:  BP 122/76 (BP Location: Left Arm, Cuff Size: Normal)   Pulse 65   Ht 5\' 8"  (1.727 m)   Wt 174 lb 12.8 oz (79.3 kg)   SpO2 99%   BMI 26.58 kg/m   Past Medical History:  BPH, Cirrhosis of liver from Hep C s/p transplant 2016, Hepatocellular carcinoma, HLD, HTN, Uveitis, Osteoporosis, Diabetes mellitus, Gout  Brief Summary:  Jeffery York is a 62 y.o. male former smoker with obstructive sleep apnea.  He has hx of pulmonary sarcoidosis dx in 1990, s/p Rt upper lobectomy for aspergilloma in 2000.  He had home sleep study.  Showed moderate sleep apnea.  Started on CPAP.  Uses [facility].  He has nasal pillows mask.  Has some mask leak and dry mouth.  Got a chin strap.  This helps some.  He wants to try full face mask.  Sleeping better and feels more rested.  Noticed his blood pressure is better and blood sugars running lower.   Physical Exam:   Appearance - well kempt  ENMT - clear nasal mucosa, midline nasal septum, no oral exudates, no LAN, trachea midline Respiratory - normal chest wall, normal respiratory effort, no accessory muscle use, no wheeze/rales CV - s1s2 regular rate and rhythm, no murmurs, no peripheral edema, radial pulses symmetric GI - soft, non tender, no masses Lymph - no adenopathy noted in neck and axillary areas MSK - normal muscle strength and tone, normal gait Ext - no cyanosis, clubbing, or joint inflammation noted Skin - no rashes, lesions, or ulcers Neuro - oriented to person, place, and time Psych - normal mood and affect   Assessment/Plan:   Obstructive sleep apnea. - he is compliant with CPAP and reports benefit - will arrange for CPAP mask refitting - continue auto CPAP 5 to 10 cm H2O - discussed how sleep apnea therapy  can improve hypertension and diabetes control   Patient Instructions  Will arrange for CPAP mask refitting  Follow up in 1 year    Chesley Mires, MD [place] Pager: [phone] 03/25/2018, 9:30 AM  Flow Sheet    Pulmonary tests:  CT chest 03/08/17 >> extensive scarring, s/p RULectomy  Sleep tests:  HST 01/11/18 >> AHI 19.9, SaO2 low 78% Auto CPAP 02/20/18 to 03/21/18 >> used on 30 of 30 nights with average 7 hrs 34 min.  Average AHI 4.2 with median CPAP 8 and 95 th percentile CPAP 10 cm H2O.   Medications:   Allergies as of 03/25/2018      Reactions   Benazepril Hcl Swelling   angioedema   Erythromycin Ethylsuccinate    REACTION: GI Intolerance/vomiting   Keflex [cephalexin] Diarrhea   With cramps and Nausea      Medication List        Accurate as of 03/25/18  9:30 AM. Always use your most recent med list.          allopurinol 300 MG tablet Commonly known as:  ZYLOPRIM   amLODipine 10 MG tablet Commonly known as:  NORVASC Take 1 tablet (10 mg total) by mouth daily.   ascorbic acid 500 MG tablet Commonly known as:  VITAMIN C Take 500 mg by mouth daily.   cetirizine 10 MG tablet Commonly known  as:  ZYRTEC Take 10 mg by mouth daily.   cholecalciferol 1000 units tablet Commonly known as:  VITAMIN D Take 1,500 Units by mouth daily.   Colchicine 0.6 MG Caps Take 0.6 mg by mouth 2 (two) times daily as needed (gout flare).   cyanocobalamin 1000 MCG tablet Take 1,500 mcg by mouth daily.   fluticasone 50 MCG/ACT nasal spray Commonly known as:  FLONASE Place 2 sprays into both nostrils daily as needed for allergies.   LEVEMIR FLEXTOUCH 100 UNIT/ML Pen Generic drug:  Insulin Detemir 20 Units daily.   Magnesium Oxide 400 (240 Mg) MG Tabs   sildenafil 100 MG tablet Commonly known as:  VIAGRA   tacrolimus 1 MG capsule Commonly known as:  PROGRAF Take 3 mg by mouth 2 (two) times daily.       Past Surgical History:  He   has a past surgical history that includes lung resection aspergilloma (2000) and Liver transplant (04-2015).  Family History:  His family history includes CAD in his brother; Diabetes in his other; Lung cancer in his father; Pancreatic cancer in his brother; Prostate cancer in his brother.  Social History:  He  reports that he quit smoking about 29 years ago. He has a 10.00 pack-year smoking history. He has never used smokeless tobacco. He reports that he does not drink alcohol or use drugs.

## 2018-04-30 DIAGNOSIS — D899 Disorder involving the immune mechanism, unspecified: Secondary | ICD-10-CM | POA: Diagnosis not present

## 2018-04-30 DIAGNOSIS — Z944 Liver transplant status: Secondary | ICD-10-CM | POA: Diagnosis not present

## 2018-08-07 ENCOUNTER — Encounter: Payer: Medicare Other | Admitting: Internal Medicine

## 2018-08-21 LAB — PULMONARY FUNCTION TEST

## 2018-10-01 ENCOUNTER — Telehealth: Payer: Self-pay

## 2018-10-01 NOTE — Telephone Encounter (Signed)
LMOM informing Pt he is due for OV. Instructed to call office to schedule virtual visit.

## 2018-10-08 ENCOUNTER — Encounter: Payer: Self-pay | Admitting: Gastroenterology

## 2018-11-01 ENCOUNTER — Emergency Department (HOSPITAL_BASED_OUTPATIENT_CLINIC_OR_DEPARTMENT_OTHER): Payer: Medicare Other

## 2018-11-01 ENCOUNTER — Other Ambulatory Visit: Payer: Self-pay

## 2018-11-01 ENCOUNTER — Emergency Department (HOSPITAL_BASED_OUTPATIENT_CLINIC_OR_DEPARTMENT_OTHER)
Admission: EM | Admit: 2018-11-01 | Discharge: 2018-11-01 | Disposition: A | Discharge status: Home / Self Care | Payer: Medicare Other | Attending: Emergency Medicine | Admitting: Emergency Medicine

## 2018-11-01 ENCOUNTER — Encounter (HOSPITAL_BASED_OUTPATIENT_CLINIC_OR_DEPARTMENT_OTHER): Payer: Self-pay | Admitting: *Deleted

## 2018-11-01 DIAGNOSIS — Z87891 Personal history of nicotine dependence: Secondary | ICD-10-CM | POA: Insufficient documentation

## 2018-11-01 DIAGNOSIS — E119 Type 2 diabetes mellitus without complications: Secondary | ICD-10-CM | POA: Diagnosis not present

## 2018-11-01 DIAGNOSIS — K5792 Diverticulitis of intestine, part unspecified, without perforation or abscess without bleeding: Secondary | ICD-10-CM | POA: Diagnosis not present

## 2018-11-01 DIAGNOSIS — Z944 Liver transplant status: Secondary | ICD-10-CM | POA: Diagnosis not present

## 2018-11-01 DIAGNOSIS — Z79899 Other long term (current) drug therapy: Secondary | ICD-10-CM | POA: Insufficient documentation

## 2018-11-01 DIAGNOSIS — R1030 Lower abdominal pain, unspecified: Secondary | ICD-10-CM | POA: Diagnosis present

## 2018-11-01 DIAGNOSIS — Z7984 Long term (current) use of oral hypoglycemic drugs: Secondary | ICD-10-CM | POA: Diagnosis not present

## 2018-11-01 DIAGNOSIS — B1920 Unspecified viral hepatitis C without hepatic coma: Secondary | ICD-10-CM | POA: Diagnosis not present

## 2018-11-01 DIAGNOSIS — D869 Sarcoidosis, unspecified: Secondary | ICD-10-CM | POA: Diagnosis not present

## 2018-11-01 DIAGNOSIS — I10 Essential (primary) hypertension: Secondary | ICD-10-CM | POA: Diagnosis not present

## 2018-11-01 LAB — URINALYSIS, ROUTINE W REFLEX MICROSCOPIC
Bilirubin Urine: NEGATIVE
Glucose, UA: NEGATIVE mg/dL
Ketones, ur: NEGATIVE mg/dL
Leukocytes,Ua: NEGATIVE
Nitrite: NEGATIVE
Protein, ur: 100 mg/dL — AB
Specific Gravity, Urine: 1.015 (ref 1.005–1.030)
pH: 6 (ref 5.0–8.0)

## 2018-11-01 LAB — CBC WITH DIFFERENTIAL/PLATELET
Abs Immature Granulocytes: 0.04 10*3/uL (ref 0.00–0.07)
Basophils Absolute: 0 10*3/uL (ref 0.0–0.1)
Basophils Relative: 0 %
Eosinophils Absolute: 0.1 10*3/uL (ref 0.0–0.5)
Eosinophils Relative: 1 %
HCT: 43.6 % (ref 39.0–52.0)
Hemoglobin: 14.2 g/dL (ref 13.0–17.0)
Immature Granulocytes: 0 %
Lymphocytes Relative: 23 %
Lymphs Abs: 3.1 10*3/uL (ref 0.7–4.0)
MCH: 29.2 pg (ref 26.0–34.0)
MCHC: 32.6 g/dL (ref 30.0–36.0)
MCV: 89.5 fL (ref 80.0–100.0)
Monocytes Absolute: 0.9 10*3/uL (ref 0.1–1.0)
Monocytes Relative: 7 %
Neutro Abs: 9.3 10*3/uL — ABNORMAL HIGH (ref 1.7–7.7)
Neutrophils Relative %: 69 %
Platelets: 246 10*3/uL (ref 150–400)
RBC: 4.87 MIL/uL (ref 4.22–5.81)
RDW: 13.4 % (ref 11.5–15.5)
WBC: 13.5 10*3/uL — ABNORMAL HIGH (ref 4.0–10.5)
nRBC: 0 % (ref 0.0–0.2)

## 2018-11-01 LAB — COMPREHENSIVE METABOLIC PANEL
ALT: 17 U/L (ref 0–44)
AST: 20 U/L (ref 15–41)
Albumin: 4.3 g/dL (ref 3.5–5.0)
Alkaline Phosphatase: 87 U/L (ref 38–126)
Anion gap: 9 (ref 5–15)
BUN: 25 mg/dL — ABNORMAL HIGH (ref 8–23)
CO2: 28 mmol/L (ref 22–32)
Calcium: 9.2 mg/dL (ref 8.9–10.3)
Chloride: 99 mmol/L (ref 98–111)
Creatinine, Ser: 1.16 mg/dL (ref 0.61–1.24)
GFR calc Af Amer: >60 mL/min (ref >60)
GFR calc non Af Amer: >60 mL/min (ref >60)
Glucose, Bld: 149 mg/dL — ABNORMAL HIGH (ref 70–99)
Potassium: 3.5 mmol/L (ref 3.5–5.1)
Sodium: 136 mmol/L (ref 135–145)
Total Bilirubin: 0.7 mg/dL (ref 0.3–1.2)
Total Protein: 8.7 g/dL — ABNORMAL HIGH (ref 6.5–8.1)

## 2018-11-01 LAB — URINALYSIS, MICROSCOPIC (REFLEX): WBC, UA: NONE SEEN WBC/hpf (ref 0–5)

## 2018-11-01 LAB — LIPASE, BLOOD: Lipase: 35 U/L (ref 11–51)

## 2018-11-01 MED ORDER — IOHEXOL 300 MG/ML  SOLN
100.0000 mL | Freq: Once | INTRAMUSCULAR | Status: AC | PRN
  Administered 2018-11-01: 100 mL via INTRAVENOUS

## 2018-11-01 MED ORDER — AMOXICILLIN-POT CLAVULANATE 875-125 MG PO TABS
1.0000 | ORAL_TABLET | Freq: Once | ORAL | Status: AC
  Administered 2018-11-01: 22:00:00 1 via ORAL
  Filled 2018-11-01: qty 1

## 2018-11-01 MED ORDER — AMOXICILLIN-POT CLAVULANATE 875-125 MG PO TABS: 1.0000 | ORAL_TABLET | Freq: Two times a day (BID) | ORAL | 0 refills | Status: AC

## 2018-11-01 MED ORDER — OXYCODONE HCL 5 MG PO TABS
5.0000 mg | ORAL_TABLET | Freq: Once | ORAL | Status: AC
  Administered 2018-11-01: 22:00:00 5 mg via ORAL
  Filled 2018-11-01: qty 1

## 2018-11-01 MED ORDER — ONDANSETRON HCL 4 MG PO TABS: 4.0000 mg | ORAL_TABLET | Freq: Four times a day (QID) | ORAL | 0 refills | Status: AC

## 2018-11-01 MED ORDER — ONDANSETRON 4 MG PO TBDP
4.0000 mg | ORAL_TABLET | Freq: Once | ORAL | Status: AC
  Administered 2018-11-01: 22:00:00 4 mg via ORAL
  Filled 2018-11-01: qty 1

## 2018-11-01 NOTE — ED Notes (Signed)
Patient went to CT

## 2018-11-01 NOTE — ED Provider Notes (Signed)
[facility] EMERGENCY DEPARTMENT Provider Note   CSN: [id number] Arrival date & time: 11/01/18  1815    History   Chief Complaint Chief Complaint  Patient presents with   Abdominal Pain    HPI ERSEL ENSLIN is a 63 y.o. male.     The history is provided by the patient.  Abdominal Pain  Pain location:  Suprapubic Pain quality: aching and cramping   Pain radiates to:  Does not radiate Pain severity:  Mild Onset quality:  Gradual Timing:  Intermittent Progression:  Waxing and waning Chronicity:  New Context: previous surgery   Relieved by:  Nothing Worsened by:  Nothing Associated symptoms: constipation and nausea   Associated symptoms: no anorexia, no chest pain, no chills, no cough, no dysuria, no fatigue, no fever, no flatus, no hematuria, no shortness of breath, no sore throat and no vomiting   Risk factors: multiple surgeries     Past Medical History:  Diagnosis Date   Benign prostatic hypertrophy    Cirrhosis ([place])    Diabetes mellitus    initially 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 but then the diabetes resurface without the use of steroids   HCC (hepatocellular carcinoma) ([place])    s/p TACE and microwave ablation   Hep C w/o coma, chronic (HCC)    bx aprox 12-10, s/p Harvoni, on a transplant list candidate    History of sarcoidosis    used steroids x 10 years   Hyperlipidemia    Hypertension    OSA (obstructive sleep apnea) 01/15/2018   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   Uveitis    h/o    Patient Active Problem List   Diagnosis Date Noted   OSA (obstructive sleep apnea) 01/15/2018   Angioedema 11/28/2016   PCP NOTES >>>>>>> 03/30/2015   Increased prostate specific antigen (PSA) velocity 11/26/2014   Gout 04/28/2014   Syncope ? 05/21/2013   Shoulder pain 12/05/2011   Erectile dysfunction 07/18/2011   Annual physical exam 01/12/2011   Hepatitis C ---cirrhosis --- s/p HARVONI-- hapatocellular cancer 04/16/2007   UVEITIS  04/16/2007   BENIGN PROSTATIC HYPERTROPHY 04/16/2007   UROLITHIASIS, HX OF 04/16/2007   SARCOIDOSIS 09/26/2006   DM II (diabetes mellitus, type II), controlled ([place]) 09/26/2006   Hyperlipidemia 09/26/2006   Essential hypertension 09/26/2006   OSTEOPOROSIS 09/26/2006    Past Surgical History:  Procedure Laterality Date   LIVER TRANSPLANT  04-2015   lung resection aspergilloma  2000        Home Medications    Prior to Admission medications   Medication Sig Start Date End Date Taking? Authorizing Provider  allopurinol (ZYLOPRIM) 300 MG tablet  08/27/17  Yes [provider]  amLODipine (NORVASC) 10 MG tablet Take 1 tablet (10 mg total) by mouth daily. 08/07/17  Yes Paz, Alda Berthold, MD  ascorbic acid (VITAMIN C) 500 MG tablet Take 500 mg by mouth daily.   Yes [provider]  atorvastatin (LIPITOR) 10 MG tablet Take 5 mg by mouth daily.   Yes [provider]  cetirizine (ZYRTEC) 10 MG tablet Take 10 mg by mouth daily.   Yes [provider]  cholecalciferol (VITAMIN D) 1000 units tablet Take 1,500 Units by mouth daily.   Yes [provider]  Colchicine 0.6 MG CAPS Take 0.6 mg by mouth 2 (two) times daily as needed (gout flare). Patient taking differently: Take 0.6 mg by mouth daily.  10/09/17  Yes Colon Branch, MD  cyanocobalamin 1000 MCG tablet Take 1,500  mcg by mouth daily.   Yes [provider]  hydrochlorothiazide (HYDRODIURIL) 12.5 MG tablet Take 12.5 mg by mouth daily.   Yes [provider]  Magnesium Oxide 400 (240 Mg) MG TABS  05/09/17  Yes [provider]  metFORMIN (GLUCOPHAGE-XR) 500 MG 24 hr tablet  07/29/18  Yes [provider]  sildenafil (VIAGRA) 100 MG tablet  06/24/17  Yes [provider]  tacrolimus (PROGRAF) 1 MG capsule Take 3 mg by mouth 2 (two) times daily.   Yes [provider]  amoxicillin-clavulanate (AUGMENTIN) 875-125 MG tablet Take 1 tablet by mouth 2 (two) times  daily for 10 days. 11/02/18 11/12/18  Caleah Tortorelli, DO  fluticasone (FLONASE) 50 MCG/ACT nasal spray Place 2 sprays into both nostrils daily as needed for allergies.  01/27/16   [provider]  LEVEMIR FLEXTOUCH 100 UNIT/ML Pen 20 Units daily.  04/12/15   [provider]  ondansetron (ZOFRAN) 4 MG tablet Take 1 tablet (4 mg total) by mouth every 6 (six) hours for 12 doses. 11/01/18 11/04/18  Lennice Sites, DO    Family History Family History  Problem Relation Age of Onset   Prostate cancer Brother        at age 6s   Pancreatic cancer Brother    CAD Brother    Lung cancer Father    Diabetes Other        father side    Colon cancer Neg Hx     Social History Social History   Tobacco Use   Smoking status: Former Smoker    Packs/day: 1.00    Years: 10.00    Pack years: 10.00    Last attempt to quit: [place]    Years since quitting: 30.4   Smokeless tobacco: Never Used   Tobacco comment: remote smoker  Substance Use Topics   Alcohol use: No   Drug use: No     Allergies   Benazepril hcl; Erythromycin ethylsuccinate; and Keflex [cephalexin]   Review of Systems Review of Systems  Constitutional: Negative for chills, fatigue and fever.  HENT: Negative for ear pain and sore throat.   Eyes: Negative for pain and visual disturbance.  Respiratory: Negative for cough and shortness of breath.   Cardiovascular: Negative for chest pain and palpitations.  Gastrointestinal: Positive for abdominal pain, constipation and nausea. Negative for anorexia, flatus and vomiting.  Genitourinary: Negative for dysuria and hematuria.  Musculoskeletal: Negative for arthralgias and back pain.  Skin: Negative for color change and rash.  Neurological: Negative for seizures and syncope.  All other systems reviewed and are negative.    Physical Exam Updated Vital Signs  ED Triage Vitals  Enc Vitals Group     BP 11/01/18 1829 (!) 155/80     Pulse Rate 11/01/18 1829 72      Resp 11/01/18 1829 20     Temp 11/01/18 1829 98.3 F (36.8 C)     Temp Source 11/01/18 1829 Oral     SpO2 11/01/18 1829 99 %     Weight 11/01/18 1824 161 lb 8 oz (73.3 kg)     Height 11/01/18 1824 5' 7.5" (1.715 m)     Head Circumference --      Peak Flow --      Pain Score 11/01/18 1856 5     Pain Loc --      Pain Edu? --      Excl. in [place]? --     Physical Exam Vitals signs and nursing note  reviewed.  Constitutional:      General: He is not in acute distress.    Appearance: He is well-developed. He is not ill-appearing.  HENT:     Head: Normocephalic and atraumatic.     Mouth/Throat:     Mouth: Mucous membranes are moist.  Eyes:     Extraocular Movements: Extraocular movements intact.     Conjunctiva/sclera: Conjunctivae normal.     Pupils: Pupils are equal, round, and reactive to light.  Neck:     Musculoskeletal: Neck supple.  Cardiovascular:     Rate and Rhythm: Normal rate and regular rhythm.     Heart sounds: Normal heart sounds. No murmur.  Pulmonary:     Effort: Pulmonary effort is normal. No respiratory distress.     Breath sounds: Normal breath sounds.  Abdominal:     General: Abdomen is flat. A surgical scar is present.     Palpations: Abdomen is soft.     Tenderness: There is abdominal tenderness in the periumbilical area and suprapubic area. There is no right CVA tenderness, left CVA tenderness, guarding or rebound. Negative signs include Murphy's sign and Rovsing's sign.  Skin:    General: Skin is warm and dry.     Capillary Refill: Capillary refill takes less than 2 seconds.  Neurological:     General: No focal deficit present.     Mental Status: He is alert.  Psychiatric:        Mood and Affect: Mood normal.      ED Treatments / Results  Labs (all labs ordered are listed, but only abnormal results are displayed) Labs Reviewed  URINALYSIS, ROUTINE W REFLEX MICROSCOPIC - Abnormal; Notable for the following components:      Result Value   Hgb  urine dipstick SMALL (*)    Protein, ur 100 (*)    All other components within normal limits  CBC WITH DIFFERENTIAL/PLATELET - Abnormal; Notable for the following components:   WBC 13.5 (*)    Neutro Abs 9.3 (*)    All other components within normal limits  COMPREHENSIVE METABOLIC PANEL - Abnormal; Notable for the following components:   Glucose, Bld 149 (*)    BUN 25 (*)    Total Protein 8.7 (*)    All other components within normal limits  URINALYSIS, MICROSCOPIC (REFLEX) - Abnormal; Notable for the following components:   Bacteria, UA RARE (*)    All other components within normal limits  LIPASE, BLOOD    EKG None  Radiology Ct Abdomen Pelvis W Contrast  Result Date: 11/01/2018 CLINICAL DATA:  Sarcoidosis, hepatitis-C.  Liver transplant. EXAM: CT ABDOMEN AND PELVIS WITH CONTRAST TECHNIQUE: Multidetector CT imaging of the abdomen and pelvis was performed using the standard protocol following bolus administration of intravenous contrast. CONTRAST:  134mL OMNIPAQUE IOHEXOL 300 MG/ML  SOLN COMPARISON:  CT 03/08/2017 FINDINGS: Lower chest: Bronchiectasis in the LEFT lower lobe. Interstitial thickening in the RIGHT lung base with mild nodularity Hepatobiliary: No focal hepatic lesion.  No biliary duct dilatation. Pancreas: Pancreas is normal. No ductal dilatation. No pancreatic inflammation. Spleen: Normal spleen Adrenals/urinary tract: Adrenal glands and kidneys are normal. The ureters and bladder normal. Stomach/Bowel: Stomach, small bowel, and cecum are normal. Ascending and transverse colon normal. There is thickening of the proximal sigmoid colon with circumferential submucosal edema. Mild pericolonic inflammation in the adjacent mesocolon. There are several diverticula through this region. Rectum normal. These findings of bowel inflammation are evident on image 57/2. No perforation or abscess. Vascular/Lymphatic: Abdominal  aorta is normal caliber with atherosclerotic calcification. There  is no retroperitoneal or periportal lymphadenopathy. No pelvic lymphadenopathy. Retro aortic LEFT renal vein. Reproductive: Prostate normal Other: No free fluid. Musculoskeletal: No aggressive osseous lesion. IMPRESSION: 1. Segment of bowel edema pericolonic inflammation involving the proximal sigmoid colon is favored mild to moderate acute diverticulitis. Consider follow-up imaging to demonstrate resolution. 2. No hepatic abnormality. Electronically Signed   By: Suzy Bouchard M.D.   On: 11/01/2018 20:42    Procedures Procedures (including critical care time)  Medications Ordered in ED Medications  oxyCODONE (Oxy IR/ROXICODONE) immediate release tablet 5 mg (has no administration in time range)  ondansetron (ZOFRAN-ODT) disintegrating tablet 4 mg (has no administration in time range)  amoxicillin-clavulanate (AUGMENTIN) 875-125 MG per tablet 1 tablet (has no administration in time range)  iohexol (OMNIPAQUE) 300 MG/ML solution 100 mL (100 mLs Intravenous Contrast Given 11/01/18 2016)     Initial Impression / Assessment and Plan / ED Course  I have reviewed the triage vital signs and the nursing notes.  Pertinent labs & imaging results that were available during my care of the patient were reviewed by me and considered in my medical decision making (see chart for details).     ROBIE MCNIEL is a 63 year old male with history of hypertension, high cholesterol, hepatitis status post liver transplant who presents to the ED with left lower quadrant abdominal pain.  Patient denies any urinary symptoms.  Patient has a history of diverticulitis.  Mostly tender in the left lower quadrant.  CT scan shows mild acute diverticulitis.  Has mild leukocytosis.  Otherwise lab work is unremarkable.  Felt better after Zofran and Norco.  Will prescribe Augmentin.  Recommend that he closely follow-up with his GI doctor.  Told him return to the ED if his symptoms worsen, develops a fever.  Urinalysis was also  negative.  He understands return precautions and was discharged from ED in good condition.  Patient remained hemodynamically stable and overall well-appearing throughout my stay.  This chart was dictated using voice recognition software.  Despite best efforts to proofread,  errors can occur which can change the documentation meaning.    Final Clinical Impressions(s) / ED Diagnoses   Final diagnoses:  Acute diverticulitis    ED Discharge Orders         Ordered    amoxicillin-clavulanate (AUGMENTIN) 875-125 MG tablet  2 times daily     11/01/18 2135    ondansetron (ZOFRAN) 4 MG tablet  Every 6 hours     11/01/18 2135           Lennice Sites, DO 11/01/18 2135

## 2018-11-01 NOTE — Discharge Instructions (Signed)
Follow up with GI next week. Return if dont feeling better.

## 2018-11-01 NOTE — ED Triage Notes (Signed)
Abdominal pain in his lower abdomen that woke him at 4am. Urgency. Denies dysuria.

## 2018-12-30 ENCOUNTER — Encounter: Payer: Self-pay | Admitting: Internal Medicine

## 2019-04-09 DIAGNOSIS — Z944 Liver transplant status: Secondary | ICD-10-CM | POA: Diagnosis not present

## 2019-04-09 DIAGNOSIS — D899 Disorder involving the immune mechanism, unspecified: Secondary | ICD-10-CM | POA: Diagnosis not present

## 2019-05-08 NOTE — Progress Notes (Signed)
Virtual Visit via Video Note  I connected with patient on 05/09/19 at  2:30 PM EST by audio enabled telemedicine application and verified that I am speaking with the correct person using two identifiers.   THIS ENCOUNTER IS A VIRTUAL VISIT DUE TO COVID-19 - PATIENT WAS NOT SEEN IN THE OFFICE. PATIENT HAS CONSENTED TO VIRTUAL VISIT / TELEMEDICINE VISIT   Location of patient: home  Location of provider: office  I discussed the limitations of evaluation and management by telemedicine and the availability of in person appointments. The patient expressed understanding and agreed to proceed.   Subjective:   ARIAS UPLINGER is a 63 y.o. male who presents for Medicare Annual/Subsequent preventive examination.  Pt is retired Nature conservation officer. Enjoys light exercise and church. Mr.Girouard is being very careful during pandemic b/c he is a transplant pt.  Review of Systems:  Home Safety/Smoke Alarms: Feels safe in home. Smoke alarms in place.  Lives w/wife and 53 yr old dog. 1 story home.   Male:   CCS- pt reports last done 2017-normal.   PSA-  Lab Results  Component Value Date   PSA 1.04 03/30/2015   PSA 3.41 07/23/2014   PSA 1.38 01/20/2013       Objective:    Vitals:  Unable to assess. This visit is enabled though telemedicine due to Covid 19.    Advanced Directives 05/09/2019 11/01/2018 11/29/2016 05/17/2016  Does Patient Have a Medical Advance Directive? Yes No Yes No  Type of Paramedic of [place];Living will - Living will -  Does patient want to make changes to medical advance directive? No - Patient declined - No - Patient declined -  Copy of [place] in Chart? No - copy requested - - -  Would patient like information on creating a medical advance directive? - - - Yes (MAU/Ambulatory/Procedural Areas - Information given)    Tobacco Social History   Tobacco Use  Smoking Status Former Smoker  . Packs/day: 1.00  . Years: 10.00  . Pack  years: 10.00  . Quit date: 63  . Years since quitting: 30.9  Smokeless Tobacco Never Used  Tobacco Comment   remote smoker     Counseling given: Not Answered Comment: remote smoker   Clinical Intake: Pain : No/denies pain     Past Medical History:  Diagnosis Date  . Benign prostatic hypertrophy   . Cirrhosis ([place])   . Diabetes mellitus    initially 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 but then the diabetes resurface without the use of steroids  . [place] (hepatocellular carcinoma) ([place])    s/p TACE and microwave ablation  . Hep C w/o coma, chronic (HCC)    bx aprox 12-10, s/p Harvoni, on a transplant list candidate   . History of sarcoidosis    used steroids x 10 years  . Hyperlipidemia   . Hypertension   . OSA (obstructive sleep apnea) 01/15/2018  . 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 Uveitis    h/o   Past Surgical History:  Procedure Laterality Date  . LIVER TRANSPLANT  04-2015  . lung resection aspergilloma  2000   Family History  Problem Relation Age of Onset  . Prostate cancer Brother        at age 34s  . Pancreatic cancer Brother   . CAD Brother   . Lung cancer Father   . Diabetes Other        father side   . Colon cancer Neg Hx  Social History   Socioeconomic History  . Marital status: Married    Spouse name: Not on file  . Number of children: 0  . Years of education: Not on file  . Highest education level: Not on file  Occupational History  . Occupation: retired Wellsite geologist  . Financial resource strain: Not on file  . Food insecurity    Worry: Not on file    Inability: Not on file  . Transportation needs    Medical: Not on file    Non-medical: Not on file  Tobacco Use  . Smoking status: Former Smoker    Packs/day: 1.00    Years: 10.00    Pack years: 10.00    Quit date: 1990    Years since quitting: 30.9  . Smokeless tobacco: Never Used  . Tobacco comment: remote smoker  Substance and Sexual Activity  . Alcohol use: No  . Drug use:  No  . Sexual activity: Yes  Lifestyle  . Physical activity    Days per week: Not on file    Minutes per session: Not on file  . Stress: Not on file  Relationships  . Social Herbalist on phone: Not on file    Gets together: Not on file    Attends religious service: Not on file    Active member of club or organization: Not on file    Attends meetings of clubs or organizations: Not on file    Relationship status: Not on file  Other Topics Concern  . Not on file  Social History Narrative   Mother- Lance Bosch one of my patients, deceased ~ 01-Sep-2011   Married, No children     Outpatient Encounter Medications as of 05/09/2019  Medication Sig  . allopurinol (ZYLOPRIM) 300 MG tablet   . amLODipine (NORVASC) 10 MG tablet Take 1 tablet (10 mg total) by mouth daily.  Marland Kitchen ascorbic acid (VITAMIN C) 500 MG tablet Take 500 mg by mouth daily.  Marland Kitchen atorvastatin (LIPITOR) 10 MG tablet Take 5 mg by mouth daily. Takes 4x/ week  . cetirizine (ZYRTEC) 10 MG tablet Take 10 mg by mouth daily.  . cholecalciferol (VITAMIN D) 1000 units tablet Take 1,500 Units by mouth daily.  . Colchicine 0.6 MG CAPS Take 0.6 mg by mouth 2 (two) times daily as needed (gout flare). (Patient taking differently: Take 0.6 mg by mouth daily. )  . cyanocobalamin 1000 MCG tablet Take 1,500 mcg by mouth daily.  . fluticasone (FLONASE) 50 MCG/ACT nasal spray Place 2 sprays into both nostrils daily as needed for allergies.   . hydrochlorothiazide (HYDRODIURIL) 12.5 MG tablet Take 12.5 mg by mouth daily.  Marland Kitchen LEVEMIR FLEXTOUCH 100 UNIT/ML Pen 15 Units daily.   . Magnesium Oxide 400 (240 Mg) MG TABS   . Omega-3 Fatty Acids (FISH OIL PO) Take by mouth.  . sildenafil (VIAGRA) 100 MG tablet   . tacrolimus (PROGRAF) 1 MG capsule Take 3 mg by mouth 2 (two) times daily.  . [DISCONTINUED] metFORMIN (GLUCOPHAGE-XR) 500 MG 24 hr tablet    No facility-administered encounter medications on file as of 05/09/2019.     Activities of Daily  Living In your present state of health, do you have any difficulty performing the following activities: 05/09/2019  Hearing? N  Vision? N  Difficulty concentrating or making decisions? N  Walking or climbing stairs? N  Dressing or bathing? N  Doing errands, shopping? N  Preparing Food and eating ? N  Using the Toilet? N  In the past six months, have you accidently leaked urine? N  Do you have problems with loss of bowel control? N  Managing your Medications? N  Managing your Finances? N  Housekeeping or managing your Housekeeping? N  Some recent data might be hidden    Patient Care Team: Colon Branch, MD as PCP - General Drazek, Dawn, CRNP as Nurse Practitioner (Nurse Practitioner) Claudia Pollock, Vianne Bulls, MD as Referring Physician (Internal Medicine) Zamor, Ander Gaster, MD as Attending Physician (Internal Medicine) Genia Del, MD (Internal Medicine) Renato Shin, MD as Consulting Physician (Endocrinology)   Assessment:   This is a routine wellness examination for [place]. Physical assessment deferred to PCP.  Exercise Activities and Dietary recommendations Current Exercise Habits: Home exercise routine, Type of exercise: yoga;walking, Time (Minutes): 30, Frequency (Times/Week): 7, Weekly Exercise (Minutes/Week): 210, Intensity: Mild, Exercise limited by: None identified   Diet (meal preparation, eat out, water intake, caffeinated beverages, dairy products, fruits and vegetables): in general, a "healthy" diet  , well balanced      Goals    . Read the bible front to back and grow spiritually.       Fall Risk Fall Risk  05/09/2019 05/17/2016 12/13/2015 09/16/2015 05/21/2013  Falls in the past year? 0 No No No No    Depression Screen PHQ 2/9 Scores 08/02/2017 05/17/2016 12/13/2015 09/16/2015  PHQ - 2 Score 0 0 0 0     Cognitive Function Ad8 score reviewed for issues:  Issues making decisions:no  Less interest in hobbies / activities:no  Repeats questions, stories (family  complaining):no  Trouble using ordinary gadgets (microwave, computer, phone):no  Forgets the month or year: no  Mismanaging finances: no  Remembering appts:no  Daily problems with thinking and/or memory:no Ad8 score is=0   MMSE - Mini Mental State Exam 05/17/2016  Orientation to time 5  Orientation to Place 5  Registration 3  Attention/ Calculation 5  Recall 3  Language- name 2 objects 2  Language- repeat 1  Language- follow 3 step command 3  Language- read & follow direction 1  Write a sentence 1  Copy design 1  Total score 30        Immunization History  Administered Date(s) Administered  . H1N1 06/23/2008  . Hep A / Hep B 05/26/2013  . Influenza Split 05/05/2011  . Influenza Whole 04/16/2007, 03/09/2009, 04/05/2010  . Influenza, Seasonal, Injecte, Preservative Fre 04/30/2013  . Influenza,inj,Quad PF,6+ Mos 03/25/2018  . Influenza-Unspecified 02/04/2015, 02/04/2016  . Pneumococcal Conjugate-13 07/23/2014  . Pneumococcal Polysaccharide-23 03/05/2006, 05/21/2013  . Td 06/05/2002  . Tdap 05/21/2013  . Zoster 11/04/2014   Screening Tests Health Maintenance  Topic Date Due  . HIV Screening  05[birth year]11/1971  . FOOT EXAM  09/15/2016  . HEMOGLOBIN A1C  01/15/2018  . OPHTHALMOLOGY EXAM  03/05/2018  . INFLUENZA VACCINE  01/04/2019  . TETANUS/TDAP  05/22/2023  . COLONOSCOPY  07/13/2027  . Hepatitis C Screening  Completed         Plan:   See you next year!  Continue to eat heart healthy diet (full of fruits, vegetables, whole grains, lean protein, water--limit salt, fat, and sugar intake) and increase physical activity as tolerated.  Continue doing brain stimulating activities (puzzles, reading, adult coloring books, staying active) to keep memory sharp.   Bring a copy of your living will and/or healthcare power of attorney to your next office visit.    I have personally reviewed and noted the following in  the patient's chart:   . Medical and social history  . Use of alcohol, tobacco or illicit drugs  . Current medications and supplements . Functional ability and status . Nutritional status . Physical activity . Advanced directives . List of other physicians . Hospitalizations, surgeries, and ER visits in previous 12 months . Vitals . Screenings to include cognitive, depression, and falls . Referrals and appointments  In addition, I have reviewed and discussed with patient certain preventive protocols, quality metrics, and best practice recommendations. A written personalized care plan for preventive services as well as general preventive health recommendations were provided to patient.     Shela Nevin, [place]  05/09/2019

## 2019-05-09 ENCOUNTER — Other Ambulatory Visit: Payer: Self-pay

## 2019-05-09 ENCOUNTER — Ambulatory Visit (INDEPENDENT_AMBULATORY_CARE_PROVIDER_SITE_OTHER): Payer: Medicare Other | Admitting: *Deleted

## 2019-05-09 ENCOUNTER — Encounter: Payer: Self-pay | Admitting: *Deleted

## 2019-05-09 DIAGNOSIS — Z0000 Encounter for general adult medical examination without abnormal findings: Secondary | ICD-10-CM

## 2019-05-09 NOTE — Patient Instructions (Signed)
See you next year!  Continue to eat heart healthy diet (full of fruits, vegetables, whole grains, lean protein, water--limit salt, fat, and sugar intake) and increase physical activity as tolerated.  Continue doing brain stimulating activities (puzzles, reading, adult coloring books, staying active) to keep memory sharp.   Bring a copy of your living will and/or healthcare power of attorney to your next office visit.   Mr. Jeffery York , Thank you for taking time to come for your Medicare Wellness Visit. I appreciate your ongoing commitment to your health goals. Please review the following plan we discussed and let me know if I can assist you in the future.    This is a list of the screening recommended for you and due dates:  Health Maintenance  Topic Date Due  . HIV Screening  05[birth year]11/1971  . Complete foot exam   09/15/2016  . Hemoglobin A1C  01/15/2018  . Eye exam for diabetics  03/05/2018  . Flu Shot  01/04/2019  . Tetanus Vaccine  05/22/2023  . Colon Cancer Screening  07/13/2027  .  Hepatitis C: One time screening is recommended by Center for Disease Control  (CDC) for  adults born from [birth year] through 1967.   Completed    Preventive Care 108-26 Years Old, Male Preventive care refers to lifestyle choices and visits with your health care provider that can promote health and wellness. This includes:  A yearly physical exam. This is also called an annual well check.  Regular dental and eye exams.  Immunizations.  Screening for certain conditions.  Healthy lifestyle choices, such as eating a healthy diet, getting regular exercise, not using drugs or products that contain nicotine and tobacco, and limiting alcohol use. What can I expect for my preventive care visit? Physical exam Your health care provider will check:  Height and weight. These may be used to calculate body mass index (BMI), which is a measurement that tells if you are at a healthy weight.  Heart rate and blood pressure.   Your skin for abnormal spots. Counseling Your health care provider may ask you questions about:  Alcohol, tobacco, and drug use.  Emotional well-being.  Home and relationship well-being.  Sexual activity.  Eating habits.  Work and work Statistician. What immunizations do I need?  Influenza (flu) vaccine  This is recommended every year. Tetanus, diphtheria, and pertussis (Tdap) vaccine  You may need a Td booster every 10 years. Varicella (chickenpox) vaccine  You may need this vaccine if you have not already been vaccinated. Zoster (shingles) vaccine  You may need this after age 2. Measles, mumps, and rubella (MMR) vaccine  You may need at least one dose of MMR if you were born in 1957 or later. You may also need a second dose. Pneumococcal conjugate (PCV13) vaccine  You may need this if you have certain conditions and were not previously vaccinated. Pneumococcal polysaccharide (PPSV23) vaccine  You may need one or two doses if you smoke cigarettes or if you have certain conditions. Meningococcal conjugate (MenACWY) vaccine  You may need this if you have certain conditions. Hepatitis A vaccine  You may need this if you have certain conditions or if you travel or work in places where you may be exposed to hepatitis A. Hepatitis B vaccine  You may need this if you have certain conditions or if you travel or work in places where you may be exposed to hepatitis B. Haemophilus influenzae type b (Hib) vaccine  You may need this if you  have certain risk factors. Human papillomavirus (HPV) vaccine  If recommended by your health care provider, you may need three doses over 6 months. You may receive vaccines as individual doses or as more than one vaccine together in one shot (combination vaccines). Talk with your health care provider about the risks and benefits of combination vaccines. What tests do I need? Blood tests  Lipid and cholesterol levels. These may be  checked every 5 years, or more frequently if you are over 35 years old.  Hepatitis C test.  Hepatitis B test. Screening  Lung cancer screening. You may have this screening every year starting at age 49 if you have a 30-pack-year history of smoking and currently smoke or have quit within the past 15 years.  Prostate cancer screening. Recommendations will vary depending on your family history and other risks.  Colorectal cancer screening. All adults should have this screening starting at age 25 and continuing until age 10. Your health care provider may recommend screening at age 92 if you are at increased risk. You will have tests every 1-10 years, depending on your results and the type of screening test.  Diabetes screening. This is done by checking your blood sugar (glucose) after you have not eaten for a while (fasting). You may have this done every 1-3 years.  Sexually transmitted disease (STD) testing. Follow these instructions at home: Eating and drinking  Eat a diet that includes fresh fruits and vegetables, whole grains, lean protein, and low-fat dairy products.  Take vitamin and mineral supplements as recommended by your health care provider.  Do not drink alcohol if your health care provider tells you not to drink.  If you drink alcohol: ? Limit how much you have to 0-2 drinks a day. ? Be aware of how much alcohol is in your drink. In the [place] one drink equals one 12 oz bottle of beer (355 mL), one 5 oz glass of wine (148 mL), or one 1 oz glass of hard liquor (44 mL). Lifestyle  Take daily care of your teeth and gums.  Stay active. Exercise for at least 30 minutes on 5 or more days each week.  Do not use any products that contain nicotine or tobacco, such as cigarettes, e-cigarettes, and chewing tobacco. If you need help quitting, ask your health care provider.  If you are sexually active, practice safe sex. Use a condom or other form of protection to prevent STIs  (sexually transmitted infections).  Talk with your health care provider about taking a low-dose aspirin every day starting at age 38. What's next?  Go to your health care provider once a year for a well check visit.  Ask your health care provider how often you should have your eyes and teeth checked.  Stay up to date on all vaccines. This information is not intended to replace advice given to you by your health care provider. Make sure you discuss any questions you have with your health care provider. Document Released: 06/18/2015 Document Revised: 05/16/2018 Document Reviewed: 05/16/2018 Elsevier Patient Education  2020 [employer].

## 2019-05-12 ENCOUNTER — Other Ambulatory Visit: Payer: Self-pay

## 2019-05-13 ENCOUNTER — Ambulatory Visit: Payer: Medicare Other | Admitting: Internal Medicine

## 2020-01-09 ENCOUNTER — Other Ambulatory Visit: Payer: Self-pay

## 2020-01-09 ENCOUNTER — Observation Stay (HOSPITAL_COMMUNITY)
Admission: EM | Admit: 2020-01-09 | Discharge: 2020-01-11 | Disposition: A | Discharge status: Home / Self Care | Payer: Medicare Other | Attending: Internal Medicine | Admitting: Internal Medicine

## 2020-01-09 ENCOUNTER — Encounter (HOSPITAL_COMMUNITY): Payer: Self-pay

## 2020-01-09 DIAGNOSIS — Z794 Long term (current) use of insulin: Secondary | ICD-10-CM | POA: Insufficient documentation

## 2020-01-09 DIAGNOSIS — J849 Interstitial pulmonary disease, unspecified: Secondary | ICD-10-CM | POA: Diagnosis not present

## 2020-01-09 DIAGNOSIS — Z20822 Contact with and (suspected) exposure to covid-19: Secondary | ICD-10-CM | POA: Insufficient documentation

## 2020-01-09 DIAGNOSIS — Z944 Liver transplant status: Secondary | ICD-10-CM | POA: Insufficient documentation

## 2020-01-09 DIAGNOSIS — Z902 Acquired absence of lung [part of]: Secondary | ICD-10-CM | POA: Insufficient documentation

## 2020-01-09 DIAGNOSIS — Z7901 Long term (current) use of anticoagulants: Secondary | ICD-10-CM | POA: Diagnosis not present

## 2020-01-09 DIAGNOSIS — R918 Other nonspecific abnormal finding of lung field: Secondary | ICD-10-CM | POA: Insufficient documentation

## 2020-01-09 DIAGNOSIS — I2699 Other pulmonary embolism without acute cor pulmonale: Principal | ICD-10-CM | POA: Insufficient documentation

## 2020-01-09 DIAGNOSIS — E785 Hyperlipidemia, unspecified: Secondary | ICD-10-CM | POA: Diagnosis not present

## 2020-01-09 DIAGNOSIS — R042 Hemoptysis: Secondary | ICD-10-CM | POA: Diagnosis not present

## 2020-01-09 DIAGNOSIS — M109 Gout, unspecified: Secondary | ICD-10-CM | POA: Insufficient documentation

## 2020-01-09 DIAGNOSIS — I2694 Multiple subsegmental pulmonary emboli without acute cor pulmonale: Secondary | ICD-10-CM | POA: Diagnosis not present

## 2020-01-09 DIAGNOSIS — I10 Essential (primary) hypertension: Secondary | ICD-10-CM | POA: Insufficient documentation

## 2020-01-09 DIAGNOSIS — J984 Other disorders of lung: Secondary | ICD-10-CM | POA: Diagnosis not present

## 2020-01-09 DIAGNOSIS — E119 Type 2 diabetes mellitus without complications: Secondary | ICD-10-CM | POA: Diagnosis not present

## 2020-01-09 DIAGNOSIS — Z8709 Personal history of other diseases of the respiratory system: Secondary | ICD-10-CM | POA: Insufficient documentation

## 2020-01-09 LAB — COMPREHENSIVE METABOLIC PANEL
ALT: 26 U/L (ref 0–44)
AST: 23 U/L (ref 15–41)
Albumin: 3.6 g/dL (ref 3.5–5.0)
Alkaline Phosphatase: 83 U/L (ref 38–126)
Anion gap: 8 (ref 5–15)
BUN: 22 mg/dL (ref 8–23)
CO2: 26 mmol/L (ref 22–32)
Calcium: 8.9 mg/dL (ref 8.9–10.3)
Chloride: 104 mmol/L (ref 98–111)
Creatinine, Ser: 1.1 mg/dL (ref 0.61–1.24)
GFR calc Af Amer: >60 mL/min (ref >60)
GFR calc non Af Amer: >60 mL/min (ref >60)
Glucose, Bld: 122 mg/dL — ABNORMAL HIGH (ref 70–99)
Potassium: 3.8 mmol/L (ref 3.5–5.1)
Sodium: 138 mmol/L (ref 135–145)
Total Bilirubin: 1.2 mg/dL (ref 0.3–1.2)
Total Protein: 7.3 g/dL (ref 6.5–8.1)

## 2020-01-09 NOTE — ED Triage Notes (Addendum)
Pt arrives to ED w/ c/o coughing up blood over the last 2 weeks. Pt denies chest pain, sob, fever. Pt has hx of liver transplant 5 years ago, pt states he feels he can feel "gugling" in R lung. Pt has extensive medical hx.

## 2020-01-09 NOTE — ED Notes (Signed)
Called to update vitals no answer  

## 2020-01-10 ENCOUNTER — Emergency Department (HOSPITAL_COMMUNITY): Payer: Medicare Other

## 2020-01-10 DIAGNOSIS — I2694 Multiple subsegmental pulmonary emboli without acute cor pulmonale: Secondary | ICD-10-CM | POA: Diagnosis not present

## 2020-01-10 DIAGNOSIS — J984 Other disorders of lung: Secondary | ICD-10-CM | POA: Diagnosis not present

## 2020-01-10 DIAGNOSIS — I2699 Other pulmonary embolism without acute cor pulmonale: Secondary | ICD-10-CM | POA: Diagnosis present

## 2020-01-10 DIAGNOSIS — R042 Hemoptysis: Secondary | ICD-10-CM | POA: Diagnosis not present

## 2020-01-10 DIAGNOSIS — J849 Interstitial pulmonary disease, unspecified: Secondary | ICD-10-CM | POA: Diagnosis not present

## 2020-01-10 DIAGNOSIS — R918 Other nonspecific abnormal finding of lung field: Secondary | ICD-10-CM | POA: Diagnosis not present

## 2020-01-10 DIAGNOSIS — I10 Essential (primary) hypertension: Secondary | ICD-10-CM | POA: Diagnosis not present

## 2020-01-10 LAB — CBC WITH DIFFERENTIAL/PLATELET
Abs Immature Granulocytes: 0.02 10*3/uL (ref 0.00–0.07)
Basophils Absolute: 0.1 10*3/uL (ref 0.0–0.1)
Basophils Relative: 1 %
Eosinophils Absolute: 0.1 10*3/uL (ref 0.0–0.5)
Eosinophils Relative: 1 %
HCT: 49.2 % (ref 39.0–52.0)
Hemoglobin: 16.2 g/dL (ref 13.0–17.0)
Immature Granulocytes: 0 %
Lymphocytes Relative: 31 %
Lymphs Abs: 2.6 10*3/uL (ref 0.7–4.0)
MCH: 28.9 pg (ref 26.0–34.0)
MCHC: 32.9 g/dL (ref 30.0–36.0)
MCV: 87.9 fL (ref 80.0–100.0)
Monocytes Absolute: 0.6 10*3/uL (ref 0.1–1.0)
Monocytes Relative: 7 %
Neutro Abs: 5.1 10*3/uL (ref 1.7–7.7)
Neutrophils Relative %: 60 %
Platelets: 225 10*3/uL (ref 150–400)
RBC: 5.6 MIL/uL (ref 4.22–5.81)
RDW: 14.6 % (ref 11.5–15.5)
WBC: 8.6 10*3/uL (ref 4.0–10.5)
nRBC: 0 % (ref 0.0–0.2)

## 2020-01-10 LAB — HEMOGLOBIN A1C
Hgb A1c MFr Bld: 6.8 % — ABNORMAL HIGH (ref 4.8–5.6)
Mean Plasma Glucose: 148.46 mg/dL

## 2020-01-10 LAB — PROTIME-INR
INR: 1 (ref 0.8–1.2)
Prothrombin Time: 12.6 seconds (ref 11.4–15.2)

## 2020-01-10 LAB — SEDIMENTATION RATE: Sed Rate: 7 mm/hr (ref 0–16)

## 2020-01-10 LAB — GLUCOSE, CAPILLARY
Glucose-Capillary: 111 mg/dL — ABNORMAL HIGH (ref 70–99)
Glucose-Capillary: 201 mg/dL — ABNORMAL HIGH (ref 70–99)

## 2020-01-10 LAB — HEPARIN LEVEL (UNFRACTIONATED)
Heparin Unfractionated: 0.89 IU/mL — ABNORMAL HIGH (ref 0.30–0.70)
Heparin Unfractionated: 0.5 IU/mL (ref 0.30–0.70)

## 2020-01-10 LAB — SARS CORONAVIRUS 2 BY RT PCR (HOSPITAL ORDER, PERFORMED IN ~~LOC~~ HOSPITAL LAB): SARS Coronavirus 2: NEGATIVE

## 2020-01-10 LAB — PROCALCITONIN: Procalcitonin: <0.1 ng/mL

## 2020-01-10 LAB — MAGNESIUM: Magnesium: 2.1 mg/dL (ref 1.7–2.4)

## 2020-01-10 LAB — C-REACTIVE PROTEIN: CRP: 0.6 mg/dL (ref <1.0)

## 2020-01-10 LAB — HIV ANTIBODY (ROUTINE TESTING W REFLEX): HIV Screen 4th Generation wRfx: NONREACTIVE

## 2020-01-10 MED ORDER — AMLODIPINE BESYLATE 10 MG PO TABS
10.0000 mg | ORAL_TABLET | Freq: Every day | ORAL | Status: DC
  Administered 2020-01-10 – 2020-01-11 (×2): 10 mg via ORAL
  Filled 2020-01-10 (×2): qty 1
  Filled 2020-01-10: qty 2

## 2020-01-10 MED ORDER — APIXABAN 5 MG PO TABS: 5.0000 mg | ORAL_TABLET | Freq: Two times a day (BID) | ORAL | Status: DC

## 2020-01-10 MED ORDER — IOHEXOL 350 MG/ML SOLN
100.0000 mL | Freq: Once | INTRAVENOUS | Status: AC | PRN
  Administered 2020-01-10: 100 mL via INTRAVENOUS

## 2020-01-10 MED ORDER — TACROLIMUS 1 MG PO CAPS: 2.0000 mg | ORAL_CAPSULE | ORAL | Status: DC

## 2020-01-10 MED ORDER — HEPARIN (PORCINE) 25000 UT/250ML-% IV SOLN
1150.0000 [IU]/h | INTRAVENOUS | Status: AC
  Administered 2020-01-10: 1300 [IU]/h via INTRAVENOUS
  Filled 2020-01-10: qty 250

## 2020-01-10 MED ORDER — POLYETHYLENE GLYCOL 3350 17 G PO PACK: 17.0000 g | PACK | Freq: Every day | ORAL | Status: DC | PRN

## 2020-01-10 MED ORDER — TACROLIMUS 1 MG PO CAPS
3.0000 mg | ORAL_CAPSULE | Freq: Every morning | ORAL | Status: DC
  Administered 2020-01-10 – 2020-01-11 (×2): 3 mg via ORAL
  Filled 2020-01-10 (×2): qty 3

## 2020-01-10 MED ORDER — ASCORBIC ACID 500 MG PO TABS
1000.0000 mg | ORAL_TABLET | Freq: Every day | ORAL | Status: DC
  Administered 2020-01-10 – 2020-01-11 (×2): 1000 mg via ORAL
  Filled 2020-01-10 (×2): qty 2

## 2020-01-10 MED ORDER — APIXABAN 5 MG PO TABS
10.0000 mg | ORAL_TABLET | Freq: Two times a day (BID) | ORAL | Status: DC
  Administered 2020-01-10 – 2020-01-11 (×2): 10 mg via ORAL
  Filled 2020-01-10 (×2): qty 2

## 2020-01-10 MED ORDER — ALLOPURINOL 300 MG PO TABS
300.0000 mg | ORAL_TABLET | Freq: Every day | ORAL | Status: DC
  Administered 2020-01-10 – 2020-01-11 (×2): 300 mg via ORAL
  Filled 2020-01-10: qty 3
  Filled 2020-01-10 (×2): qty 1

## 2020-01-10 MED ORDER — COQ10 100 MG PO CAPS: 100.0000 mg | ORAL_CAPSULE | Freq: Every day | ORAL | Status: DC

## 2020-01-10 MED ORDER — HEPARIN BOLUS VIA INFUSION
5000.0000 [IU] | Freq: Once | INTRAVENOUS | Status: AC
  Administered 2020-01-10: 5000 [IU] via INTRAVENOUS
  Filled 2020-01-10: qty 5000

## 2020-01-10 MED ORDER — TACROLIMUS 1 MG PO CAPS
2.0000 mg | ORAL_CAPSULE | Freq: Every evening | ORAL | Status: DC
  Administered 2020-01-10: 2 mg via ORAL
  Filled 2020-01-10 (×3): qty 2

## 2020-01-10 MED ORDER — INSULIN ASPART 100 UNIT/ML ~~LOC~~ SOLN: 0.0000 [IU] | Freq: Three times a day (TID) | SUBCUTANEOUS | Status: DC

## 2020-01-10 MED ORDER — ATORVASTATIN CALCIUM 10 MG PO TABS
5.0000 mg | ORAL_TABLET | ORAL | Status: DC
  Administered 2020-01-10 – 2020-01-11 (×2): 5 mg via ORAL
  Filled 2020-01-10 (×2): qty 1

## 2020-01-10 MED ORDER — VITAMIN D 25 MCG (1000 UNIT) PO TABS
1000.0000 [IU] | ORAL_TABLET | Freq: Every day | ORAL | Status: DC
  Administered 2020-01-10 – 2020-01-11 (×2): 1000 [IU] via ORAL
  Filled 2020-01-10 (×2): qty 1

## 2020-01-10 MED ORDER — INSULIN GLARGINE 100 UNIT/ML ~~LOC~~ SOLN
10.0000 [IU] | Freq: Every day | SUBCUTANEOUS | Status: DC
  Administered 2020-01-10: 10 [IU] via SUBCUTANEOUS
  Filled 2020-01-10 (×2): qty 0.1

## 2020-01-10 NOTE — H&P (Addendum)
Date: 01/10/2020               Patient Name:  Jeffery York MRN: [id number]  DOB: 06-27-1955 Age / Sex: 64 y.o., male   PCP: Colon Branch, MD              Medical Service: Internal Medicine Teaching Service              Attending Physician: Dr. Jimmye Norman, Elaina Pattee, MD    First Contact: Leory Plowman, MS3 Pager: [phone]  Second Contact: Dr. Fatima Sanger  Pager: [phone]  Third Contact Dr. Gilberto Better Pager: [phone]       After Hours (After 5p/  First Contact Pager: [phone]  weekends / holidays): Second Contact Pager: [phone]   Chief Complaint: "Coughing up blood"  History of Present Illness:  Jeffery York is a 64 y.o. 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    Patient reports having had a few episodes of intermittent hemoptysis. States that it only happened with coughing and that it doesn't have a pattern of when it occurs. Characterized it as a bright red consistency with trace amounts of mucus, and when he has the episodes he can "open his mouth and blood would just drip out." He reports that the amount he coughed up has increased over the past 2 weeks. Denies pain during episodes. States that it does not worsen with exertion but when he lays down he can feel some gurgling in his lungs. Recently states he has traveled 8 hours straight for a family reunion, and he got back two days ago. His last episode was at 3 PM yesterday and he reports that he can go a period of days between episodes. Endorses mild fatigue, minor weakness of extremities along with LLQ abdominal pain, he states that he thinks that is from the new finding of "diverticulosis/diverticulitis" they found on his recent colonoscopy since this pain comes and goes. Denies N/V, headache, fever, chills, chest pain, SOB, diarrhea, or any sleep disturbances. 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 States that the onset of that was from exposure at his work in the [employer]. He took 50-60 mg of steroids daily at the time and it helped clear the symptoms. His liver transplant was 5 years ago and he is currently still on Pograf. Denies history of blood clots.    Meds:  Current Meds  Medication Sig  . allopurinol (ZYLOPRIM) 300 MG tablet Take 300 mg by mouth daily.   Marland Kitchen amLODipine (NORVASC) 10 MG tablet Take 1 tablet (10 mg total) by mouth daily.  Marland Kitchen ascorbic acid (VITAMIN C) 500 MG tablet Take 1,000 mg by mouth daily.   Marland Kitchen atorvastatin (LIPITOR) 10 MG tablet Take 5 mg by mouth every other day. Takes 4x/ week  . cetirizine (ZYRTEC) 10 MG tablet Take 10 mg by mouth daily.  . cholecalciferol (VITAMIN D) 1000 units tablet Take 1,000 Units by mouth daily.   . Coenzyme Q10 (COQ10) 100 MG CAPS Take 100 mg by mouth daily.  . Colchicine 0.6 MG CAPS Take 0.6 mg by mouth 2 (two) times daily as needed (gout flare). (Patient taking differently: Take 0.6 mg by mouth daily. )  . cyanocobalamin 1000 MCG tablet Take 1,000 mcg by mouth daily.   . fluticasone (FLONASE) 50  MCG/ACT nasal spray Place 2 sprays into both nostrils daily as needed for allergies.   Marland Kitchen glipiZIDE (GLUCOTROL) 5 MG tablet Take 2.5 mg by mouth in the morning and at bedtime.  . hydrochlorothiazide (HYDRODIURIL) 25 MG tablet Take 25 mg by mouth daily.  . insulin glargine (LANTUS) 100 UNIT/ML injection Inject 17.5 Units into the skin daily at 2 PM.  . JARDIANCE 25 MG TABS tablet Take 12.5 mg by mouth daily.  . Magnesium Oxide 400 (240 Mg) MG TABS Take 400 mg by mouth in the morning and at bedtime.   . Omega-3 Fatty Acids (FISH OIL PO) Take 1 capsule by mouth daily.   . tacrolimus (PROGRAF) 1 MG capsule Take 2-3 mg by mouth See admin instructions. Take 3 capsules every morning and take 2 capsules every evening  Patient reports taking:  --HTCZ   --Amlodipine --Prograf --Allopurinol --Colchicine --Atorvastatin --CoQ 10 --B12 --Vitamin C --Vitamin D  --Magnesium Oxide --Zyrtec --Lantus 17.5 units --Jardiance half tablet --Glipizide   Allergies: Allergies as of 01/09/2020 - Review Complete 01/09/2020  Allergen Reaction Noted  . Benazepril hcl Swelling 11/29/2016  . Erythromycin ethylsuccinate  07/17/2006  . Keflex [cephalexin] Diarrhea 09/15/2015  --Benazepril HCl reported severe swelling of the tongue --Erythromycin & keflex reported N/V and diarrhea    Past Medical History:  Diagnosis Date  . Benign prostatic hypertrophy   . Cirrhosis ([place])   . Diabetes mellitus    initially 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 but then the diabetes resurface without the use of steroids  . [place] (hepatocellular carcinoma) ([place])    s/p TACE and microwave ablation  . Hep C w/o coma, chronic (HCC)    bx aprox 12-10, s/p Harvoni, on a transplant list candidate   . History of sarcoidosis    used steroids x 10 years  . Hyperlipidemia   . Hypertension   . OSA (obstructive sleep apnea) 01/15/2018  . 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 Uveitis    h/o  --Gout (current) --Acute diverticulitis (11/01/2018) --Liver transplant (5 years ago)  Past surgical history: --Liver transplant --Lobectomy  Family History:  --Father=Deceased at 53 due to cancer --Mom=Deceasad at 69 due to CHF --Brother=Deceased due to CAD and cancer --States there is a history of heart disease on mother's side --States he has no children  Social History: Patient is a retired English as a second language teacher, states he served in [employer] for 20 years. Reports he was in the [employer], worked on all sorts of ships, and performed a multitude of tasks. States he was exposed to a lot of different chemicals out there. Patient currently lives with wife in [place]. Denies smoking, drinking and illicit drug use. States he has a decent diet, and is carb conscious because of his diabetes.    Review of  Systems: A complete ROS was negative except as per HPI.  Physical Exam: Blood pressure 114/62, pulse (!) 53, temperature 97.8 F (36.6 C), temperature source Oral, resp. rate 10, height 5\' 9"  (1.753 m), weight 75.8 kg, SpO2 94 %.  EKG: personally reviewed my interpretation is normal sinus rhythm, appears to have LVH in the precordial leads   CXR: Not taken, they did a CT  CTA chest PE: IMPRESSION below 1. Small pulmonary emboli in the left upper lobe arteries and a small, short segment occlusion of the pulmonary arterial branch in the right middle lobe. 2. Large area of ground-glass opacity in the peripheral right lower lobe is most consistent with pneumonia. 3. Remote right upper lobectomy and  chronic interstitial lung disease.  Assessment & Plan by Problem: Mr. Veltre is a 64 y.o. 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    Active Problems:   Acute pulmonary embolism (HCC) Acute PE. Patient presented with worsening 2-week history of hemoptysis. Patient has remained afebrile with no elevations in leukocyte counts. CT scan suggested PE, pneumonia, and chronic ILD. Suspect this to be likely acute PE with the multiple small emboli seen in the LUL and RML. Heparin levels and procalcitonin have been ordered. Patient is currently hemodynamically stable so IV anticoagulants have been initiated. If patient has no new episodes of hemoptysis with IV heparin, can transition to oral form. Will monitor closely given that he is an immunocompromised patient. If this does not resolve, consider working up for other underlying cardiovascular changes given the family history of heart disease and also for any underlying pulmonary etiologies given the CTA findings.  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 Managed outpatient with [facility]. Received liver transplant 04/07/2015 in [place].   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 Managed by [facility] and reports no recent changes.  Plan:  --Continue [place] 10U -- SSI -- Glucose checks  Hx of Sarcoidosis. He has hx of pulmonary sarcoidosis dx in 1990, s/p Rt upper lobectomy for aspergilloma in 2000.   Gout. Present in his big toe of right foot, reports it is currently managed properly. Denies any current pain. Will continue his regular medication regimen. Plan: --Allopurinol 300 mg daily --Colchicine 0.6 mg daily  HTN. Managed outpatient and on stable regimen. Plan:  --Continue amlodipine 10 mg  HLD. Managed outpatient and on stable regimen. Plan:  --Continue atorvastatin 10 mg --Continue HCTZ 25 mg   Code status: FULL code Dispo: Admit patient to Observation with expected length of stay less than 2 midnights.  Signed: Leory Plowman, Medical Student 01/10/2020, 9:04 AM  Pager: [phone]  Attestation for Student Documentation I personally was present and performed or re-performed the history, physical exam and medical decision-making activities of this service and have verified that the service and findings are accurately documented in the student's note  JefferyBogacki is a 64 yo M w/ PMH of [place], hepatitis C s/p transplant, Sarcoidosis s/p R lobectomy, Gout, Steroid-induced DM, and diverticulosis presenting to [facility] w/ hemoptysis. He was found to have large ground-glass opacity on CTA chest as well as pulmonary emboli. He recently had a history of 8 hour car drive which may have been the provoking event for his VTE. Ground-glass opacity read as pneumonia and he is at risk for opportunistic infections due to Prograf but he has no clinical signs of pneumonia, including cough, dyspnea, hypoxia, fevers or  chills. Inflammatory markers are wnl and pro-calcitonin wnl. Possibly granuloma due to his sarcoidosis. Will need to f/u outpatient with his pulmonologist.  -Gilberto Better, PGY3 Pager: [phone]

## 2020-01-10 NOTE — Progress Notes (Signed)
ANTICOAGULATION CONSULT NOTE - Initial Consult  Pharmacy Consult for Heparin Indication: pulmonary embolus  Allergies  Allergen Reactions  . Benazepril Hcl Swelling    angioedema  . Erythromycin Ethylsuccinate     REACTION: GI Intolerance/vomiting  . Keflex [Cephalexin] Diarrhea    With cramps and Nausea    Patient Measurements: Height: 5\' 9"  (175.3 cm) Weight: 75.8 kg (167 lb) IBW/kg (Calculated) : 70.7  Vital Signs: Temp: 97.8 F (36.6 C) (08/07 0140) Temp Source: Oral (08/07 0140) BP: 119/74 (08/07 0140) Pulse Rate: 57 (08/07 0140)  Labs: Recent Labs    01/09/20 1815 01/10/20 0432  HGB  --  16.2  HCT  --  49.2  PLT  --  225  LABPROT  --  12.6  INR  --  1.0  CREATININE 1.10  --     Estimated Creatinine Clearance: 67 mL/min (by C-G formula based on SCr of 1.1 mg/dL).   Medical History: Past Medical History:  Diagnosis Date  . Benign prostatic hypertrophy   . Cirrhosis ([place])   . Diabetes mellitus    initially 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 but then the diabetes resurface without the use of steroids  . [place] (hepatocellular carcinoma) ([place])    s/p TACE and microwave ablation  . Hep C w/o coma, chronic (HCC)    bx aprox 12-10, s/p Harvoni, on a transplant list candidate   . History of sarcoidosis    used steroids x 10 years  . Hyperlipidemia   . Hypertension   . OSA (obstructive sleep apnea) 01/15/2018  . 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 Uveitis    h/o    Medications:  See electronic med rec  Assessment: 64 y.o. M presents with hemoptysis x 1 week. CT scan shows PE. To begin heparin per pharmacy. CBC ok at baseline. No AC PTA.  Goal of Therapy:  Heparin level 0.3-0.7 units/ml Monitor platelets by anticoagulation protocol: Yes   Plan:  Heparin IV bolus 5000 units Heparin gtt at 1300 units/hr Will f/u heparin level in 6 hours Daily heparin level and CBC  Sherlon Handing, PharmD, BCPS Please see amion for complete clinical pharmacist phone  list 01/10/2020,5:47 AM

## 2020-01-10 NOTE — ED Notes (Signed)
Report given to 6N

## 2020-01-10 NOTE — Progress Notes (Signed)
[place] for Heparin Indication: pulmonary embolus  Allergies  Allergen Reactions  . Benazepril Hcl Swelling    angioedema  . Erythromycin Ethylsuccinate     REACTION: GI Intolerance/vomiting  . Keflex [Cephalexin] Diarrhea    With cramps and Nausea    Patient Measurements: Height: 5\' 9"  (175.3 cm) Weight: 75.8 kg (167 lb) IBW/kg (Calculated) : 70.7  Heparin dosing weight: 75.8 kg   Vital Signs: Temp: 97.3 F (36.3 C) (08/07 0900) Temp Source: Temporal (08/07 0900) BP: 133/88 (08/07 1300) Pulse Rate: 74 (08/07 1300)  Labs: Recent Labs    01/09/20 1815 01/10/20 0432 01/10/20 1158  HGB  --  16.2  --   HCT  --  49.2  --   PLT  --  225  --   LABPROT  --  12.6  --   INR  --  1.0  --   HEPARINUNFRC  --   --  0.89*  CREATININE 1.10  --   --     Estimated Creatinine Clearance: 67 mL/min (by C-G formula based on SCr of 1.1 mg/dL).   Medical History: Past Medical History:  Diagnosis Date  . Benign prostatic hypertrophy   . Cirrhosis ([place])   . Diabetes mellitus    initially 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 but then the diabetes resurface without the use of steroids  . [place] (hepatocellular carcinoma) ([place])    s/p TACE and microwave ablation  . Hep C w/o coma, chronic (HCC)    bx aprox 12-10, s/p Harvoni, on a transplant list candidate   . History of sarcoidosis    used steroids x 10 years  . Hyperlipidemia   . Hypertension   . OSA (obstructive sleep apnea) 01/15/2018  . 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 Uveitis    h/o    Medications:  See electronic med rec  Assessment: 64 y.o. M presents with hemoptysis x 1 week. CT scan shows PE. Pharmacy consulted for heparin dosing. CBC wnl. No AC PTA. Heparin level supratherapeutic at 0.89 units/ml.   Goal of Therapy:  Heparin level 0.3-0.7 units/ml Monitor platelets by anticoagulation protocol: Yes   Plan:  Decrease heparin gtt to 1150 units/hr Will f/u heparin level in 6  hours Daily heparin level and CBC  Claudina Lick, PharmD PGY1 [place] Resident 01/10/2020 1:47 PM  Please check [email or web address] for unit-specific pharmacy phone numbers.

## 2020-01-10 NOTE — ED Provider Notes (Signed)
[facility] EMERGENCY DEPARTMENT Provider Note   CSN: [id number] Arrival date & time: 01/09/20  1657     History Chief Complaint  Patient presents with   Hemoptysis    Jeffery York is a 64 y.o. male.  The history is provided by the patient.  Cough Cough characteristics:  Productive Sputum characteristics:  Bloody Severity:  Moderate Onset quality:  Gradual Timing:  Intermittent Progression:  Worsening Chronicity:  New Smoker: no   Relieved by:  None tried Worsened by:  Nothing Associated symptoms: no chest pain, no fever and no shortness of breath   Patient with history of diabetes, liver transplant, sarcoidosis presents with hemoptysis. Patient reports on July 25 he had isolated episode of hemoptysis, and had 1 again 4 days later. Over the previous 24 hours he has had an episode of hemoptysis that was larger than usual and he reports blood was "dripping out of my mouth " He denies any hematemesis, the blood is only associated with coughing. He reports fatigue, but denies any active chest pain or shortness of breath. Reports previous history of sarcoidosis and feels this is similar to those episodes. 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 05/21/2013   Shoulder pain 12/05/2011   Erectile dysfunction 07/18/2011   Annual physical exam 01/12/2011   Hepatitis C ---cirrhosis --- s/p HARVONI-- hapatocellular cancer 04/16/2007   UVEITIS 04/16/2007   BENIGN PROSTATIC HYPERTROPHY 04/16/2007   UROLITHIASIS, HX OF 04/16/2007   SARCOIDOSIS 09/26/2006   DM II (diabetes mellitus, type II), controlled ([place]) 09/26/2006   Hyperlipidemia 09/26/2006   Essential hypertension 09/26/2006   OSTEOPOROSIS 09/26/2006    Past Surgical History:  Procedure Laterality Date   LIVER TRANSPLANT  04-2015   lung resection aspergilloma  2000       Family History  Problem Relation Age of Onset   Prostate cancer Brother        at age 25s   Pancreatic cancer Brother    CAD Brother    Lung cancer Father    Diabetes Other        father side    Colon cancer Neg Hx     Social History   Tobacco Use   Smoking status: Former Smoker    Packs/day: 1.00    Years: 10.00    Pack years: 10.00    Quit date: 1990    Years since quitting: 31.6   Smokeless tobacco: Never Used   Tobacco comment: remote smoker  Scientific laboratory technician Use: Never  used  Substance Use Topics   Alcohol use: No   Drug use: No    Home Medications Prior to Admission medications   Medication Sig Start Date End Date Taking? Authorizing Provider  allopurinol (ZYLOPRIM) 300 MG tablet Take 300 mg by mouth daily.  08/27/17  Yes [provider]  amLODipine (NORVASC) 10 MG tablet Take 1 tablet (10 mg total) by mouth daily. 08/07/17  Yes Paz, Alda Berthold, MD  ascorbic acid (VITAMIN C) 500 MG tablet Take 1,000 mg by mouth daily.    Yes [provider]  atorvastatin (LIPITOR) 10 MG tablet Take 5 mg by mouth every other day. Takes 4x/ week   Yes [provider]  cetirizine (ZYRTEC) 10 MG tablet Take 10 mg by mouth daily.   Yes  [provider]  cholecalciferol (VITAMIN D) 1000 units tablet Take 1,000 Units by mouth daily.    Yes [provider]  Coenzyme Q10 (COQ10) 100 MG CAPS Take 100 mg by mouth daily.   Yes [provider]  Colchicine 0.6 MG CAPS Take 0.6 mg by mouth 2 (two) times daily as needed (gout flare). Patient taking differently: Take 0.6 mg by mouth daily.  10/09/17  Yes Paz, Alda Berthold, MD  cyanocobalamin 1000 MCG tablet Take 1,000 mcg by mouth daily.    Yes [provider]  fluticasone (FLONASE) 50 MCG/ACT nasal spray Place 2 sprays into both nostrils daily as needed for allergies.  01/27/16  Yes [provider]  glipiZIDE (GLUCOTROL) 5 MG tablet Take 2.5 mg by mouth in the morning and at bedtime. 11/11/19  Yes [provider]  hydrochlorothiazide (HYDRODIURIL) 25 MG tablet Take 25 mg by mouth daily.   Yes [provider]  insulin glargine (LANTUS) 100 UNIT/ML injection Inject 17.5 Units into the skin daily at 2 PM.   Yes [provider]  JARDIANCE 25 MG TABS tablet Take 12.5 mg by mouth daily. 10/19/19  Yes [provider]  Magnesium Oxide 400 (240 Mg) MG TABS Take 400 mg by mouth in the morning and at bedtime.  05/09/17  Yes [provider]  Omega-3 Fatty Acids (FISH OIL PO) Take 1 capsule by mouth daily.    Yes [provider]  tacrolimus (PROGRAF) 1 MG capsule Take 2-3 mg by mouth See admin instructions. Take 3 capsules every morning and take 2 capsules every evening   Yes [provider]  LEVEMIR FLEXTOUCH 100 UNIT/ML Pen 15 Units daily.  04/12/15   [provider]    Allergies    Benazepril hcl, Erythromycin ethylsuccinate, and Keflex [cephalexin]  Review of Systems   Review of Systems  Constitutional: Negative for fever.  HENT: Negative for nosebleeds.   Respiratory: Positive for cough. Negative for shortness of breath.   Cardiovascular: Negative for chest pain.  Gastrointestinal: Negative  for blood in stool and vomiting.  All other systems reviewed and are negative.   Physical Exam Updated Vital Signs BP 125/67    Pulse 65    Temp 97.8 F (36.6 C) (Oral)    Resp 13    Ht 1.753 m (5\' 9" )    Wt 75.8 kg    SpO2 96%    BMI 24.66 kg/m   Physical Exam CONSTITUTIONAL: Well developed/well nourished, appears younger than stated age HEAD: Normocephalic/atraumatic EYES: EOMI/PERRL ENMT: Mucous membranes moist, no blood in either nare. No blood in oropharynx. No stridor or drooling NECK: supple no meningeal signs SPINE/BACK:entire spine nontender CV: S1/S2 noted, no murmurs/rubs/gallops  noted LUNGS: Lungs are clear to auscultation bilaterally, no apparent distress ABDOMEN: soft, nontender, no rebound or guarding, bowel sounds noted throughout abdomen, large healed scar noted GU:no cva tenderness NEURO: Pt is awake/alert/appropriate, moves all extremitiesx4.  No facial droop.   EXTREMITIES: pulses normal/equal, full ROM SKIN: warm, color normal PSYCH: no abnormalities of mood noted, alert and oriented to situation  ED Results / Procedures / Treatments   Labs (all labs ordered are listed, but only abnormal results are displayed) Labs Reviewed  COMPREHENSIVE METABOLIC PANEL - Abnormal; Notable for the following components:      Result Value   Glucose, Bld 122 (*)    All other components within normal limits  SARS CORONAVIRUS 2 BY RT PCR (HOSPITAL ORDER, [facility])  CBC WITH DIFFERENTIAL/PLATELET  PROTIME-INR  HEPARIN LEVEL (UNFRACTIONATED)    EKG EKG Interpretation  Date/Time:  Saturday January 10 2020 05:36:41 EDT Ventricular Rate:  62 PR Interval:    QRS Duration: 88 QT Interval:  424 QTC Calculation: 431 R Axis:   -34 Text Interpretation: Sinus rhythm Probable LVH with secondary repol abnrm Interpretation limited secondary to artifact No previous ECGs available Confirmed by Ripley Fraise [phone]) on 01/10/2020 5:43:46  AM   Radiology CT Angio Chest PE W and/or Wo Contrast  Result Date: 01/10/2020 CLINICAL DATA:  Hemoptysis EXAM: CT ANGIOGRAPHY CHEST WITH CONTRAST TECHNIQUE: Multidetector CT imaging of the chest was performed using the standard protocol during bolus administration of intravenous contrast. Multiplanar CT image reconstructions and MIPs were obtained to evaluate the vascular anatomy. CONTRAST:  138mL OMNIPAQUE IOHEXOL 350 MG/ML SOLN COMPARISON:  None. FINDINGS: Cardiovascular: Contrast injection is sufficient to demonstrate satisfactory opacification of the pulmonary arteries to the segmental level. There are small pulmonary emboli in the left upper lobe arteries. There is also a small, short segment occlusion of the pulmonary arterial branch in the right middle lobe. The size of the main pulmonary artery is normal. Heart size is normal, with no pericardial effusion. The course and caliber of the aorta are normal. There is no atherosclerotic calcification. Opacification decreased due to pulmonary arterial phase contrast bolus timing. There is no CT evidence of right heart strain. Mediastinum/Nodes: No mediastinal, hilar or axillary lymphadenopathy. Normal visualized thyroid. Thoracic esophageal course is normal. Lungs/Pleura: Large area of ground-glass opacity in the peripheral right lower lobe. Remote right upper lobectomy. There is biapical scarring that is unchanged since 03/08/2017. Multiple areas of interstitial opacity are also unchanged. No pleural effusion. Upper Abdomen: Contrast bolus timing is not optimized for evaluation of the abdominal organs. The visualized portions of the organs of the upper abdomen are normal. Musculoskeletal: No chest wall abnormality. No bony spinal canal stenosis. Review of the MIP images confirms the above findings. IMPRESSION: 1. Small pulmonary emboli in the left upper lobe arteries and a small, short segment occlusion of the pulmonary arterial branch in the right middle  lobe. 2. Large area of ground-glass opacity in the peripheral right lower lobe is most consistent with pneumonia. 3. Remote right upper lobectomy and chronic interstitial lung disease. Critical Value/emergent results were called by telephone at the time of interpretation on 01/10/2020 at 5:26 am to provider Ripley Fraise , who verbally acknowledged these results. Electronically Signed   By: Ulyses Jarred M.D.   On: 01/10/2020 05:26    Procedures .Critical Care Performed by: Ripley Fraise, MD Authorized by: Ripley Fraise, MD   Critical care provider statement:    Critical care time (minutes):  30  Critical care start time:  01/10/2020 5:00 AM   Critical care end time:  01/10/2020 5:30 AM   Critical care time was exclusive of:  Separately billable procedures and treating other patients   Critical care was necessary to treat or prevent imminent or life-threatening deterioration of the following conditions:  Respiratory failure   Critical care was time spent personally by me on the following activities:  Development of treatment plan with patient or surrogate, evaluation of patient's response to treatment, examination of patient, obtaining history from patient or surrogate, re-evaluation of patient's condition, pulse oximetry, ordering and review of radiographic studies and ordering and review of laboratory studies   I assumed direction of critical care for this patient from another provider in my specialty: no      Medications Ordered in ED Medications  heparin ADULT infusion 100 units/mL (25000 units/226mL sodium chloride 0.45%) (1,300 Units/hr Intravenous New Bag/Given 01/10/20 0629)  iohexol (OMNIPAQUE) 350 MG/ML injection 100 mL (100 mLs Intravenous Contrast Given 01/10/20 0511)  heparin bolus via infusion 5,000 Units (5,000 Units Intravenous Bolus from Bag 01/10/20 0641)    ED Course  I have reviewed the triage vital signs and the nursing notes.  Pertinent labs & imaging results that were  available during my care of the patient were reviewed by me and considered in my medical decision making (see chart for details).    MDM Rules/Calculators/A&P                          6:12 AM Patient with history of liver transplant as well as previous history of sarcoidosis presented with cough and hemoptysis. He denied having any chest pain or shortness of breath. Patient reports he felt that it was due to his sarcoidosis reoccurring. However due to increasing symptoms of worsening hemoptysis, CT chest was ordered. CT chest was reviewed by myself and I discussed the findings with the radiologist Dr. Collins [place]. Patient was found to have pulmonary embolism as well as potential pneumonia infiltrates. Patient denied any recent upper respiratory symptoms. He denies known history of PE in the past. He would need to be screened for COVID-19. Heparin has been ordered per pharmacy consult, patient denies any recent GI bleed symptoms 7:30 AM Patient reports he does not see Dr. Larose Kells anymore and does not have his medications filled by them.  He reports he is primarily followed by the [facility]. Discussed with internal medicine residency who will admit the patient.  Final Clinical Impression(s) / ED Diagnoses Final diagnoses:  Hemoptysis  Multiple subsegmental pulmonary emboli without acute cor pulmonale [facility])    Rx / DC Orders ED Discharge Orders    None       Ripley Fraise, MD 01/10/20 [phone]

## 2020-01-11 DIAGNOSIS — I2609 Other pulmonary embolism with acute cor pulmonale: Secondary | ICD-10-CM

## 2020-01-11 DIAGNOSIS — I2699 Other pulmonary embolism without acute cor pulmonale: Secondary | ICD-10-CM | POA: Diagnosis not present

## 2020-01-11 LAB — CBC
HCT: 45.6 % (ref 39.0–52.0)
Hemoglobin: 14.8 g/dL (ref 13.0–17.0)
MCH: 28.7 pg (ref 26.0–34.0)
MCHC: 32.5 g/dL (ref 30.0–36.0)
MCV: 88.4 fL (ref 80.0–100.0)
Platelets: 217 10*3/uL (ref 150–400)
RBC: 5.16 MIL/uL (ref 4.22–5.81)
RDW: 14.5 % (ref 11.5–15.5)
WBC: 7.2 10*3/uL (ref 4.0–10.5)
nRBC: 0 % (ref 0.0–0.2)

## 2020-01-11 LAB — GLUCOSE, CAPILLARY
Glucose-Capillary: 120 mg/dL — ABNORMAL HIGH (ref 70–99)
Glucose-Capillary: 109 mg/dL — ABNORMAL HIGH (ref 70–99)

## 2020-01-11 LAB — HEPARIN LEVEL (UNFRACTIONATED): Heparin Unfractionated: 1.98 IU/mL — ABNORMAL HIGH (ref 0.30–0.70)

## 2020-01-11 MED ORDER — APIXABAN 5 MG PO TABS: 5.0000 mg | ORAL_TABLET | Freq: Two times a day (BID) | ORAL | 0 refills | Status: DC

## 2020-01-11 NOTE — Progress Notes (Signed)
   Subjective:  Jeffery York is a 64 y.o. M with PMH of sarcoidosis, hepatitis C, HCC w/ liver transplant, htn, steroid induced diabetes admit for hemoptysis on hospital day 0  Jeffery York was examined and evaluated at bedside this am. He was observed resting comfortably in bed. He states that he feels well and had 1 more episode of productive sputum with blood-tinged mucus but felt well otherwise. Discussed recent CT findings and advised to f/u closely w/ pulm. He mentions previously being followed by [facility] pulmonology as well as Dr.Sood with [facility] and mentions that he was previously told 'something they found on my lung' is chronic and stable.  Objective:  Vital signs in last 24 hours: Vitals:   01/10/20 1448 01/10/20 1529 01/10/20 2117 01/11/20 0430  BP:  137/73 125/70 126/71  Pulse:  61 (!) 48 (!) 50  Resp:  _0 Temp: 97.6 F (36.4 C) 98.1 F (36.7 C)  97.7 F (36.5 C)  TempSrc: Oral Oral  Oral  SpO2:  98% 97% 96%  Weight:      Height:       Gen: Well-developed, well nourished, NAD Pulm: CTAB, No rales, no wheezes, no dullness to percussion  Extm: ROM intact, Peripheral pulses intact, No peripheral edema Skin: Dry, Warm  Assessment/Plan:  Active Problems:   Acute pulmonary embolism (HCC)  Jeffery York is a 64 y.o. M with PMH of sarcoidosis, hepatitis C, HCC w/ liver transplant, htn, steroid induced diabetes admit for acute pulmonary emboli  Hemoptysis 2/2 provoked pulmonary emboli Present w/ episode of hemoptysis. Found to have emboli on CTA. Provoked due to history of recent travel. Tolerated eliquis overnight w/o significant bleeding event. Hemoglobin stable at 14.8. Currently not requiring oxygen. 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 Jeffery York [facility]  showed [facility] significant finding which [facility]. [facility]. He denies any significant productive cough, fevers, chills, nausea or other signs of infection. Negative pro-calcitonin, ESR, CRP. 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 Currently on Prograf. LFTs wnl on admission. - C/w home meds: tacrolimus 97m am, 282mpm  DVT prophx: Eliquis Diet: Cardiac Bowel: Miralax Code: Full  Prior to Admission Living Arrangement: Home Anticipated Discharge Location: Home Barriers to Discharge: None Dispo: Anticipated discharge in approximately today.   LeMosetta AnisMD 01/11/2020, 11:32 AM Pager: [phone] 5pm on weekdays and 1pm on weekends: On Call Pager: [phone]

## 2020-01-11 NOTE — Care Management Obs Status (Signed)
[place] NOTIFICATION   Patient Details  Name: Jeffery York MRN: [id number] Date of Birth: 09-16-1955   Medicare Observation Status Notification Given:  Yes    Claudie Leach, RN 01/11/2020, 1:09 PM

## 2020-01-11 NOTE — Discharge Instructions (Signed)
Information on my medicine - ELIQUIS (apixaban)  Why was Eliquis prescribed for you? Eliquis was prescribed to treat blood clots that may have been found in the veins of your legs (deep vein thrombosis) or in your lungs (pulmonary embolism) and to reduce the risk of them occurring again.  What do You need to know about Eliquis ? The starting dose is 10 mg (two 5 mg tablets) taken TWICE daily for the FIRST SEVEN (7) DAYS, then on the evening of 01/17/20  the dose is reduced to ONE 5 mg tablet taken TWICE daily.  Eliquis may be taken with or without food.   Try to take the dose about the same time in the morning and in the evening. If you have difficulty swallowing the tablet whole please discuss with your pharmacist how to take the medication safely.  Take Eliquis exactly as prescribed and DO NOT stop taking Eliquis without talking to the doctor who prescribed the medication.  Stopping may increase your risk of developing a new blood clot.  Refill your prescription before you run out.  After discharge, you should have regular check-up appointments with your healthcare provider that is prescribing your Eliquis.    What do you do if you miss a dose? If a dose of ELIQUIS is not taken at the scheduled time, take it as soon as possible on the same day and twice-daily administration should be resumed. The dose should not be doubled to make up for a missed dose.  Important Safety Information A possible side effect of Eliquis is bleeding. You should call your healthcare provider right away if you experience any of the following: ? Bleeding from an injury or your nose that does not stop. ? Unusual colored urine (red or dark brown) or unusual colored stools (red or black). ? Unusual bruising for unknown reasons. ? A serious fall or if you hit your head (even if there is no bleeding).  Some medicines may interact with Eliquis and might increase your risk of bleeding or clotting while on  Eliquis. To help avoid this, consult your healthcare provider or pharmacist prior to using any new prescription or non-prescription medications, including herbals, vitamins, non-steroidal anti-inflammatory drugs (NSAIDs) and supplements.  This website has more information on Eliquis (apixaban): http://www.eliquis.com/eliquis/home

## 2020-01-11 NOTE — Discharge Summary (Addendum)
Name: Jeffery York MRN: [id number] DOB: 26-Dec-1955 64 y.o. PCP: Colon Branch, MD  Date of Admission: 01/09/2020  5:07 PM Date of Discharge: 01/11/2020 Attending Physician: Angelica Pou, MD  Discharge Diagnosis: 1. Acute pulmonary emboli 2. RLL ground-glass opacity  Discharge Medications: Allergies as of 01/11/2020      Reactions   Benazepril Hcl Swelling   angioedema   Erythromycin Ethylsuccinate    REACTION: GI Intolerance/vomiting   Keflex [cephalexin] Diarrhea   With cramps and Nausea      Medication List    TAKE these medications   allopurinol 300 MG tablet Commonly known as: ZYLOPRIM Take 300 mg by mouth daily.   amLODipine 10 MG tablet Commonly known as: NORVASC Take 1 tablet (10 mg total) by mouth daily.   apixaban 5 MG Tabs tablet Commonly known as: ELIQUIS Take 1-2 tablets (5-10 mg total) by mouth 2 (two) times daily. Take 10mg  (2 tablets) twice daily for 6 more days then 5mg  twice daily Start taking on: January 17, 2020   ascorbic acid 500 MG tablet Commonly known as: VITAMIN C Take 1,000 mg by mouth daily.   atorvastatin 10 MG tablet Commonly known as: LIPITOR Take 5 mg by mouth every other day. Takes 4x/ week   cetirizine 10 MG tablet Commonly known as: ZYRTEC Take 10 mg by mouth daily.   cholecalciferol 1000 units tablet Commonly known as: VITAMIN D Take 1,000 Units by mouth daily.   Colchicine 0.6 MG Caps Take 0.6 mg by mouth 2 (two) times daily as needed (gout flare). What changed: when to take this   CoQ10 100 MG Caps Take 100 mg by mouth daily.   cyanocobalamin 1000 MCG tablet Take 1,000 mcg by mouth daily.   FISH OIL PO Take 1 capsule by mouth daily.   fluticasone 50 MCG/ACT nasal spray Commonly known as: FLONASE Place 2 sprays into both nostrils daily as needed for allergies.   glipiZIDE 5 MG tablet Commonly known as: GLUCOTROL Take 2.5 mg by mouth in the morning and at bedtime.   hydrochlorothiazide 25 MG  tablet Commonly known as: HYDRODIURIL Take 25 mg by mouth daily.   insulin glargine 100 UNIT/ML injection Commonly known as: LANTUS Inject 17.5 Units into the skin daily at 2 PM.   Jardiance 25 MG Tabs tablet Generic drug: empagliflozin Take 12.5 mg by mouth daily.   Levemir FlexTouch 100 UNIT/ML FlexPen Generic drug: insulin detemir 15 Units daily.   Magnesium Oxide 400 (240 Mg) MG Tabs Take 400 mg by mouth in the morning and at bedtime.   tacrolimus 1 MG capsule Commonly known as: PROGRAF Take 2-3 mg by mouth See admin instructions. Take 3 capsules every morning and take 2 capsules every evening       Disposition and follow-up:   Jeffery York was discharged from [facility] in Stable condition.  At the hospital follow up visit please address:  1.  Pulmonary Emboli - Ensure he takes his eliquis as prescribed - Assess for respiratory distress  2. RLL ground-glass opacity - No evidence of acute infection on admission and describes prior hx of [facility] - Ensure he f/u with his pulmonologist  3.  Labs / imaging needed at time of follow-up: CT chest, cbc  4.  Pending labs/ test needing follow-up: N/A  Follow-up Appointments:  Follow-up Information    Chesley Mires, MD. Call.   Specialty: Pulmonary Disease Contact information: [address] [place] [address] [phone]        Colon Branch, MD. Call.   Specialty: Internal Medicine Contact information: [address] [place] [address] [phone]              Hospital Course by problem list: 1.  Pulmonary Emboli: Jeffery York is a 64 yo M w/ PMH of sarcoidosis with pulmonary involvement, Hepatitis C and Hep C s/p liver transplant, and steroid-induced diabetes presenting to [facility] w/ hemoptysis. He mentioned recent history of prolonged travel and prior hx of malignancy and CTA chest was performed showing evidence of  multiple pulmonary emboli in left upper lobe and right middle lobe. He was started on heparin drip and transitioned to eliquis. His blood count remained stable during hospitalization and was discharged with recommendation to continue anti-coagulation for at least 3 months.  2. RLL ground-glass opacity: Incidental finding on CTA chest without clinical evidence of pneumonia. Inflammatory markers, pro-calcitonin negative. Per history, he mentions having similar findings on prior imaging at the [facility] pulmonology and was informed this was stable. Unable to confirm but advised to f/u promptly with his pulmonologist for further work-up at discharge.  Discharge Vitals:   BP 131/74 (BP Location: Right Arm)   Pulse 62   Temp 97.7 F (36.5 C) (Oral)   Resp 16   Ht 5\' 9"  (1.753 m)   Wt 75.8 kg   SpO2 97%   BMI 24.66 kg/m   Pertinent Labs, Studies, and Procedures:  CBC    Component Value Date/Time   WBC 7.2 01/11/2020 0151   RBC 5.16 01/11/2020 0151   HGB 14.8 01/11/2020 0151   HCT 45.6 01/11/2020 0151   PLT 217 01/11/2020 0151   MCV 88.4 01/11/2020 0151   MCH 28.7 01/11/2020 0151   MCHC 32.5 01/11/2020 0151   RDW 14.5 01/11/2020 0151   LYMPHSABS 2.6 01/10/2020 0432   MONOABS 0.6 01/10/2020 0432   EOSABS 0.1 01/10/2020 0432   BASOSABS 0.1 01/10/2020 0432    BMP Latest Ref Rng & Units 01/09/2020 11/01/2018 08/02/2017  Glucose 70 - 99 mg/dL 122(H) 149(H) 114(H)  BUN 8 - 23 mg/dL 22 25(H) 25(H)  Creatinine 0.61 - 1.24 mg/dL 1.10 1.16 0.99  Sodium 135 - 145 mmol/L 138 136 136  Potassium 3.5 - 5.1 mmol/L 3.8 3.5 4.4  Chloride 98 - 111 mmol/L 104 99 102  CO2 22 - 32 mmol/L 26 28 30   Calcium 8.9 - 10.3 mg/dL 8.9 9.2 9.8   CT ANGIOGRAPHY CHEST WITH CONTRAST  FINDINGS: Cardiovascular: Contrast injection is sufficient to demonstrate satisfactory opacification of the pulmonary arteries to the segmental level. There are small pulmonary emboli in the left upper lobe arteries. There is also a small,  short segment occlusion of the pulmonary arterial branch in the right middle lobe. The size of the main pulmonary artery is normal. Heart size is normal, with no pericardial effusion. The course and caliber of the aorta are normal. There is no atherosclerotic calcification. Opacification decreased due to pulmonary arterial phase contrast bolus timing. There is no CT evidence of right heart strain.  Mediastinum/Nodes: No mediastinal, hilar or axillary lymphadenopathy. Normal visualized thyroid. Thoracic esophageal course is normal.  Lungs/Pleura: Large area of ground-glass opacity in the peripheral right lower lobe. Remote right upper lobectomy. There is biapical scarring that is unchanged since 03/08/2017. Multiple areas of interstitial opacity are also unchanged. No pleural effusion.  Upper Abdomen: Contrast bolus timing is not optimized for evaluation  of the abdominal organs. The visualized portions of the organs of the upper abdomen are normal.  Musculoskeletal: No chest wall abnormality. No bony spinal canal stenosis.  Review of the MIP images confirms the above findings.  IMPRESSION: 1. Small pulmonary emboli in the left upper lobe arteries and a small, short segment occlusion of the pulmonary arterial branch in the right middle lobe. 2. Large area of ground-glass opacity in the peripheral right lower lobe is most consistent with pneumonia. 3. Remote right upper lobectomy and chronic interstitial lung disease.  Discharge Instructions: Discharge Instructions    Call MD for:  difficulty breathing, headache or visual disturbances   Complete by: As directed    Call MD for:  persistant nausea and vomiting   Complete by: As directed    Call MD for:  severe uncontrolled pain   Complete by: As directed    Call MD for:  temperature >100.4   Complete by: As directed    Diet - low sodium heart healthy   Complete by: As directed    Discharge instructions   Complete by:  As directed    Dear [name]  You came to [place] with bloody cough. We have determined this was caused blood clots in your lungs. Here are our recommendations for you at discharge:  Please take eliquis twice daily at 10mg  (2 tablets) twice daily for 6 additional days from discharge, then start taking 1 tablet twice daily. Make sure to follow up with your lung doctor  Thank you for choosing [facility].   Increase activity slowly   Complete by: As directed      Signed: Mosetta Anis, MD 01/11/2020, 6:04 PM Pager: [phone] After 5pm on weekdays and 1pm on weekends: On Call Pager: [phone]

## 2020-01-11 NOTE — Care Management (Addendum)
Patient given 30 day free Eliquis card.

## 2020-01-20 ENCOUNTER — Encounter: Payer: Self-pay | Admitting: Primary Care

## 2020-01-20 ENCOUNTER — Ambulatory Visit (INDEPENDENT_AMBULATORY_CARE_PROVIDER_SITE_OTHER): Payer: Medicare Other | Admitting: Primary Care

## 2020-01-20 ENCOUNTER — Other Ambulatory Visit: Payer: Self-pay

## 2020-01-20 VITALS — BP 126/68 | HR 70 | Temp 97.5°F | Ht 68.0 in | Wt 169.2 lb

## 2020-01-20 DIAGNOSIS — D869 Sarcoidosis, unspecified: Secondary | ICD-10-CM

## 2020-01-20 DIAGNOSIS — I2699 Other pulmonary embolism without acute cor pulmonale: Secondary | ICD-10-CM | POA: Diagnosis not present

## 2020-01-20 LAB — CBC WITH DIFFERENTIAL/PLATELET
Basophils Absolute: 0 10*3/uL (ref 0.0–0.1)
Basophils Relative: 0.4 % (ref 0.0–3.0)
Eosinophils Absolute: 0.1 10*3/uL (ref 0.0–0.7)
Eosinophils Relative: 1.1 % (ref 0.0–5.0)
HCT: 46.2 % (ref 39.0–52.0)
Hemoglobin: 15.3 g/dL (ref 13.0–17.0)
Lymphocytes Relative: 34.7 % (ref 12.0–46.0)
Lymphs Abs: 3 10*3/uL (ref 0.7–4.0)
MCHC: 33 g/dL (ref 30.0–36.0)
MCV: 87.7 fl (ref 78.0–100.0)
Monocytes Absolute: 0.6 10*3/uL (ref 0.1–1.0)
Monocytes Relative: 7 % (ref 3.0–12.0)
Neutro Abs: 4.9 10*3/uL (ref 1.4–7.7)
Neutrophils Relative %: 56.8 % (ref 43.0–77.0)
Platelets: 272 10*3/uL (ref 150.0–400.0)
RBC: 5.28 Mil/uL (ref 4.22–5.81)
RDW: 15.6 % — ABNORMAL HIGH (ref 11.5–15.5)
WBC: 8.6 10*3/uL (ref 4.0–10.5)

## 2020-01-20 MED ORDER — ALBUTEROL SULFATE HFA 108 (90 BASE) MCG/ACT IN AERS: 1.0000 | INHALATION_SPRAY | Freq: Four times a day (QID) | RESPIRATORY_TRACT | 0 refills | Status: DC | PRN

## 2020-01-20 MED ORDER — APIXABAN 5 MG PO TABS: 5.0000 mg | ORAL_TABLET | Freq: Two times a day (BID) | ORAL | 2 refills | Status: DC

## 2020-01-20 NOTE — Progress Notes (Signed)
@Patient  ID: Jeffery York, male    DOB: 05/13/1956, 64 y.o.   MRN: [id number]  Chief Complaint  Patient presents with  . Follow-up    Referring provider: Colon Branch, MD  HPI: 64 year old male, former smoker. PMH significant for sarcoidosis, OSA on CPAP, BPH, liver cirrhosis, hep C s/p transplant 2016, hepatocellular carcinoma, HLD, HTN, uveities, osteoporosis, DM, gout. Patient of Dr. Halford Chessman, last seen on 03/25/18. Maintained on auto CPAP 5-10cm h20. Ordered for mask re-fitting. Recently admitted on 8/6-01/11/20 for hemoptysis, RLL ground glass opacity, acute pulmonary emboli. CTA chest was performed showing evidence of multiple pulmonary emboli in left upper lobe and right middle lobe. He was started on heparin gtt and transitioned to Eliquis. Needs to continue anticoagulation for minimum 3 months.   01/20/2020 Patient presents today for hospital follow-up for PE. He is doing ok, no breathing issues. He is not on oxygen. He has some chest congestion. Cough is dry, non-productive. He has not had episode of hemoptysis in 3 days. He continues Eliquis 5mg  twice daily. Preceding PE he did have parotid surgery and drove 8 hours to [place]. He has hx sarcoidosis liver. He tells me he was in the [employer].    Imaging:  01/10/20 CTA -Small pulmonary emboli in the left upper lobe arteries and a small, short segment occlusion of the pulmonary arterial branch in the RML. Large area of ground-glass opacity in the peripheral right lower lobe. Remote right upper lobectomy. There is biapical scarring that is unchanged since 03/08/2017. Multiple areas of interstitial opacity are also unchanged. No pleural effusion  HST 01/11/18 >> AHI 19.9, SaO2 low 78% Auto CPAP 02/20/18 to 03/21/18 >> used on 30 of 30 nights with average 7 hrs 34 min.  Average AHI 4.2 with median CPAP 8 and 95 th percentile CPAP 10 cm H2O.  Allergies  Allergen Reactions  . Benazepril Hcl Swelling    angioedema  . Erythromycin  Ethylsuccinate     REACTION: GI Intolerance/vomiting  . Keflex [Cephalexin] Diarrhea    With cramps and Nausea    Immunization History  Administered Date(s) Administered  . H1N1 06/23/2008  . Hep A / Hep B 05/26/2013  . Influenza Split 05/05/2011  . Influenza Whole 04/16/2007, 03/09/2009, 04/05/2010  . Influenza, Seasonal, Injecte, Preservative Fre 04/30/2013  . Influenza,inj,Quad PF,6+ Mos 03/25/2018  . Influenza-Unspecified 02/04/2015, 02/04/2016  . Pneumococcal Conjugate-13 07/23/2014  . Pneumococcal Polysaccharide-23 03/05/2006, 05/21/2013  . Td 06/05/2002  . Tdap 05/21/2013  . Zoster 11/04/2014    Past Medical History:  Diagnosis Date  . Benign prostatic hypertrophy   . Cirrhosis ([place])   . Diabetes mellitus    initially 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 but then the diabetes resurface without the use of steroids  . Prince (hepatocellular carcinoma) ([place])    s/p TACE and microwave ablation  . Hep C w/o coma, chronic (HCC)    bx aprox 12-10, s/p Harvoni, on a transplant list candidate   . History of sarcoidosis    used steroids x 10 years  . Hyperlipidemia   . Hypertension   . OSA (obstructive sleep apnea) 01/15/2018  . 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 Uveitis    h/o    Tobacco History: Social History   Tobacco Use  Smoking Status Former Smoker  . Packs/day: 1.00  . Years: 10.00  . Pack years: 10.00  . Quit date: 78  . Years since quitting: 31.6  Smokeless Tobacco Never Used  Tobacco Comment  remote smoker   Counseling given: Not Answered Comment: remote smoker   Outpatient Medications Prior to Visit  Medication Sig Dispense Refill  . allopurinol (ZYLOPRIM) 300 MG tablet Take 300 mg by mouth daily.     Marland Kitchen amLODipine (NORVASC) 10 MG tablet Take 1 tablet (10 mg total) by mouth daily. 90 tablet 1  . ascorbic acid (VITAMIN C) 500 MG tablet Take 1,000 mg by mouth daily.     Marland Kitchen atorvastatin (LIPITOR) 10 MG tablet Take 5 mg by mouth every other day. Takes 4x/ week     . cetirizine (ZYRTEC) 10 MG tablet Take 10 mg by mouth daily.    . cholecalciferol (VITAMIN D) 1000 units tablet Take 1,000 Units by mouth daily.     . Coenzyme Q10 (COQ10) 100 MG CAPS Take 100 mg by mouth daily.    . Colchicine 0.6 MG CAPS Take 0.6 mg by mouth 2 (two) times daily as needed (gout flare). (Patient taking differently: Take 0.6 mg by mouth daily. ) 60 capsule 1  . cyanocobalamin 1000 MCG tablet Take 1,000 mcg by mouth daily.     . fluticasone (FLONASE) 50 MCG/ACT nasal spray Place 2 sprays into both nostrils daily as needed for allergies.     Marland Kitchen glipiZIDE (GLUCOTROL) 5 MG tablet Take 2.5 mg by mouth in the morning and at bedtime.    . hydrochlorothiazide (HYDRODIURIL) 25 MG tablet Take 25 mg by mouth daily.    . insulin glargine (LANTUS) 100 UNIT/ML injection Inject 17.5 Units into the skin daily at 2 PM.    . JARDIANCE 25 MG TABS tablet Take 12.5 mg by mouth daily.    Marland Kitchen LEVEMIR FLEXTOUCH 100 UNIT/ML Pen 15 Units daily.     . Magnesium Oxide 400 (240 Mg) MG TABS Take 400 mg by mouth in the morning and at bedtime.     . tacrolimus (PROGRAF) 1 MG capsule Take 2-3 mg by mouth See admin instructions. Take 3 capsules every morning and take 2 capsules every evening    . apixaban (ELIQUIS) 5 MG TABS tablet Take 1-2 tablets (5-10 mg total) by mouth 2 (two) times daily. Take 10mg  (2 tablets) twice daily for 6 more days then 5mg  twice daily 60 tablet 0  . Omega-3 Fatty Acids (FISH OIL PO) Take 1 capsule by mouth daily.  (Patient not taking: Reported on 01/20/2020)     No facility-administered medications prior to visit.    Review of Systems  Review of Systems  Constitutional: Negative.   Respiratory: Positive for cough and wheezing. Negative for chest tightness and shortness of breath.   Cardiovascular: Negative.     Physical Exam  BP 126/68 (BP Location: Left Arm, Cuff Size: Normal)   Pulse 70   Temp (!) 97.5 F (36.4 C) (Oral)   Ht 5\' 8"  (1.727 m)   Wt 169 lb 3.2 oz (76.7 kg)    SpO2 96%   BMI 25.73 kg/m  Physical Exam Constitutional:      Appearance: Normal appearance.  HENT:     Head: Normocephalic and atraumatic.  Cardiovascular:     Rate and Rhythm: Normal rate and regular rhythm.  Pulmonary:     Effort: Pulmonary effort is normal.     Breath sounds: Wheezing present.  Neurological:     General: No focal deficit present.     Mental Status: He is alert and oriented to person, place, and time. Mental status is at baseline.  Psychiatric:  Mood and Affect: Mood normal.        Behavior: Behavior normal.        Thought Content: Thought content normal.        Judgment: Judgment normal.      Lab Results:  CBC    Component Value Date/Time   WBC 8.6 01/20/2020 1456   RBC 5.28 01/20/2020 1456   HGB 15.3 01/20/2020 1456   HCT 46.2 01/20/2020 1456   PLT 272.0 01/20/2020 1456   MCV 87.7 01/20/2020 1456   MCH 28.7 01/11/2020 0151   MCHC 33.0 01/20/2020 1456   RDW 15.6 (H) 01/20/2020 1456   LYMPHSABS 3.0 01/20/2020 1456   MONOABS 0.6 01/20/2020 1456   EOSABS 0.1 01/20/2020 1456   BASOSABS 0.0 01/20/2020 1456    BMET    Component Value Date/Time   NA 138 01/09/2020 1815   NA 140 12/29/2015 0000   K 3.8 01/09/2020 1815   CL 104 01/09/2020 1815   CO2 26 01/09/2020 1815   GLUCOSE 122 (H) 01/09/2020 1815   GLUCOSE 187 (H) 03/21/2006 1008   BUN 22 01/09/2020 1815   BUN 16 07/12/2015 0000   CREATININE 1.10 01/09/2020 1815   CREATININE 1.34 (H) 02/04/2016 1617   CALCIUM 8.9 01/09/2020 1815   GFRNONAA >60 01/09/2020 1815   GFRAA >60 01/09/2020 1815    BNP No results found for: BNP  ProBNP No results found for: PROBNP  Imaging: CT Angio Chest PE W and/or Wo Contrast  Result Date: 01/10/2020 CLINICAL DATA:  Hemoptysis EXAM: CT ANGIOGRAPHY CHEST WITH CONTRAST TECHNIQUE: Multidetector CT imaging of the chest was performed using the standard protocol during bolus administration of intravenous contrast. Multiplanar CT image reconstructions  and MIPs were obtained to evaluate the vascular anatomy. CONTRAST:  161mL OMNIPAQUE IOHEXOL 350 MG/ML SOLN COMPARISON:  None. FINDINGS: Cardiovascular: Contrast injection is sufficient to demonstrate satisfactory opacification of the pulmonary arteries to the segmental level. There are small pulmonary emboli in the left upper lobe arteries. There is also a small, short segment occlusion of the pulmonary arterial branch in the right middle lobe. The size of the main pulmonary artery is normal. Heart size is normal, with no pericardial effusion. The course and caliber of the aorta are normal. There is no atherosclerotic calcification. Opacification decreased due to pulmonary arterial phase contrast bolus timing. There is no CT evidence of right heart strain. Mediastinum/Nodes: No mediastinal, hilar or axillary lymphadenopathy. Normal visualized thyroid. Thoracic esophageal course is normal. Lungs/Pleura: Large area of ground-glass opacity in the peripheral right lower lobe. Remote right upper lobectomy. There is biapical scarring that is unchanged since 03/08/2017. Multiple areas of interstitial opacity are also unchanged. No pleural effusion. Upper Abdomen: Contrast bolus timing is not optimized for evaluation of the abdominal organs. The visualized portions of the organs of the upper abdomen are normal. Musculoskeletal: No chest wall abnormality. No bony spinal canal stenosis. Review of the MIP images confirms the above findings. IMPRESSION: 1. Small pulmonary emboli in the left upper lobe arteries and a small, short segment occlusion of the pulmonary arterial branch in the right middle lobe. 2. Large area of ground-glass opacity in the peripheral right lower lobe is most consistent with pneumonia. 3. Remote right upper lobectomy and chronic interstitial lung disease. Critical Value/emergent results were called by telephone at the time of interpretation on 01/10/2020 at 5:26 am to provider Ripley Fraise , who  verbally acknowledged these results. Electronically Signed   By: Ulyses Jarred M.D.   On: 01/10/2020  05:26     Assessment & Plan:   Acute pulmonary embolism [facility]) - Hospitalized in August 2021 for acute small bilateral pulmonary embolism. He is doing well, not on oxygen. No further hemoptysis.  - Continue Eliquis 5mg  twice daily until follow-up (minimum treatment 3 months or longer) - Ok to resume light physical activity- avoid heavy exertion or weight lifting for 4-6 weeks   Rx: - Refill Eliquis  Follow-up: - Early November with Dr. Halford Chessman   SARCOIDOSIS - Pulmonary embolism not felt to be related to sarcoidosis. He does have some scarring at lower lung base. He had very mild wheezing on exam and cough. Sending Rx for Albuterol hfa prn shortness of breath and will check CBC and ACE level. Do not suspect active sarcoidosis. May consider repeat imaging in 6 months to monitor.   Rx: - Albuterol 2 puffs every 4-6 hours for shortness of breath or wheezing   Orders: - Labs (ACE level and CBC)   Martyn Ehrich, NP 01/21/2020

## 2020-01-20 NOTE — Patient Instructions (Addendum)
Recommendation: - Continue Eliquis 5mg  twice daily until follow-up (minimum treatment 3 months or longer) - Ok to resume light physical activity- avoid heavy exertion or weight lifting for 4-6 weeks   Rx: - Refill Eliquis - Albuterol 2 puffs every 4-6 hours for shortness of breath or wheezing   Orders: - Labs (ACE level and CBC)  Follow-up: - Early November with Dr. Halford Chessman

## 2020-01-21 DIAGNOSIS — Z23 Encounter for immunization: Secondary | ICD-10-CM | POA: Diagnosis not present

## 2020-01-21 NOTE — Assessment & Plan Note (Signed)
-   Pulmonary embolism not felt to be related to sarcoidosis. He does have some scarring at lower lung base. He had very mild wheezing on exam and cough. Sending Rx for Albuterol hfa prn shortness of breath and will check CBC and ACE level. Do not suspect active sarcoidosis. May consider repeat imaging in 6 months to monitor.   Rx: - Albuterol 2 puffs every 4-6 hours for shortness of breath or wheezing   Orders: - Labs (ACE level and CBC)

## 2020-01-21 NOTE — Assessment & Plan Note (Addendum)
-   Hospitalized in August 2021 for acute small bilateral pulmonary embolism. He is doing well, not on oxygen. No further hemoptysis.  - Continue Eliquis 5mg  twice daily until follow-up (minimum treatment 3 months or longer) - Ok to resume light physical activity- avoid heavy exertion or weight lifting for 4-6 weeks   Rx: - Refill Eliquis  Follow-up: - 3 months with Dr. Halford Chessman

## 2020-01-21 NOTE — Progress Notes (Signed)
Reviewed and agree with assessment/plan.   Quanda Pavlicek, MD [facility] 01/21/2020, 10:52 AM Pager:  [phone]  

## 2020-01-22 LAB — ANGIOTENSIN CONVERTING ENZYME: Angiotensin-Converting Enzyme: 22 U/L (ref 9–67)

## 2020-01-22 NOTE — Progress Notes (Signed)
Spoke with patient and provided results per Derl Barrow NP.  Patient verbalized understanding.  Nothing further needed.

## 2020-01-22 NOTE — Progress Notes (Signed)
Please let patient know ACE level was normal. Active sarcoidosis not suspected. Hemoptysis was likely d/t PE/ pneumonia

## 2020-04-08 DIAGNOSIS — Z944 Liver transplant status: Secondary | ICD-10-CM | POA: Diagnosis not present

## 2020-04-08 DIAGNOSIS — D899 Disorder involving the immune mechanism, unspecified: Secondary | ICD-10-CM | POA: Diagnosis not present

## 2020-05-10 ENCOUNTER — Ambulatory Visit: Payer: Self-pay | Admitting: *Deleted

## 2020-06-09 NOTE — Progress Notes (Deleted)
Subjective:   Jeffery York is a 65 y.o. male who presents for Medicare Annual/Subsequent preventive examination.  Review of Systems    ***       Objective:    There were no vitals filed for this visit. There is no height or weight on file to calculate BMI.  Advanced Directives 05/09/2019 11/01/2018 11/29/2016 05/17/2016  Does Patient Have a Medical Advance Directive? Yes No Yes No  Type of Estate agent of [place];Living will - Living will -  Does patient want to make changes to medical advance directive? No - Patient declined - No - Patient declined -  Copy of Healthcare Power of Attorney in Chart? No - copy requested - - -  Would patient like information on creating a medical advance directive? - - - Yes (MAU/Ambulatory/Procedural Areas - Information given)    Current Medications (verified) Outpatient Encounter Medications as of 06/10/2020  Medication Sig  . albuterol (VENTOLIN HFA) 108 (90 Base) MCG/ACT inhaler Inhale 1-2 puffs into the lungs every 6 (six) hours as needed for wheezing or shortness of breath.  . allopurinol (ZYLOPRIM) 300 MG tablet Take 300 mg by mouth daily.   Marland Kitchen amLODipine (NORVASC) 10 MG tablet Take 1 tablet (10 mg total) by mouth daily.  Marland Kitchen apixaban (ELIQUIS) 5 MG TABS tablet Take 1 tablet (5 mg total) by mouth 2 (two) times daily.  Marland Kitchen ascorbic acid (VITAMIN C) 500 MG tablet Take 1,000 mg by mouth daily.   Marland Kitchen atorvastatin (LIPITOR) 10 MG tablet Take 5 mg by mouth every other day. Takes 4x/ week  . cetirizine (ZYRTEC) 10 MG tablet Take 10 mg by mouth daily.  . cholecalciferol (VITAMIN D) 1000 units tablet Take 1,000 Units by mouth daily.   . Coenzyme Q10 (COQ10) 100 MG CAPS Take 100 mg by mouth daily.  . Colchicine 0.6 MG CAPS Take 0.6 mg by mouth 2 (two) times daily as needed (gout flare). (Patient taking differently: Take 0.6 mg by mouth daily. )  . cyanocobalamin 1000 MCG tablet Take 1,000 mcg by mouth daily.   . fluticasone (FLONASE) 50  MCG/ACT nasal spray Place 2 sprays into both nostrils daily as needed for allergies.   Marland Kitchen glipiZIDE (GLUCOTROL) 5 MG tablet Take 2.5 mg by mouth in the morning and at bedtime.  . hydrochlorothiazide (HYDRODIURIL) 25 MG tablet Take 25 mg by mouth daily.  . insulin glargine (LANTUS) 100 UNIT/ML injection Inject 17.5 Units into the skin daily at 2 PM.  . JARDIANCE 25 MG TABS tablet Take 12.5 mg by mouth daily.  Marland Kitchen LEVEMIR FLEXTOUCH 100 UNIT/ML Pen 15 Units daily.   . Magnesium Oxide 400 (240 Mg) MG TABS Take 400 mg by mouth in the morning and at bedtime.   . Omega-3 Fatty Acids (FISH OIL PO) Take 1 capsule by mouth daily.  (Patient not taking: Reported on 01/20/2020)  . tacrolimus (PROGRAF) 1 MG capsule Take 2-3 mg by mouth See admin instructions. Take 3 capsules every morning and take 2 capsules every evening   No facility-administered encounter medications on file as of 06/10/2020.    Allergies (verified) Benazepril hcl, Erythromycin ethylsuccinate, and Keflex [cephalexin]   History: Past Medical History:  Diagnosis Date  . Benign prostatic hypertrophy   . Cirrhosis (HCC)   . Diabetes mellitus    initially 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 but then the diabetes resurface without the use of steroids  . HCC (hepatocellular carcinoma) (HCC)    s/p TACE and microwave ablation  . Hep  C w/o coma, chronic (HCC)    bx aprox 12-10, s/p Harvoni, on a transplant list candidate   . History of sarcoidosis    used steroids x 10 years  . Hyperlipidemia   . Hypertension   . OSA (obstructive sleep apnea) 01/15/2018  . 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 Uveitis    h/o   Past Surgical History:  Procedure Laterality Date  . LIVER TRANSPLANT  04-2015  . 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 Prostate cancer Brother        at age 69s  . Pancreatic cancer Brother   . CAD Brother   . Lung cancer Father   . Diabetes Other        father side   . Colon cancer Neg  Hx    Social History   Socioeconomic History  . Marital status: Married    Spouse name: Not on file  . Number of children: 0  . Years of education: Not on file  . Highest education level: Not on file  Occupational History  . Occupation: retired Nature conservation officer  Tobacco Use  . Smoking status: Former Smoker    Packs/day: 1.00    Years: 10.00    Pack years: 10.00    Quit date: 1990    Years since quitting: 32.0  . Smokeless tobacco: Never Used  . Tobacco comment: remote smoker  Vaping Use  . Vaping Use: Never used  Substance and Sexual Activity  . Alcohol use: No  . Drug use: No  . Sexual activity: Yes  Other Topics Concern  . Not on file  Social History Narrative   Mother- Lance Bosch one of my patients, deceased ~ Oct 05, 2011   Married, No children    Social Determinants of Health   Financial Resource Strain: Not on file  Food Insecurity: Not on file  Transportation Needs: Not on file  Physical Activity: Not on file  Stress: Not on file  Social Connections: Not on file    Tobacco Counseling Counseling given: Not Answered Comment: remote smoker   Clinical Intake:                 Diabetes:  Is the patient diabetic?  Yes  If diabetic, was a CBG obtained today?  No  Did the patient bring in their glucometer from home?  No  How often do you monitor your CBG's? ***.   Financial Strains and Diabetes Management:  Are you having any financial strains with the device, your supplies or your medication? {YES/NO:21197}.  Does the patient want to be seen by Chronic Care Management for management of their diabetes?  {YES/NO:21197} Would the patient like to be referred to a Nutritionist or for Diabetic Management?  {YES/NO:21197}  Diabetic Exams:  Diabetic Eye Exam: Completed ***. Overdue for diabetic eye exam. Pt has been advised about the importance in completing this exam. A referral has been placed today. Message sent to referral coordinator for scheduling purposes.  Advised pt to expect a call from our office re: appt.  Diabetic Foot Exam: Completed ***. Pt has been advised about the importance in completing this exam. Pt is scheduled for diabetic foot exam on ***.            Activities of Daily Living No flowsheet data found.  Patient Care Team: Colon Branch, MD as PCP - General Drazek, Dawn, CRNP as Nurse Practitioner (Nurse Practitioner) Charlie Pitter  S, MD as Referring Physician (Internal Medicine) Zamor, Ander Gaster, MD as Attending Physician (Internal Medicine) Genia Del, MD (Internal Medicine) Renato Shin, MD as Consulting Physician (Endocrinology)  Indicate any recent Medical Services you may have received from other than [facility] providers in the past year (date may be approximate).     Assessment:   This is a routine wellness examination for [place].  Hearing/Vision screen No exam data present  Dietary issues and exercise activities discussed:    Goals   None    Depression Screen PHQ 2/9 Scores 05/09/2019 08/02/2017 05/17/2016 12/13/2015 09/16/2015 05/21/2013 05/21/2013  PHQ - 2 Score 0 0 0 0 0 0 0    Fall Risk Fall Risk  05/09/2019 05/17/2016 12/13/2015 09/16/2015 05/21/2013  Falls in the past year? 0 No No No No    FALL RISK PREVENTION PERTAINING TO THE HOME:  Any stairs in or around the home? {YES/NO:21197} If so, are there any without handrails? {YES/NO:21197} Home free of loose throw rugs in walkways, pet beds, electrical cords, etc? {YES/NO:21197} Adequate lighting in your home to reduce risk of falls? {YES/NO:21197}  ASSISTIVE DEVICES UTILIZED TO PREVENT FALLS:  Life alert? {YES/NO:21197} Use of a cane, walker or w/c? {YES/NO:21197} Grab bars in the bathroom? {YES/NO:21197} Shower chair or bench in shower? {YES/NO:21197} Elevated toilet seat or a handicapped toilet? {YES/NO:21197}  TIMED UP AND GO:  Was the test performed? {YES/NO:21197}.  Length of time to ambulate 10 feet: *** sec.   {Appearance  of Gait:2101803}  Cognitive Function: MMSE - Mini Mental State Exam 05/17/2016  Orientation to time 5  Orientation to Place 5  Registration 3  Attention/ Calculation 5  Recall 3  Language- name 2 objects 2  Language- repeat 1  Language- follow 3 step command 3  Language- read & follow direction 1  Write a sentence 1  Copy design 1  Total score 30        Immunizations Immunization History  Administered Date(s) Administered  . H1N1 06/23/2008  . Hep A / Hep B 05/26/2013  . Influenza Split 05/05/2011  . Influenza Whole 04/16/2007, 03/09/2009, 04/05/2010  . Influenza, Seasonal, Injecte, Preservative Fre 04/30/2013  . Influenza,inj,Quad PF,6+ Mos 03/25/2018  . Influenza-Unspecified 02/04/2015, 02/04/2016  . Pneumococcal Conjugate-13 07/23/2014  . Pneumococcal Polysaccharide-23 03/05/2006, 05/21/2013  . Td 06/05/2002  . Tdap 05/21/2013  . Zoster 11/04/2014    TDAP status: Up to date  {Flu Vaccine status:2101806}  {Pneumococcal vaccine status:2101807}  {Covid-19 vaccine status:2101808}  Qualifies for Shingles Vaccine? Yes   Zostavax completed Yes   Shingrix Completed?: No.    Education has been provided regarding the importance of this vaccine. Patient has been advised to call insurance company to determine out of pocket expense if they have not yet received this vaccine. Advised may also receive vaccine at local pharmacy or Health Dept. Verbalized acceptance and understanding.  Screening Tests Health Maintenance  Topic Date Due  . COVID-19 Vaccine (1) Never done  . FOOT EXAM  09/15/2016  . OPHTHALMOLOGY EXAM  03/05/2018  . PNA vac Low Risk Adult (2 of 2 - PPSV23) 05/12/2019  . INFLUENZA VACCINE  01/04/2020  . HEMOGLOBIN A1C  07/12/2020  . TETANUS/TDAP  05/22/2023  . COLONOSCOPY (Pts 45-27yrs Insurance coverage will need to be confirmed)  07/13/2027  . Hepatitis C Screening  Completed    Health Maintenance  Health Maintenance Due  Topic Date Due  . COVID-19  Vaccine (1) Never done  . FOOT EXAM  09/15/2016  . OPHTHALMOLOGY  EXAM  03/05/2018  . PNA vac Low Risk Adult (2 of 2 - PPSV23) 05/12/2019  . INFLUENZA VACCINE  01/04/2020    Colorectal cancer screening: Type of screening: Colonoscopy. Completed 07/12/2017. Repeat every 10 years  Lung Cancer Screening: (Low Dose CT Chest recommended if Age 9-80 years, 30 pack-year currently smoking OR have quit w/in 15years.) does not qualify.    Additional Screening:  Hepatitis C Screening:  Completed 05/07/2015  Vision Screening: Recommended annual ophthalmology exams for early detection of glaucoma and other disorders of the eye. Is the patient up to date with their annual eye exam?  {YES/NO:21197} Who is the provider or what is the name of the office in which the patient attends annual eye exams? *** If pt is not established with a provider, would they like to be referred to a provider to establish care? {YES/NO:21197}.   Dental Screening: Recommended annual dental exams for proper oral hygiene  Community Resource Referral / Chronic Care Management: CRR required this visit?  {YES/NO:21197}  CCM required this visit?  {YES/NO:21197}     Plan:     I have personally reviewed and noted the following in the patient's chart:   . Medical and social history . Use of alcohol, tobacco or illicit drugs  . Current medications and supplements . Functional ability and status . Nutritional status . Physical activity . Advanced directives . List of other physicians . Hospitalizations, surgeries, and ER visits in previous 12 months . Vitals . Screenings to include cognitive, depression, and falls . Referrals and appointments  In addition, I have reviewed and discussed with patient certain preventive protocols, quality metrics, and best practice recommendations. A written personalized care plan for preventive services as well as general preventive health recommendations were provided to patient.      Marta Antu, LPN   [id number]  Nurse Health Advisor  Nurse Notes: ***

## 2020-06-10 ENCOUNTER — Telehealth: Payer: Self-pay

## 2020-06-10 ENCOUNTER — Ambulatory Visit: Payer: Self-pay

## 2020-06-10 NOTE — Telephone Encounter (Signed)
Patient was a no show for his appt today for his AWV. Left message for him to call back to reschedule.

## 2020-07-07 ENCOUNTER — Encounter: Payer: Self-pay | Admitting: Internal Medicine

## 2020-07-07 ENCOUNTER — Other Ambulatory Visit: Payer: Self-pay

## 2020-07-07 ENCOUNTER — Ambulatory Visit (INDEPENDENT_AMBULATORY_CARE_PROVIDER_SITE_OTHER): Payer: Medicare Other | Admitting: Internal Medicine

## 2020-07-07 VITALS — BP 136/78 | HR 71 | Temp 98.1°F | Resp 16 | Ht 68.0 in | Wt 172.6 lb

## 2020-07-07 DIAGNOSIS — S139XXA Sprain of joints and ligaments of unspecified parts of neck, initial encounter: Secondary | ICD-10-CM

## 2020-07-07 DIAGNOSIS — Z944 Liver transplant status: Secondary | ICD-10-CM | POA: Insufficient documentation

## 2020-07-07 MED ORDER — CYCLOBENZAPRINE HCL 10 MG PO TABS
10.0000 mg | ORAL_TABLET | Freq: Two times a day (BID) | ORAL | 0 refills | Status: DC | PRN
Start: 2020-07-07 — End: 2021-03-22

## 2020-07-07 NOTE — Patient Instructions (Addendum)
Per our records you are due for an eye exam. Please contact your eye doctor to schedule an appointment. Please have them send copies of your office visit notes to [place]. Our fax number is [phone].   Take Flexeril twice a day, it is a muscle relaxant, watch for feeling sleepy  Warm compresses  Take Tylenol 1 to 2 tablets twice daily as needed  Do not take any anti-inflammatory such as ibuprofen, naproxen  Call if not gradually better

## 2020-07-07 NOTE — Progress Notes (Signed)
Subjective:    Patient ID: Jeffery York, male    DOB: [birth year], 65 y.o.   MRN: [id number]  DOS:  07/07/2020 Type of visit - description: Last visit 11/07/2017, acute visit Symptoms a started 1 week ago: On and off pain at the right posterior neck, some radiation to the right shoulder. He denies any injury or fall. Pain increase with bending over or hyper extension of the neck. Does not change when he turns his head left or right.  He had a PE, currently anticoagulated. Denies hemoptysis. No chest pain or difficulty breathing. Denies any upper or lower extremity paresthesias. Gait is not impaired   Review of Systems See above   Past Medical History:  Diagnosis Date  . Benign prostatic hypertrophy   . Cirrhosis ([place])   . Diabetes mellitus    initially 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 but then the diabetes resurface without the use of steroids  . [place] (hepatocellular carcinoma) ([place])    s/p TACE and microwave ablation  . Hep C w/o coma, chronic (HCC)    bx aprox 12-10, s/p Harvoni, on a transplant list candidate   . History of sarcoidosis    used steroids x 10 years  . Hyperlipidemia   . Hypertension   . 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 OSA (obstructive sleep apnea) 01/15/2018  . 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 Uveitis    h/o    Past Surgical History:  Procedure Laterality Date  . LIVER TRANSPLANT  04-2015  . lung resection aspergilloma  2000    Allergies as of 07/07/2020      Reactions   Benazepril Hcl Swelling   angioedema   Erythromycin Ethylsuccinate    REACTION: GI Intolerance/vomiting   Keflex [cephalexin] Diarrhea   With cramps and Nausea      Medication List       Accurate as of July 07, 2020 11:59 PM. If you have any questions, ask your nurse or doctor.        STOP taking these medications   albuterol 108 (90 Base) MCG/ACT inhaler Commonly known as: Ventolin HFA Stopped by: Kathlene November, MD   FISH OIL PO Stopped by: Kathlene November, MD   Levemir  FlexTouch 100 UNIT/ML FlexPen Generic drug: insulin detemir Stopped by: Kathlene November, MD   Magnesium Oxide 400 (240 Mg) MG Tabs Stopped by: Kathlene November, MD     TAKE these medications   allopurinol 300 MG tablet Commonly known as: ZYLOPRIM Take 300 mg by mouth daily.   amLODipine 10 MG tablet Commonly known as: NORVASC Take 1 tablet (10 mg total) by mouth daily.   apixaban 5 MG Tabs tablet Commonly known as: ELIQUIS Take 1 tablet (5 mg total) by mouth 2 (two) times daily.   ascorbic acid 500 MG tablet Commonly known as: VITAMIN C Take 1,000 mg by mouth daily.   atorvastatin 10 MG tablet Commonly known as: LIPITOR Take 5 mg by mouth every other day. Takes 4x/ week   cetirizine 10 MG tablet Commonly known as: ZYRTEC Take 10 mg by mouth daily.   cholecalciferol 1000 units tablet Commonly known as: VITAMIN D Take 1,000 Units by mouth daily.   Colchicine 0.6 MG Caps Take 0.6 mg by mouth 2 (two) times daily as needed (gout flare). What changed: when to take this   CoQ10 100 MG Caps Take 100 mg by mouth daily.   cyanocobalamin 1000 MCG tablet Take 1,000 mcg by mouth daily.   cyclobenzaprine  10 MG tablet Commonly known as: FLEXERIL Take 1 tablet (10 mg total) by mouth 2 (two) times daily as needed for muscle spasms. Started by: Kathlene November, MD   fluticasone 50 MCG/ACT nasal spray Commonly known as: FLONASE Place 2 sprays into both nostrils daily as needed for allergies.   glipiZIDE 5 MG tablet Commonly known as: GLUCOTROL Take 2.5 mg by mouth in the morning and at bedtime.   hydrochlorothiazide 25 MG tablet Commonly known as: HYDRODIURIL Take 25 mg by mouth daily.   insulin glargine 100 UNIT/ML injection Commonly known as: LANTUS Inject 17.5 Units into the skin daily at 2 PM.   Jardiance 25 MG Tabs tablet Generic drug: empagliflozin Take 12.5 mg by mouth daily.   magnesium oxide 400 MG tablet Commonly known as: MAG-OX Take 1 tablet by mouth 2 (two) times  daily.   tacrolimus 1 MG capsule Commonly known as: PROGRAF Take 2-3 mg by mouth See admin instructions. Take 3 capsules every morning and take 2 capsules every evening          Objective:   Physical Exam BP 136/78   Pulse 71   Temp 98.1 F (36.7 C)   Resp 16   Ht 5\' 8"  (1.727 m)   Wt 172 lb 9.6 oz (78.3 kg)   SpO2 95%   BMI 26.24 kg/m  General:   Well developed, NAD, BMI noted. HEENT:  Normocephalic . Face symmetric, atraumatic Neck: Range of motion normal with minimal discomfort when I asked him to hyperextend the neck. Cervical spine: No TTP Lungs:  CTA B Normal respiratory effort, no intercostal retractions, no accessory muscle use. Heart: RRR,  no murmur.  Lower extremities: no pretibial edema bilaterally  Skin: Not pale. Not jaundice Neurologic:  alert & oriented X3.  Speech normal, gait appropriate for age and unassisted Motor and DTR symmetric Psych--  Cognition and judgment appear intact.  Cooperative with normal attention span and concentration.  Behavior appropriate. No anxious or depressed appearing.      Assessment      Assessment  DM  -- Initially 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 then it resurfaced w/o steroids HTN Hyperlipidemia Gout BPH-- failed Flomax before ED ?Osteoporosis steroid-induced: dexa wnl 2010 H/o uveitis Sarcoidosis s/p steroids for 10 years  GI:  f/u at [facility]  -- Liver transplant  04-07-2015 @ [facility]   --Hep C: S/p harvoni failure   --Cirrhosis --Hepatocellular ca, s/p TACE- microwave ablation --> failed, had chemo embolization 03-2015  --Reportedly get hepatitis A-B shots from GI  Acute PE 01-2020   PLAN: Neck sprain: Acute visit for neck pain, history is consistent with a sprain, no clinical evidence of a radiculopathy. Recommend conservative treatment with Flexeril, warm compress and occasional Tylenol. Call if not gradually better, we could try a round of prednisone and/or refer to Ortho. Acute PE 01-2020, h/o  sarcoidosis. Saw pulmonary 01/20/2020, they recommended anticoagulation for at a minimum 3 months or longer.They did not suspect active sarcoid. He is a still anticoagulated, plans to see the hematologist at the [facility] & Dr. Halford Chessman here in town. Preventive care: Get his regular care at the [facility] He did report saw dermatology last year at the [place] (he is at increased risk of skin cancer due to immunosuppression)    This visit occurred during the SARS-CoV-2 public health emergency.  Safety protocols were in place, including screening questions prior to the visit, additional usage of staff PPE, and extensive cleaning of exam room while observing appropriate contact time as indicated  for disinfecting solutions.

## 2020-07-07 NOTE — Progress Notes (Signed)
Pre visit review using our clinic review tool, if applicable. No additional management support is needed unless otherwise documented below in the visit note. 

## 2020-07-08 NOTE — Assessment & Plan Note (Signed)
Neck sprain: Acute visit for neck pain, history is consistent with a sprain, no clinical evidence of a radiculopathy. Recommend conservative treatment with Flexeril, warm compress and occasional Tylenol. Call if not gradually better, we could try a round of prednisone and/or refer to Ortho. Acute PE 01-2020, h/o sarcoidosis. Saw pulmonary 01/20/2020, they recommended anticoagulation for at a minimum 3 months or longer.They did not suspect active sarcoid. He is a still anticoagulated, plans to see the hematologist at the [facility] & Dr. Halford Chessman here in town. Preventive care: Get his regular care at the [facility] He did report saw dermatology last year at the [place] (he is at increased risk of skin cancer due to immunosuppression)

## 2020-07-29 ENCOUNTER — Telehealth: Payer: Self-pay

## 2020-07-29 NOTE — Telephone Encounter (Signed)
Advised patient to bring SD card from CPAP machine for visit tomorrow. Nothing further needed.

## 2020-07-30 ENCOUNTER — Encounter: Payer: Self-pay | Admitting: Pulmonary Disease

## 2020-07-30 ENCOUNTER — Other Ambulatory Visit: Payer: Self-pay

## 2020-07-30 ENCOUNTER — Ambulatory Visit (INDEPENDENT_AMBULATORY_CARE_PROVIDER_SITE_OTHER): Payer: Medicare Other | Admitting: Pulmonary Disease

## 2020-07-30 VITALS — BP 128/80 | HR 68 | Temp 97.8°F | Ht 68.0 in | Wt 174.0 lb

## 2020-07-30 DIAGNOSIS — Z9989 Dependence on other enabling machines and devices: Secondary | ICD-10-CM

## 2020-07-30 DIAGNOSIS — G4733 Obstructive sleep apnea (adult) (pediatric): Secondary | ICD-10-CM

## 2020-07-30 DIAGNOSIS — D869 Sarcoidosis, unspecified: Secondary | ICD-10-CM

## 2020-07-30 NOTE — Patient Instructions (Signed)
Will arrange for high resolution CT chest  Will have [employer] change your auto CPAP to 5 - 15 cm H2O  Follow up in 6 to 8 weeks

## 2020-07-30 NOTE — Progress Notes (Signed)
[facility]  Chief Complaint  Patient presents with  . 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  Brief Summary:  Jeffery York is a 65 y.o. male former smoker with obstructive sleep apnea. He has hx of pulmonary sarcoidosis dx in 1990, s/p Rt upper lobectomy for aspergilloma in 2000.      Subjective:   He was in hospital in August 2021 with hemoptysis and found to have GGO and PE.  ACE level was normal.  No longer coughing up blood.  Still has chest discomfort.  Notices more trouble with daytime fatigue.  Wakes up every couple of hours and then can have trouble falling back to sleep.  Physical Exam:   Appearance - well kempt   ENMT - no sinus tenderness, no oral exudate, no LAN, Mallampati 3 airway, no stridor  Respiratory - equal breath sounds bilaterally, no wheezing or rales  CV - s1s2 regular rate and rhythm, no murmurs  Ext - no clubbing, no edema  Skin - no rashes  Psych - normal mood and affect   Pulmonary testing:   ACE 01/20/20 >> 22  Chest Imaging:   CT chest 03/08/17 >> extensive scarring, s/p RULectomy  CT angio chest 01/10/20 >> small PE in LUL and RML, GGO in RLL, s/p Rt upper lobectomy, apical scarring  Sleep Tests:   HST 01/11/18 >> AHI 19.9, SaO2 low 78%  Auto CPAP 05/02/20 to 07/30/20 >> used on 89 of 90 nights with average 7 hrs 34 min.  Average AHI 3.9 with median CPAP 8 and 95 th percentile CPAP 10 cm H2O  Cardiac Tests:     Social History:  He  reports that he quit smoking about 32 years ago. He has a 10.00 pack-year smoking history. He has never used smokeless tobacco. He reports that he does not drink alcohol and does not use drugs.  Family History:  His family history includes CAD in his brother; Diabetes in an other family member; Lung cancer in his father; Pancreatic cancer in his brother; Prostate cancer in his brother.     Assessment/Plan:   History of sarcoidosis. - he had episode of hemoptysis with ground glass opacification and pulmonary embolism in August 2021; this was on background of chronic changes from sarcoidosis and right upper lobectomy - he still has episodes of chest discomfort - will arrange for high resolution CT chest  Unprovoked pulmonary embolism from August 2021. - continue on eliquis  Obstructive sleep apnea. - he is compliant with CPAP and reports benefit - uses [employer] for his DME - he has persistent daytime fatigue - might need higher auto CPAP range - will change auto CPAP to 5 to 15 cm H2O  S/p liver transplant for hx of hepatitis C with cirrhosis. - followed at [place]  Time Spent Involved in Patient Care on Day of Examination:  34 minutes  Follow up:  Patient Instructions  Will arrange for high resolution CT chest  Will have [employer] change your auto CPAP to 5 - 15 cm H2O  Follow up in 6 to 8 weeks   Medication List:   Allergies as of 07/30/2020      Reactions   Benazepril Hcl Swelling   angioedema   Erythromycin Ethylsuccinate    REACTION: GI Intolerance/vomiting   Keflex [cephalexin] Diarrhea   With cramps and Nausea      Medication List       Accurate as of July 30, 2020  5:21 PM. If you have any questions, ask your nurse or doctor.        allopurinol 300 MG tablet Commonly known as: ZYLOPRIM Take 300 mg by mouth daily.   amLODipine 10 MG tablet Commonly known as: NORVASC Take 1 tablet (10 mg total) by mouth daily.    apixaban 5 MG Tabs tablet Commonly known as: ELIQUIS Take 1 tablet (5 mg total) by mouth 2 (two) times daily.   ascorbic acid 500 MG tablet Commonly known as: VITAMIN C Take 1,000 mg by mouth daily.   atorvastatin 10 MG tablet Commonly known as: LIPITOR Take 5 mg by mouth every other day. Takes 4x/ week   cetirizine 10 MG tablet Commonly known as: ZYRTEC Take 10 mg by mouth daily.   cholecalciferol 1000 units tablet Commonly known as: VITAMIN D Take 1,000 Units by mouth daily.   Colchicine 0.6 MG Caps Take 0.6 mg by mouth 2 (two) times daily as needed (gout flare). What changed: when to take this   CoQ10 100 MG Caps Take 100 mg by mouth daily.   cyanocobalamin 1000 MCG tablet Take 1,000 mcg by mouth daily.   cyclobenzaprine 10 MG tablet Commonly known as: FLEXERIL Take 1 tablet (10 mg total) by mouth 2 (two) times daily as needed for muscle spasms.   fluticasone 50 MCG/ACT nasal spray Commonly known as: FLONASE Place 2 sprays into both nostrils daily as needed for allergies.   glipiZIDE 5 MG tablet Commonly known as: GLUCOTROL Take 2.5 mg by mouth in the morning and at bedtime.   hydrochlorothiazide 25 MG tablet Commonly known as: HYDRODIURIL Take 25 mg by mouth daily.   insulin glargine 100 UNIT/ML injection Commonly known as: LANTUS Inject 17.5 Units into the skin daily at 2 PM.   Jardiance 25 MG Tabs tablet Generic drug: empagliflozin Take 12.5 mg by mouth daily.   magnesium oxide 400 MG tablet Commonly known as: MAG-OX Take 1 tablet by mouth 2 (two) times daily.   tacrolimus 1 MG capsule Commonly known as: PROGRAF Take 2-3 mg by mouth See admin instructions. Take 3 capsules every morning and take 2 capsules every evening       Signature:  Chesley Mires, MD [place] Pager - [phone] 07/30/2020, 5:21 PM

## 2020-08-13 ENCOUNTER — Ambulatory Visit
Admission: RE | Admit: 2020-08-13 | Discharge: 2020-08-13 | Disposition: A | Discharge status: Home / Self Care | Payer: Medicare Other | Source: Ambulatory Visit | Attending: Pulmonary Disease | Admitting: Pulmonary Disease

## 2020-08-13 DIAGNOSIS — D8689 Sarcoidosis of other sites: Secondary | ICD-10-CM | POA: Diagnosis not present

## 2020-08-13 DIAGNOSIS — I2510 Atherosclerotic heart disease of native coronary artery without angina pectoris: Secondary | ICD-10-CM | POA: Diagnosis not present

## 2020-08-13 DIAGNOSIS — D869 Sarcoidosis, unspecified: Secondary | ICD-10-CM

## 2020-08-13 DIAGNOSIS — Z8505 Personal history of malignant neoplasm of liver: Secondary | ICD-10-CM | POA: Diagnosis not present

## 2020-08-13 DIAGNOSIS — Z944 Liver transplant status: Secondary | ICD-10-CM | POA: Diagnosis not present

## 2020-08-17 ENCOUNTER — Other Ambulatory Visit: Payer: Medicare Other

## 2020-08-17 DIAGNOSIS — Z944 Liver transplant status: Secondary | ICD-10-CM | POA: Diagnosis not present

## 2020-08-17 DIAGNOSIS — D899 Disorder involving the immune mechanism, unspecified: Secondary | ICD-10-CM | POA: Diagnosis not present

## 2020-08-18 ENCOUNTER — Telehealth: Payer: Self-pay | Admitting: Pulmonary Disease

## 2020-08-18 NOTE — Telephone Encounter (Signed)
Jeffery Mires, MD  08/16/2020 1:53 PM EDT      Please let him know his CT chest shows stable findings from history of sarcoidosis. Will review in more detail at his next visit.   Called and spoke with patient. He verbalized understanding. Reminded him of his appt scheduled for May 2nd. He was concerned about the report of resolved right sided pneumonia. He was not aware that he had PNA. He has noticed a dull pain on his right side behind his lung.   He also wanted to discuss the cardiovascular portion of the CT scan. I advised him to follow up with his PCP as I would forward the results to Dr. Larose Kells.   Nothing further needed at time of call.

## 2020-08-18 NOTE — Progress Notes (Signed)
Called and left message on voicemail to please return phone call to go over CT results. Contact number provided.

## 2020-08-30 ENCOUNTER — Ambulatory Visit (INDEPENDENT_AMBULATORY_CARE_PROVIDER_SITE_OTHER): Payer: Medicare Other | Admitting: Internal Medicine

## 2020-08-30 ENCOUNTER — Encounter: Payer: Self-pay | Admitting: Internal Medicine

## 2020-08-30 ENCOUNTER — Other Ambulatory Visit: Payer: Self-pay

## 2020-08-30 VITALS — BP 164/84 | HR 66 | Temp 97.7°F | Ht 68.0 in | Wt 174.4 lb

## 2020-08-30 DIAGNOSIS — E785 Hyperlipidemia, unspecified: Secondary | ICD-10-CM | POA: Diagnosis not present

## 2020-08-30 DIAGNOSIS — Z794 Long term (current) use of insulin: Secondary | ICD-10-CM | POA: Diagnosis not present

## 2020-08-30 DIAGNOSIS — N401 Enlarged prostate with lower urinary tract symptoms: Secondary | ICD-10-CM

## 2020-08-30 DIAGNOSIS — M545 Low back pain, unspecified: Secondary | ICD-10-CM | POA: Diagnosis not present

## 2020-08-30 DIAGNOSIS — E119 Type 2 diabetes mellitus without complications: Secondary | ICD-10-CM | POA: Diagnosis not present

## 2020-08-30 DIAGNOSIS — I10 Essential (primary) hypertension: Secondary | ICD-10-CM

## 2020-08-30 LAB — URINALYSIS, ROUTINE W REFLEX MICROSCOPIC
Bilirubin Urine: NEGATIVE
Ketones, ur: NEGATIVE
Leukocytes,Ua: NEGATIVE
Nitrite: NEGATIVE
Specific Gravity, Urine: 1.015 (ref 1.000–1.030)
Total Protein, Urine: 100 — AB
Urine Glucose: >=1000 — AB
Urobilinogen, UA: 0.2 (ref 0.0–1.0)
pH: 7 (ref 5.0–8.0)

## 2020-08-30 LAB — LIPID PANEL
Cholesterol: 119 mg/dL (ref 0–200)
HDL: 35.9 mg/dL — ABNORMAL LOW (ref >39.00)
LDL Cholesterol: 65 mg/dL (ref 0–99)
NonHDL: 83.33
Total CHOL/HDL Ratio: 3
Triglycerides: 93 mg/dL (ref 0.0–149.0)
VLDL: 18.6 mg/dL (ref 0.0–40.0)

## 2020-08-30 LAB — COMPREHENSIVE METABOLIC PANEL
ALT: 23 U/L (ref 0–53)
AST: 22 U/L (ref 0–37)
Albumin: 4.2 g/dL (ref 3.5–5.2)
Alkaline Phosphatase: 90 U/L (ref 39–117)
BUN: 23 mg/dL (ref 6–23)
CO2: 33 mEq/L — ABNORMAL HIGH (ref 19–32)
Calcium: 9.7 mg/dL (ref 8.4–10.5)
Chloride: 100 mEq/L (ref 96–112)
Creatinine, Ser: 1.04 mg/dL (ref 0.40–1.50)
GFR: 74.93 mL/min (ref >60.00)
Glucose, Bld: 102 mg/dL — ABNORMAL HIGH (ref 70–99)
Potassium: 4.4 mEq/L (ref 3.5–5.1)
Sodium: 138 mEq/L (ref 135–145)
Total Bilirubin: 0.9 mg/dL (ref 0.2–1.2)
Total Protein: 8.2 g/dL (ref 6.0–8.3)

## 2020-08-30 LAB — PSA: PSA: 6.72 ng/mL — ABNORMAL HIGH (ref 0.10–4.00)

## 2020-08-30 LAB — HEMOGLOBIN A1C: Hgb A1c MFr Bld: 6.5 % (ref 4.6–6.5)

## 2020-08-30 NOTE — Progress Notes (Signed)
Subjective:    Patient ID: Jeffery York, male    DOB: 1956/03/18, 65 y.o.   MRN: [id number]  DOS:  08/30/2020 Type of visit - description: ROV Here for a checkup. Ambulatory BPs in the mornings are slightly elevated in the 150s, after he takes his medications & exercises BPs are better in the 120s. Having right-sided low back pain on and off, started about 6 weeks ago, occasional radiation to the buttocks.  Denies any right-sided leg pain or leg numbness. Pain increases or decreases depending on posture.   BP Readings from Last 3 Encounters:  08/30/20 (!) 164/84  07/30/20 128/80  07/07/20 136/78     Review of Systems No fever chills Did report nocturia times 4 to 5 times at night, some urgency.  No gross hematuria  Past Medical History:  Diagnosis Date  . Benign prostatic hypertrophy   . Cirrhosis ([place])   . Diabetes mellitus    initially 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 but then the diabetes resurface without the use of steroids  . [place] (hepatocellular carcinoma) ([place])    s/p TACE and microwave ablation  . Hep C w/o coma, chronic (HCC)    bx aprox 12-10, s/p Harvoni, on a transplant list candidate   . History of sarcoidosis    used steroids x 10 years  . Hyperlipidemia   . Hypertension   . Liver transplant recipient [facility]) 2016  . OSA (obstructive sleep apnea) 01/15/2018  . 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 Uveitis    h/o    Past Surgical History:  Procedure Laterality Date  . LIVER TRANSPLANT  04-2015  . lung resection aspergilloma  2000    Allergies as of 08/30/2020      Reactions   Benazepril Hcl Swelling   angioedema   Erythromycin Ethylsuccinate    REACTION: GI Intolerance/vomiting   Keflex [cephalexin] Diarrhea   With cramps and Nausea      Medication List       Accurate as of August 30, 2020  4:55 PM. If you have any questions, ask your nurse or doctor.        STOP taking these medications   glipiZIDE 5 MG tablet Commonly known as: GLUCOTROL Stopped  by: Kathlene November, MD     TAKE these medications   allopurinol 300 MG tablet Commonly known as: ZYLOPRIM Take 300 mg by mouth daily.   amLODipine 10 MG tablet Commonly known as: NORVASC Take 1 tablet (10 mg total) by mouth daily.   apixaban 2.5 MG Tabs tablet Commonly known as: ELIQUIS Take 1 tablet (2.5 mg total) by mouth 2 (two) times daily. What changed: Another medication with the same name was removed. Continue taking this medication, and follow the directions you see here. Changed by: Kathlene November, MD   ascorbic acid 500 MG tablet Commonly known as: VITAMIN C Take 1,000 mg by mouth daily.   atorvastatin 10 MG tablet Commonly known as: LIPITOR Take 1 tablet (10 mg total) by mouth every other day. What changed:   how much to take  additional instructions Changed by: Kathlene November, MD   cetirizine 10 MG tablet Commonly known as: ZYRTEC Take 10 mg by mouth daily.   cholecalciferol 1000 units tablet Commonly known as: VITAMIN D Take 1,000 Units by mouth daily.   Colchicine 0.6 MG Caps Take 0.6 mg by mouth 2 (two) times daily as needed (gout flare). What changed:   when to take this  additional instructions   CoQ10  100 MG Caps Take 100 mg by mouth daily.   cyanocobalamin 1000 MCG tablet Take 1,000 mcg by mouth daily.   cyclobenzaprine 10 MG tablet Commonly known as: FLEXERIL Take 1 tablet (10 mg total) by mouth 2 (two) times daily as needed for muscle spasms.   fluticasone 50 MCG/ACT nasal spray Commonly known as: FLONASE Place 2 sprays into both nostrils daily as needed for allergies.   hydrochlorothiazide 25 MG tablet Commonly known as: HYDRODIURIL Take 25 mg by mouth daily.   insulin glargine 100 UNIT/ML injection Commonly known as: LANTUS Inject 17 Units into the skin daily at 2 PM. Currently 21 units (17-20 uts can change)   Jardiance 25 MG Tabs tablet Generic drug: empagliflozin Take 12.5 mg by mouth daily.   magnesium oxide 400 MG tablet Commonly  known as: MAG-OX Take 1 tablet by mouth 2 (two) times daily.   tacrolimus 1 MG capsule Commonly known as: PROGRAF Take 2-3 mg by mouth See admin instructions. Take 3 capsules every morning and take 2 capsules every evening          Objective:   Physical Exam BP (!) 164/84 (BP Location: Right Arm, Patient Position: Sitting, Cuff Size: Large)   Pulse 66   Temp 97.7 F (36.5 C) (Temporal)   Ht 5\' 8"  (1.727 m)   Wt 174 lb 6.4 oz (79.1 kg)   SpO2 97%   BMI 26.52 kg/m  General: Well developed, NAD, BMI noted Neck: No  thyromegaly  HEENT:  Normocephalic . Face symmetric, atraumatic Lungs:  CTA B Normal respiratory effort, no intercostal retractions, no accessory muscle use. Heart: RRR,  no murmur.  Abdomen:  Not distended, soft, non-tender. No rebound or rigidity. MSK: No TTP at the lumbosacral spine or SI joint Lower extremities: no pretibial edema bilaterally  Skin: Exposed areas without rash. Not pale. Not jaundice DRE: Mildly tender prostate on the right?  Not nodular, size is slightly increased, shape symmetric. Neurologic:  alert & oriented X3.  Speech normal, gait appropriate for age and unassisted Strength symmetric and appropriate for age.  Psych: Cognition and judgment appear intact.  Cooperative with normal attention span and concentration.  Behavior appropriate. No anxious or depressed appearing.     Assessment    ASSESSMENT DM : Initially 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 then it resurfaced w/o steroids HTN Hyperlipidemia Gout BPH-- failed Flomax before OSA: On CPAP ED ?Osteoporosis steroid-induced: dexa wnl 2010 H/o uveitis Sarcoidosis s/p steroids for 10 years  GI:  f/u at [place]  -- Liver transplant  04-07-2015 @ [facility]   --Hep C: S/p harvoni failure   --Hepatocellular ca, s/p TACE- microwave ablation --> failed, had chemo embolization 03-2015  --Reportedly get hepatitis A-B shots from GI  Acute PE 01-2020   PLAN: Here for a checkup -unclear to me if  he plans to continue going to the [place] but I recommend him to do. - He brought approximately 2500 pages of records of the [place], recommend to keep them at home for future reference. DM: Last A1c at the [place] few weeks ago was 6.5, glipizide discontinued, currently on Lantus 17 units, Jardiance 25.  Check A1c HTN: On amlodipine, HCTZ, BP slightly elevated in the mornings but in the afternoon is normal. Rec to take amlodipine at night, continue monitoring. Hyperlipidemia: On Lipitor 10 mg every other day, checking labs OSA: Per pulmonary Sarcoidosis: Per pulmonary History of PE: Saw hematology recently at the [facility], per patient, they rec  long-term low-dose Eliquis, f/u w/ them prn  Liver transplant: Per [facility] LUTS/BPH: as described above, DRE shows slightly tender right side of the prostate.  Check a PSA, UA urine culture.  He was seen by urology [place] in 2019.  Consider referral to urology. Back pain: As described above, sports medicine referral Preventive care reviewed RTC 6 to 8 months   This visit occurred during the SARS-CoV-2 public health emergency.  Safety protocols were in place, including screening questions prior to the visit, additional usage of staff PPE, and extensive cleaning of exam room while observing appropriate contact time as indicated for disinfecting solutions.

## 2020-08-30 NOTE — Assessment & Plan Note (Signed)
Preventive care reviewed: Gets care regularly at the [facility] system -Td 2014 -Shingrix discussed -  pneumonia shot 2007-2014; prevnar-- 2016 -COVID VAX x3 -Had a flu shot this season -prostate ca screening: DRE and PSA today, see comments under LUTS - CCS: Cscope 10-2008, diverticuli cscope at the [place] 07-12-2017, 1 polyps, 10 years ? - advance directives discussed.

## 2020-08-30 NOTE — Assessment & Plan Note (Signed)
Here for a checkup -unclear to me if he plans to continue going to the [place] but I recommend him to do. - He brought approximately 2500 pages of records of the [place], recommend to keep them at home for future reference. DM: Last A1c at the [place] few weeks ago was 6.5, glipizide discontinued, currently on Lantus 17 units, Jardiance 25.  Check A1c HTN: On amlodipine, HCTZ, BP slightly elevated in the mornings but in the afternoon is normal. Rec to take amlodipine at night, continue monitoring. Hyperlipidemia: On Lipitor 10 mg every other day, checking labs OSA: Per pulmonary Sarcoidosis: Per pulmonary History of PE: Saw hematology recently at the [facility], per patient, they rec  long-term low-dose Eliquis, f/u w/ them prn Liver transplant: Per [facility] LUTS/BPH: as described above, DRE shows slightly tender right side of the prostate.  Check a PSA, UA urine culture.  He was seen by urology [place] in 2019.  Consider referral to urology. Back pain: As described above, sports medicine referral Preventive care reviewed RTC 6 to 8 months

## 2020-08-30 NOTE — Patient Instructions (Addendum)
Per our records you are due for an eye exam. Please contact your eye doctor to schedule an appointment. Please have them send copies of your office visit notes to [place]. Our fax number is [phone].  Consider Shingrix, vaccine against shingles   Check the  blood pressure  daily BP GOAL is between 110/65 and  135/85. If it is consistently higher or lower, let me know    GO TO THE LAB : Get the blood work     [place], [place] back for a checkup in 6 months    Advanced care planning ("Living will", "[place] of attorney")  Advance care planning is a process that supports adults in  understanding and sharing their preferences regarding future medical care.   The patient's preferences are recorded in documents called Advance Directives.    Advanced directives are completed (and can be modified at any time) while the patient is in full mental capacity.   The documentation should be available at all times to the patient, the family and the healthcare providers.  Bring in a copy to be scanned in your chart is an excellent idea and is recommended   This legal documents direct treatment decision making and/or appoint a surrogate to make the decision if the patient is not capable to do so.    Advance directives can be documented in many types of formats,  documents have names such as:  Lliving will  Durable power of attorney for healthcare (healthcare proxy or healthcare power of attorney)  Combined directives  Physician orders for life-sustaining treatment    More information at:  [email or web address]

## 2020-08-31 LAB — URINE CULTURE
MICRO NUMBER:: 11699458
SPECIMEN QUALITY:: ADEQUATE

## 2020-09-02 ENCOUNTER — Other Ambulatory Visit: Payer: Self-pay

## 2020-09-02 DIAGNOSIS — R972 Elevated prostate specific antigen [PSA]: Secondary | ICD-10-CM

## 2020-09-10 ENCOUNTER — Ambulatory Visit (INDEPENDENT_AMBULATORY_CARE_PROVIDER_SITE_OTHER): Payer: Medicare Other | Admitting: Family Medicine

## 2020-09-10 ENCOUNTER — Ambulatory Visit (HOSPITAL_BASED_OUTPATIENT_CLINIC_OR_DEPARTMENT_OTHER)
Admission: RE | Admit: 2020-09-10 | Discharge: 2020-09-10 | Disposition: A | Discharge status: Home / Self Care | Payer: Medicare Other | Source: Ambulatory Visit | Attending: Family Medicine | Admitting: Family Medicine

## 2020-09-10 ENCOUNTER — Encounter: Payer: Self-pay | Admitting: Family Medicine

## 2020-09-10 ENCOUNTER — Other Ambulatory Visit: Payer: Self-pay

## 2020-09-10 VITALS — BP 142/80 | Ht 68.0 in | Wt 168.0 lb

## 2020-09-10 DIAGNOSIS — M10271 Drug-induced gout, right ankle and foot: Secondary | ICD-10-CM

## 2020-09-10 DIAGNOSIS — M545 Low back pain, unspecified: Secondary | ICD-10-CM | POA: Diagnosis not present

## 2020-09-10 DIAGNOSIS — Z8709 Personal history of other diseases of the respiratory system: Secondary | ICD-10-CM | POA: Diagnosis not present

## 2020-09-10 DIAGNOSIS — M533 Sacrococcygeal disorders, not elsewhere classified: Secondary | ICD-10-CM | POA: Diagnosis not present

## 2020-09-10 NOTE — Assessment & Plan Note (Signed)
Has a history of gout as well as sarcoidosis.  He is fairly active but reports intermittent arthralgias.  Concerned it may be inflammatory in nature. -Counseled on home exercise therapy and supportive care. -Uric acid, sed rate, CRP, ANA panel.

## 2020-09-10 NOTE — Progress Notes (Signed)
Jeffery York - 65 y.o. male MRN [id number]  Date of birth: 1955-06-30  SUBJECTIVE:  Including CC & ROS.  No chief complaint on file.   Jeffery York is a 65 y.o. male that is presenting with acute on chronic low back pain as well as arthralgias of multiple joints.  He has a history of pulmonary embolism as well as sarcoidosis.  He also has a liver transplant recipient of the liver.  He is on Prograf.  He reports having an elevated PSA.  Has concern of his low back pain may be contributed from that.  He is fairly active and does a lot of stretching and flexibility exercises.  Symptoms seem to be worse inconsistently and has multiple joints affected.   Review of Systems See HPI   HISTORY: Past Medical, Surgical, Social, and Family History Reviewed & Updated per EMR.   Pertinent Historical Findings include:  Past Medical History:  Diagnosis Date  . Benign prostatic hypertrophy   . Cirrhosis ([place])   . Diabetes mellitus    initially 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 but then the diabetes resurface without the use of steroids  . [place] (hepatocellular carcinoma) ([place])    s/p TACE and microwave ablation  . Hep C w/o coma, chronic (HCC)    bx aprox 12-10, s/p Harvoni, on a transplant list candidate   . History of sarcoidosis    used steroids x 10 years  . Hyperlipidemia   . Hypertension   . Liver transplant recipient [facility]) Sep 18, 2014  . OSA (obstructive sleep apnea) 01/15/2018  . 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 Uveitis    h/o    Past Surgical History:  Procedure Laterality Date  . LIVER TRANSPLANT  04-2015  . lung resection aspergilloma  September 18, 1998    Family History  Problem Relation Age of Onset  . Prostate cancer Brother        at age 76s  . Pancreatic cancer Brother   . CAD Brother   . Lung cancer Father   . Diabetes Other        father side   . Colon cancer Neg Hx     Social History   Socioeconomic History  . Marital status: Married    Spouse name: Not on file  . Number of children:  0  . Years of education: Not on file  . Highest education level: Not on file  Occupational History  . Occupation: retired Nature conservation officer  Tobacco Use  . Smoking status: Former Smoker    Packs/day: 1.00    Years: 10.00    Pack years: 10.00    Quit date: 1990    Years since quitting: 32.2  . Smokeless tobacco: Never Used  . Tobacco comment: remote smoker  Vaping Use  . Vaping Use: Never used  Substance and Sexual Activity  . Alcohol use: No  . Drug use: No  . Sexual activity: Yes  Other Topics Concern  . Not on file  Social History Narrative   Mother- Lance Bosch one of my patients, deceased ~ 09/18/2011   Married, No children    Social Determinants of Health   Financial Resource Strain: Not on file  Food Insecurity: Not on file  Transportation Needs: Not on file  Physical Activity: Not on file  Stress: Not on file  Social Connections: Not on file  Intimate Partner Violence: Not on file     PHYSICAL EXAM:  VS: BP (!) 142/80 (BP Location: Right Arm, Patient Position: Sitting,  Cuff Size: Normal)   Ht 5\' 8"  (1.727 m)   Wt 168 lb (76.2 kg)   BMI 25.54 kg/m  Physical Exam Gen: NAD, alert, cooperative with exam, well-appearing   ASSESSMENT & PLAN:   Gout Has a history of gout as well as sarcoidosis.  He is fairly active but reports intermittent arthralgias.  Concerned it may be inflammatory in nature. -Counseled on home exercise therapy and supportive care. -Uric acid, sed rate, CRP, ANA panel.  Acute bilateral low back pain without sciatica Has good strength and flexibility.  Unclear if his pain is more structural as opposed inflammatory. -Counseled on home exercise therapy and supportive care. -X-ray. -Could consider physical therapy or further imaging.

## 2020-09-10 NOTE — Assessment & Plan Note (Signed)
Has good strength and flexibility.  Unclear if his pain is more structural as opposed inflammatory. -Counseled on home exercise therapy and supportive care. -X-ray. -Could consider physical therapy or further imaging.

## 2020-09-10 NOTE — Patient Instructions (Signed)
Nice to meet you Please try the tumeric and tart cherry juice  I will call with the results from today   Please send me a message in [place] with any questions or updates.  Please see me back in 4 weeks.   --Dr. Raeford Razor

## 2020-09-14 ENCOUNTER — Telehealth: Payer: Self-pay | Admitting: Family Medicine

## 2020-09-14 LAB — SEDIMENTATION RATE: Sed Rate: 4 mm/hr (ref 0–30)

## 2020-09-14 LAB — ANA,IFA RA DIAG PNL W/RFLX TIT/PATN
ANA Titer 1: NEGATIVE
Cyclic Citrullin Peptide Ab: 8 units (ref 0–19)
Rheumatoid fact SerPl-aCnc: <10 IU/mL (ref <14.0)

## 2020-09-14 LAB — C-REACTIVE PROTEIN: CRP: 1 mg/L (ref 0–10)

## 2020-09-14 LAB — URIC ACID: Uric Acid: 3.7 mg/dL — ABNORMAL LOW (ref 3.8–8.4)

## 2020-09-14 NOTE — Telephone Encounter (Signed)
Informed of results.   Rosemarie Ax, MD [facility] 09/14/2020, 11:41 AM

## 2020-10-04 ENCOUNTER — Ambulatory Visit (INDEPENDENT_AMBULATORY_CARE_PROVIDER_SITE_OTHER): Payer: Medicare Other | Admitting: Pulmonary Disease

## 2020-10-04 ENCOUNTER — Other Ambulatory Visit: Payer: Self-pay

## 2020-10-04 ENCOUNTER — Encounter: Payer: Self-pay | Admitting: Pulmonary Disease

## 2020-10-04 VITALS — BP 128/64 | HR 64 | Temp 97.2°F | Ht 68.0 in | Wt 172.0 lb

## 2020-10-04 DIAGNOSIS — D869 Sarcoidosis, unspecified: Secondary | ICD-10-CM | POA: Diagnosis not present

## 2020-10-04 DIAGNOSIS — G4733 Obstructive sleep apnea (adult) (pediatric): Secondary | ICD-10-CM

## 2020-10-04 DIAGNOSIS — Z9989 Dependence on other enabling machines and devices: Secondary | ICD-10-CM | POA: Diagnosis not present

## 2020-10-04 NOTE — Progress Notes (Signed)
[facility]  Chief Complaint  Patient presents with  . 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  Brief Summary:  Jeffery York is a 65 y.o. male former smoker with obstructive sleep apnea. He has hx of pulmonary sarcoidosis dx in 1990, s/p Rt upper lobectomy for aspergilloma in 2000.      Subjective:   He had CT chest in March.  Showed stable changes from sarcoidosis.  He had PFT at the [place] last year.  He isn't having cough, sputum, or hemoptysis.  Still has vague discomfort in Rt upper abdomen, but getting better.  He had MRI of abdomen recently and waiting to hear about results.  He was found to have elevated PSA and has follow up with urology.  He was also found to have broken tail bone.  Using CPAP nightly.  Feeling more rested.  No issues with mask fit.  Physical Exam:   Appearance - well kempt   ENMT - no sinus tenderness, no oral exudate, no LAN, Mallampati 3 airway, no stridor  Respiratory - equal breath sounds bilaterally, no wheezing or rales  CV - s1s2 regular rate and rhythm, no murmurs  Ext - no clubbing, no edema  Skin - no rashes  Psych - normal mood and affect  Appearance - well kempt   ENMT - no sinus tenderness, no oral exudate, no LAN, Mallampati 3 airway, no stridor  Respiratory - equal breath sounds bilaterally, no wheezing or rales  CV - s1s2 regular rate  and rhythm, no murmurs  Ext - no clubbing, no edema  Skin - no rashes  Psych - normal mood and affect   Pulmonary testing:   ACE 01/20/20 >> 22  Chest Imaging:   CT chest 03/08/17 >> extensive scarring, s/p RULectomy  CT angio chest 01/10/20 >> small PE in LUL and RML, GGO in RLL, s/p Rt upper lobectomy, apical scarring  HRCT chest 08/14/20 >> no change in appearance for sarcoidosis  Sleep Tests:   HST 01/11/18 >> AHI 19.9, SaO2 low 78%  Auto CPAP 09/01/20 to 09/30/20 >> used on 30 of 30 nights with average 7 hs 20 min.  Average AHI 2.9 with median CPAP 8 and 95 th percentile CPAP 11 cm H2O  Social History:  He  reports that he quit smoking about 32 years ago. He has a 10.00 pack-year smoking history. He has never used smokeless tobacco. He reports that he does not drink alcohol and does not use drugs.  Family History:  His family history includes CAD in his brother; Diabetes in an other family member; Lung cancer in his father; Pancreatic cancer in his brother; Prostate cancer in his brother.     Assessment/Plan:   History of sarcoidosis with bronchiectasis. - CT chest from March 2022 shows stable findings - he will bring copy of his  recent PFT from the [place] - monitor clinically  Unprovoked pulmonary embolism from August 2021. - eliquis per his PCP  Obstructive sleep apnea. - he is compliant with CPAP and reports benefit - uses [employer] for his DME - continue auto CPAP 5 to 15 cm H2O  S/p liver transplant for hx of hepatitis C with cirrhosis. - followed at [facility] - he will f/u with transplant doctors to assess MRI abdomen and whether there are any findings to explain his RUQ discomfort   Time Spent Involved in Patient Care on Day of Examination:  32 minutes  Follow up:  Patient Instructions  Follow up in 6 months   Medication List:   Allergies as of 10/04/2020      Reactions   Benazepril Hcl Swelling   angioedema   Erythromycin Ethylsuccinate     REACTION: GI Intolerance/vomiting   Keflex [cephalexin] Diarrhea   With cramps and Nausea      Medication List       Accurate as of Oct 04, 2020 11:22 AM. If you have any questions, ask your nurse or doctor.        allopurinol 300 MG tablet Commonly known as: ZYLOPRIM Take 300 mg by mouth daily.   amLODipine 10 MG tablet Commonly known as: NORVASC Take 1 tablet (10 mg total) by mouth daily.   apixaban 2.5 MG Tabs tablet Commonly known as: ELIQUIS Take 1 tablet (2.5 mg total) by mouth 2 (two) times daily.   ascorbic acid 500 MG tablet Commonly known as: VITAMIN C Take 1,000 mg by mouth daily.   atorvastatin 10 MG tablet Commonly known as: LIPITOR Take 1 tablet (10 mg total) by mouth every other day.   cetirizine 10 MG tablet Commonly known as: ZYRTEC Take 10 mg by mouth daily.   cholecalciferol 1000 units tablet Commonly known as: VITAMIN D Take 1,000 Units by mouth daily.   Colchicine 0.6 MG Caps Take 0.6 mg by mouth 2 (two) times daily as needed (gout flare). What changed:   when to take this  additional instructions   CoQ10 100 MG Caps Take 100 mg by mouth daily.   cyanocobalamin 1000 MCG tablet Take 1,000 mcg by mouth daily.   cyclobenzaprine 10 MG tablet Commonly known as: FLEXERIL Take 1 tablet (10 mg total) by mouth 2 (two) times daily as needed for muscle spasms.   fluticasone 50 MCG/ACT nasal spray Commonly known as: FLONASE Place 2 sprays into both nostrils daily as needed for allergies.   hydrochlorothiazide 25 MG tablet Commonly known as: HYDRODIURIL Take 25 mg by mouth daily.   insulin glargine 100 UNIT/ML injection Commonly known as: LANTUS Inject 17 Units into the skin daily at 2 PM. Currently 21 units (17-20 uts can change)   Jardiance 25 MG Tabs tablet Generic drug: empagliflozin Take 12.5 mg by mouth daily.   magnesium oxide 400 MG tablet Commonly known as: MAG-OX Take 1 tablet by mouth 2 (two) times daily.   tacrolimus 1  MG capsule Commonly known as: PROGRAF Take 2-3 mg by mouth See admin instructions. Take 3 capsules every morning and take 2 capsules every evening       Signature:  Chesley Mires, MD [place] Pager - [phone] 10/04/2020, 11:22 AM

## 2020-10-04 NOTE — Patient Instructions (Signed)
Follow up in 6 months 

## 2020-10-08 ENCOUNTER — Encounter: Payer: Self-pay | Admitting: Family Medicine

## 2020-10-08 ENCOUNTER — Other Ambulatory Visit: Payer: Self-pay

## 2020-10-08 ENCOUNTER — Ambulatory Visit (INDEPENDENT_AMBULATORY_CARE_PROVIDER_SITE_OTHER): Payer: Medicare Other | Admitting: Family Medicine

## 2020-10-08 VITALS — BP 120/76 | Ht 68.0 in | Wt 172.0 lb

## 2020-10-08 DIAGNOSIS — S322XXK Fracture of coccyx, subsequent encounter for fracture with nonunion: Secondary | ICD-10-CM | POA: Diagnosis not present

## 2020-10-08 DIAGNOSIS — M545 Low back pain, unspecified: Secondary | ICD-10-CM

## 2020-10-08 DIAGNOSIS — S322XXA Fracture of coccyx, initial encounter for closed fracture: Secondary | ICD-10-CM | POA: Insufficient documentation

## 2020-10-08 NOTE — Assessment & Plan Note (Signed)
Has been doing well with the home therapy and only endorses mild soreness today. -Counseled on home exercise therapy and supportive care. -Could consider physical therapy.

## 2020-10-08 NOTE — Assessment & Plan Note (Signed)
Imaging from 4/8 was showing a remote history of fracture of the coccyx.  Likely exacerbated as to the source of his pain.  Feeling much better today. -Counseled on home exercise therapy and supportive care. -Could consider physical therapy.

## 2020-10-08 NOTE — Progress Notes (Signed)
Jeffery York - 65 y.o. male MRN [id number]  Date of birth: [birth year]  SUBJECTIVE:  Including CC & ROS.  No chief complaint on file.   Jeffery York is a 65 y.o. male that is following up for his low back pain and coccyx fracture.     Review of Systems See HPI   HISTORY: Past Medical, Surgical, Social, and Family History Reviewed & Updated per EMR.   Pertinent Historical Findings include:  Past Medical History:  Diagnosis Date  . Benign prostatic hypertrophy   . Cirrhosis ([place])   . Diabetes mellitus    initially 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 but then the diabetes resurface without the use of steroids  . [place] (hepatocellular carcinoma) ([place])    s/p TACE and microwave ablation  . Hep C w/o coma, chronic (HCC)    bx aprox 12-10, s/p Harvoni, on a transplant list candidate   . History of sarcoidosis    used steroids x 10 years  . Hyperlipidemia   . Hypertension   . Liver transplant recipient [facility]) 2014-09-14  . OSA (obstructive sleep apnea) 01/15/2018  . 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 Uveitis    h/o    Past Surgical History:  Procedure Laterality Date  . LIVER TRANSPLANT  04-2015  . lung resection aspergilloma  09/14/1998    Family History  Problem Relation Age of Onset  . Prostate cancer Brother        at age 26s  . Pancreatic cancer Brother   . CAD Brother   . Lung cancer Father   . Diabetes Other        father side   . Colon cancer Neg Hx     Social History   Socioeconomic History  . Marital status: Married    Spouse name: Not on file  . Number of children: 0  . Years of education: Not on file  . Highest education level: Not on file  Occupational History  . Occupation: retired Nature conservation officer  Tobacco Use  . Smoking status: Former Smoker    Packs/day: 1.00    Years: 10.00    Pack years: 10.00    Quit date: 1990    Years since quitting: 32.3  . Smokeless tobacco: Never Used  . Tobacco comment: remote smoker  Vaping Use  . Vaping Use: Never used  Substance and  Sexual Activity  . Alcohol use: No  . Drug use: No  . Sexual activity: Yes  Other Topics Concern  . Not on file  Social History Narrative   Mother- Lance Bosch one of my patients, deceased ~ Sep 14, 2011   Married, No children    Social Determinants of Health   Financial Resource Strain: Not on file  Food Insecurity: Not on file  Transportation Needs: Not on file  Physical Activity: Not on file  Stress: Not on file  Social Connections: Not on file  Intimate Partner Violence: Not on file     PHYSICAL EXAM:  VS: BP 120/76 (BP Location: Left Arm, Patient Position: Sitting, Cuff Size: Large)   Ht 5\' 8"  (1.727 m)   Wt 172 lb (78 kg)   BMI 26.15 kg/m  Physical Exam Gen: NAD, alert, cooperative with exam, well-appearing   ASSESSMENT & PLAN:   Closed fracture of coccyx ([place]) Imaging from 4/8 was showing a remote history of fracture of the coccyx.  Likely exacerbated as to the source of his pain.  Feeling much better today. -Counseled on home exercise therapy and  supportive care. -Could consider physical therapy.  Acute bilateral low back pain without sciatica Has been doing well with the home therapy and only endorses mild soreness today. -Counseled on home exercise therapy and supportive care. -Could consider physical therapy.

## 2020-10-14 DIAGNOSIS — N401 Enlarged prostate with lower urinary tract symptoms: Secondary | ICD-10-CM | POA: Diagnosis not present

## 2020-10-14 DIAGNOSIS — R3912 Poor urinary stream: Secondary | ICD-10-CM | POA: Diagnosis not present

## 2020-10-14 DIAGNOSIS — R351 Nocturia: Secondary | ICD-10-CM | POA: Diagnosis not present

## 2020-10-14 DIAGNOSIS — R350 Frequency of micturition: Secondary | ICD-10-CM | POA: Diagnosis not present

## 2020-10-14 DIAGNOSIS — N5201 Erectile dysfunction due to arterial insufficiency: Secondary | ICD-10-CM | POA: Diagnosis not present

## 2020-10-14 DIAGNOSIS — R972 Elevated prostate specific antigen [PSA]: Secondary | ICD-10-CM | POA: Diagnosis not present

## 2020-12-20 DIAGNOSIS — Z20822 Contact with and (suspected) exposure to covid-19: Secondary | ICD-10-CM | POA: Diagnosis not present

## 2021-01-14 LAB — HEMOGLOBIN A1C: Hemoglobin A1C: 6.6

## 2021-03-02 ENCOUNTER — Ambulatory Visit: Payer: Medicare Other | Admitting: Internal Medicine

## 2021-03-03 ENCOUNTER — Ambulatory Visit: Payer: Medicare Other | Attending: Internal Medicine

## 2021-03-03 ENCOUNTER — Ambulatory Visit (INDEPENDENT_AMBULATORY_CARE_PROVIDER_SITE_OTHER): Payer: Medicare Other | Admitting: Internal Medicine

## 2021-03-03 ENCOUNTER — Encounter: Payer: Self-pay | Admitting: Internal Medicine

## 2021-03-03 ENCOUNTER — Other Ambulatory Visit: Payer: Self-pay

## 2021-03-03 VITALS — BP 134/68 | HR 61 | Temp 97.9°F | Resp 16 | Ht 68.0 in | Wt 170.4 lb

## 2021-03-03 DIAGNOSIS — Z944 Liver transplant status: Secondary | ICD-10-CM

## 2021-03-03 DIAGNOSIS — R972 Elevated prostate specific antigen [PSA]: Secondary | ICD-10-CM

## 2021-03-03 DIAGNOSIS — Z23 Encounter for immunization: Secondary | ICD-10-CM

## 2021-03-03 DIAGNOSIS — Z794 Long term (current) use of insulin: Secondary | ICD-10-CM

## 2021-03-03 DIAGNOSIS — G4733 Obstructive sleep apnea (adult) (pediatric): Secondary | ICD-10-CM

## 2021-03-03 DIAGNOSIS — N400 Enlarged prostate without lower urinary tract symptoms: Secondary | ICD-10-CM | POA: Diagnosis not present

## 2021-03-03 DIAGNOSIS — E119 Type 2 diabetes mellitus without complications: Secondary | ICD-10-CM | POA: Diagnosis not present

## 2021-03-03 DIAGNOSIS — I10 Essential (primary) hypertension: Secondary | ICD-10-CM | POA: Diagnosis not present

## 2021-03-03 NOTE — Patient Instructions (Signed)
Please continue seeing your other doctors regularly.  Proceed with your COVID booster at your convenience  Next visit in 1 year, please call sooner if needed

## 2021-03-03 NOTE — Progress Notes (Signed)
Subjective:    Patient ID: Jeffery York, male    DOB: 11-13-1955, 65 y.o.   MRN: [id number]  DOS:  03/03/2021 Type of visit - description: Routine checkup Since the last visit, he saw multiple doctors. Available notes to me   were reviewed. Other than occasionally right-sided chest/abdomen pain he is doing okay.  Review of Systems See above   Past Medical History:  Diagnosis Date   Benign prostatic hypertrophy    Cirrhosis ([employer])    Diabetes mellitus    initially 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 but then the diabetes resurface without the use of steroids   HCC (hepatocellular carcinoma) ([place])    s/p TACE and microwave ablation   Hep C w/o coma, chronic ([place])    bx aprox 12-10, s/p Harvoni, on a transplant list candidate    History of sarcoidosis    used steroids x 10 years   Hyperlipidemia    Hypertension    Liver transplant recipient ([place]) 2016   OSA (obstructive sleep apnea) 01/15/2018   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   Uveitis    h/o    Past Surgical History:  Procedure Laterality Date   LIVER TRANSPLANT  04-2015   lung resection aspergilloma  2000    Allergies as of 03/03/2021       Reactions   Benazepril Hcl Swelling   angioedema   Erythromycin Ethylsuccinate    REACTION: GI Intolerance/vomiting   Keflex [cephalexin] Diarrhea   With cramps and Nausea        Medication List        Accurate as of March 03, 2021  9:16 AM. If you have any questions, ask your nurse or doctor.          allopurinol 300 MG tablet Commonly known as: ZYLOPRIM Take 300 mg by mouth daily.   amLODipine 10 MG tablet Commonly known as: NORVASC Take 1 tablet (10 mg total) by mouth daily.   apixaban 2.5 MG Tabs tablet Commonly known as: ELIQUIS Take 1 tablet (2.5 mg total) by mouth 2 (two) times daily.   ascorbic acid 500 MG tablet Commonly known as: VITAMIN C Take 1,000 mg by mouth daily.   atorvastatin 10 MG tablet Commonly known as: LIPITOR Take 1 tablet (10 mg  total) by mouth every other day.   cetirizine 10 MG tablet Commonly known as: ZYRTEC Take 10 mg by mouth daily.   cholecalciferol 1000 units tablet Commonly known as: VITAMIN D Take 1,000 Units by mouth daily.   Colchicine 0.6 MG Caps Take 0.6 mg by mouth 2 (two) times daily as needed (gout flare). What changed:  when to take this additional instructions   CoQ10 100 MG Caps Take 100 mg by mouth daily.   cyanocobalamin 1000 MCG tablet Take 1,000 mcg by mouth daily.   cyclobenzaprine 10 MG tablet Commonly known as: FLEXERIL Take 1 tablet (10 mg total) by mouth 2 (two) times daily as needed for muscle spasms.   fluticasone 50 MCG/ACT nasal spray Commonly known as: FLONASE Place 2 sprays into both nostrils daily as needed for allergies.   hydrochlorothiazide 25 MG tablet Commonly known as: HYDRODIURIL Take 25 mg by mouth daily.   insulin glargine 100 UNIT/ML injection Commonly known as: LANTUS Inject 17 Units into the skin daily at 2 PM. Currently 21 units (17-20 uts can change)   Jardiance 25 MG Tabs tablet Generic drug: empagliflozin Take 12.5 mg by mouth daily.   magnesium oxide 400 MG tablet Commonly  known as: MAG-OX Take 1 tablet by mouth 2 (two) times daily.   tacrolimus 1 MG capsule Commonly known as: PROGRAF Take 2-3 mg by mouth See admin instructions. Take 3 capsules every morning and take 2 capsules every evening           Objective:   Physical Exam BP 134/68 (BP Location: Left Arm, Patient Position: Sitting, Cuff Size: Small)   Pulse 61   Temp 97.9 F (36.6 C) (Oral)   Resp 16   Ht 5\' 8"  (1.727 m)   Wt 170 lb 6 oz (77.3 kg)   SpO2 94%   BMI 25.91 kg/m  General:   Well developed, NAD, BMI noted. HEENT:  Normocephalic . Face symmetric, atraumatic Lungs:  CTA B Normal respiratory effort, no intercostal retractions, no accessory muscle use. Heart: RRR,  no murmur.  DM foot exam: No edema, good pedal pulses, pinprick examination  normal Skin: Not pale. Not jaundice Neurologic:  alert & oriented X3.  Speech normal, gait appropriate for age and unassisted Psych--  Cognition and judgment appear intact.  Cooperative with normal attention span and concentration.  Behavior appropriate. No anxious or depressed appearing.      Assessment     ASSESSMENT DM : Initially 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 then it resurfaced w/o steroids HTN Hyperlipidemia Gout BPH-- failed Flomax before OSA: On CPAP ED ?Osteoporosis steroid-induced: dexa wnl 2010 H/o uveitis Sarcoidosis w/ bronchiectasis (s/p steroids for 10 years) GI:  f/u at [facility]  -- Liver transplant  04-07-2015 @ [place]   --Hep C: S/p harvoni failure   --Hepatocellular ca, s/p TACE- microwave ablation --> failed, had chemo embolization 03-2015  --Reportedly get hepatitis A-B shots from GI  [facility]: Long-term low-dose Eliquis   PLAN: DM: Reports he is seen regularly at the [place].  Foot exam normal today. HTN: BP is very good today, last BMP okay, continue amlodipine, HCTZ. LUTS/BPH: At the last visit, he was a slightly tender at the right side of the prostate, PSA was elevated, was seen at the [facility] urology, they recommended biopsy, however prior to the procedure a PSA returned wnl per pt, bx was canceled and he has a follow-up with them in the next few months. Sarcoidosis with bronchiectasis, OSA, Last visit with pulmonary 10-2020, felt to be stable. Preventive care: Flu shot today, encouraged a COVID booster. Patient is seen regularly at the [place], [place], pulmonary.  Had blood work done at his liver transplant doctors few days ago.   Rec f/u 1 year, sooner if needed.     This visit occurred during the SARS-CoV-2 public health emergency.  Safety protocols were in place, including screening questions prior to the visit, additional usage of staff PPE, and extensive cleaning of exam room while observing appropriate contact time as indicated for  disinfecting solutions.

## 2021-03-03 NOTE — Assessment & Plan Note (Signed)
DM: Reports he is seen regularly at the [place].  Foot exam normal today. HTN: BP is very good today, last BMP okay, continue amlodipine, HCTZ. LUTS/BPH: At the last visit, he was a slightly tender at the right side of the prostate, PSA was elevated, was seen at the [facility] urology, they recommended biopsy, however prior to the procedure a PSA returned wnl per pt, bx was canceled and he has a follow-up with them in the next few months. Sarcoidosis with bronchiectasis, OSA, Last visit with pulmonary 10-2020, felt to be stable. Preventive care: Flu shot today, encouraged a COVID booster. Patient is seen regularly at the [place], [place], pulmonary.  Had blood work done at his liver transplant doctors few days ago.   Rec f/u 1 year, sooner if needed.

## 2021-03-04 NOTE — Progress Notes (Signed)
   Covid-19 Vaccination Clinic  Name:  Jeffery York    MRN: [id number] DOB: 01/28/1956  03/04/2021  Mr. Jeffery York was observed post Covid-19 immunization for 15 minutes without incident. He was provided with Vaccine Information Sheet and instruction to access the V-Safe system.   Mr. Jeffery York was instructed to call 911 with any severe reactions post vaccine: Difficulty breathing  Swelling of face and throat  A fast heartbeat  A bad rash all over body  Dizziness and weakness

## 2021-03-07 ENCOUNTER — Telehealth: Payer: Self-pay | Admitting: Internal Medicine

## 2021-03-07 NOTE — Telephone Encounter (Signed)
Low-dose Tylenol is okay, 500 mg TID for few days is okay. If he feels needs more than that, recommend to consult his liver specialist.

## 2021-03-07 NOTE — Telephone Encounter (Signed)
Spoke w/ Pt- informed of recommendations. Pt verbalized understanding.  

## 2021-03-07 NOTE — Telephone Encounter (Signed)
Patient has a dentist procedure on 03/30/2021 and was advised from his dentist to see if pain meds/reliever can be prescribed. Please advise.

## 2021-03-07 NOTE — Telephone Encounter (Signed)
Please advise 

## 2021-03-14 ENCOUNTER — Other Ambulatory Visit (HOSPITAL_BASED_OUTPATIENT_CLINIC_OR_DEPARTMENT_OTHER): Payer: Self-pay

## 2021-03-14 DIAGNOSIS — Z23 Encounter for immunization: Secondary | ICD-10-CM | POA: Diagnosis not present

## 2021-03-14 MED ORDER — MODERNA COVID-19 BIVAL BOOSTER 50 MCG/0.5ML IM SUSP
INTRAMUSCULAR | 0 refills | Status: DC
  Filled 2021-03-14: qty 0.5, 1d supply, fill #0

## 2021-03-22 ENCOUNTER — Other Ambulatory Visit: Payer: Self-pay

## 2021-03-22 ENCOUNTER — Encounter: Payer: Self-pay | Admitting: Pulmonary Disease

## 2021-03-22 ENCOUNTER — Ambulatory Visit (INDEPENDENT_AMBULATORY_CARE_PROVIDER_SITE_OTHER): Payer: Medicare Other | Admitting: Pulmonary Disease

## 2021-03-22 VITALS — BP 130/70 | HR 74 | Temp 98.2°F | Ht 68.0 in | Wt 172.6 lb

## 2021-03-22 DIAGNOSIS — G4733 Obstructive sleep apnea (adult) (pediatric): Secondary | ICD-10-CM | POA: Diagnosis not present

## 2021-03-22 DIAGNOSIS — Z9989 Dependence on other enabling machines and devices: Secondary | ICD-10-CM | POA: Diagnosis not present

## 2021-03-22 DIAGNOSIS — D869 Sarcoidosis, unspecified: Secondary | ICD-10-CM | POA: Diagnosis not present

## 2021-03-22 NOTE — Progress Notes (Signed)
[facility]  Chief Complaint  Patient presents with   Follow-up    Improvement in the past few weeks.  Still has funny feeling on right side, sometimes on the left.    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  Brief Summary:  Jeffery York is a 65 y.o. male former smoker with obstructive sleep apnea.  He has hx of pulmonary sarcoidosis dx in 1990, s/p Rt upper lobectomy for aspergilloma in 2000.      Subjective:   He uses his CPAP nightly.  No issues with mask fit.  Not having cough, wheeze, chest congestion, or sputum production.  He will be going to the [place] later this month and will try to get copy of his PFT then.  His right sided pain improved, but then flared up again when he was lifting 50 lbs bags of dirt.     Physical Exam:   Appearance - well kempt   ENMT - no sinus tenderness, no oral exudate, no LAN, Mallampati 3 airway, no stridor  Respiratory - equal breath sounds bilaterally, no wheezing or rales  CV - s1s2 regular rate and rhythm, no murmurs  Ext - no clubbing, no edema  Skin - no rashes  Psych - normal mood and affect    Pulmonary testing:  ACE 01/20/20 >> 22  Chest Imaging:  CT chest 03/08/17 >> extensive scarring, s/p RULectomy CT angio chest 01/10/20 >> small PE in LUL and RML, GGO in RLL, s/p Rt upper lobectomy, apical scarring HRCT chest 08/14/20 >> no change in appearance for sarcoidosis  Sleep Tests:  HST 01/11/18  >> AHI 19.9, SaO2 low 78% Auto CPAP 02/19/21 to 03/20/21 >> used on 28 of 30 nights with average 7 hrs 8 min.  Average AHI 2.4 with median CPAP 8 and 95 th percentile CPAP 11 cm H2O  Social History:  He  reports that he quit smoking about 32 years ago. His smoking use included cigarettes. He has a 10.00 pack-year smoking history. He has never used smokeless tobacco. He reports that he does not drink alcohol and does not use drugs.  Family History:  His family history includes CAD in his brother; Diabetes in an other family member; Lung cancer in his father; Pancreatic cancer in his brother; Prostate cancer in his brother.     Assessment/Plan:   History of sarcoidosis with bronchiectasis. - he has stable scarring in his lungs and no significant respiratory symptoms - he will have his PFT forwarded from the [place] - monitor clinically  Unprovoked pulmonary embolism from August 2021. - gets eliquis from his PCP  Obstructive sleep apnea. - he is compliant with CPAP and reports benefit - uses [employer] for his DME - continue auto CPAP 5 to 15 cm H2O  S/p liver transplant for hx of hepatitis C with cirrhosis. - followed at [place]  Right sided pain. - likely musculoskeletal - improving  Time  Spent Involved in Patient Care on Day of Examination:  34 minutes  Follow up:   Patient Instructions  Follow up in 6 months  Medication List:   Allergies as of 03/22/2021       Reactions   Benazepril Hcl Swelling   angioedema   Erythromycin Ethylsuccinate    REACTION: GI Intolerance/vomiting   Keflex [cephalexin] Diarrhea   With cramps and Nausea        Medication List        Accurate as of March 22, 2021 12:16 PM. If you have any questions, ask your nurse or doctor.          STOP taking these medications    cyanocobalamin 1000 MCG tablet Stopped by: Chesley Mires, MD   cyclobenzaprine 10 MG tablet Commonly known as: FLEXERIL Stopped by: Chesley Mires, MD        TAKE these medications    allopurinol 300 MG tablet Commonly known as: ZYLOPRIM Take 300 mg by mouth daily.   amLODipine 10 MG tablet Commonly known as: NORVASC Take 1 tablet (10 mg total) by mouth daily.   apixaban 2.5 MG Tabs tablet Commonly known as: ELIQUIS Take 1 tablet (2.5 mg total) by mouth 2 (two) times daily.   ascorbic acid 500 MG tablet Commonly known as: VITAMIN C Take 1,000 mg by mouth daily.   atorvastatin 10 MG tablet Commonly known as: LIPITOR Take 1 tablet (10 mg total) by mouth every other day.   cetirizine 10 MG tablet Commonly known as: ZYRTEC Take 10 mg by mouth daily.   cholecalciferol 1000 units tablet Commonly known as: VITAMIN D Take 1,000 Units by mouth daily.   Colchicine 0.6 MG Caps Take 0.6 mg by mouth 2 (two) times daily as needed (gout flare). What changed:  when to take this additional instructions   CoQ10 100 MG Caps Take 100 mg by mouth daily.   fluticasone 50 MCG/ACT nasal spray Commonly known as: FLONASE Place 2 sprays into both nostrils daily as needed for allergies.   hydrochlorothiazide 25 MG tablet Commonly known as: HYDRODIURIL Take 25 mg by mouth daily.   insulin glargine 100 UNIT/ML injection Commonly known as: LANTUS Inject 17 Units into the skin daily at 2 PM. Currently 21 units (17-20 uts can change)   Jardiance 25 MG Tabs tablet Generic drug: empagliflozin Take 12.5 mg by mouth daily.   magnesium oxide 400 MG tablet Commonly known as: MAG-OX Take 1 tablet by mouth 2 (two) times daily.   Moderna COVID-19 Bival Booster 50 MCG/0.5ML injection Generic drug: COVID-19 mRNA bivalent vaccine (Moderna) Inject into the muscle.   tacrolimus 1 MG capsule Commonly known as: PROGRAF Take 2-3 mg by mouth See admin instructions. Take 3 capsules every morning and take 2 capsules every evening        Signature:  Chesley Mires, MD [place] Pager - [phone] 03/22/2021, 12:16 PM

## 2021-03-22 NOTE — Patient Instructions (Signed)
Follow up in 6 months 

## 2021-04-04 ENCOUNTER — Ambulatory Visit (INDEPENDENT_AMBULATORY_CARE_PROVIDER_SITE_OTHER): Payer: Medicare Other

## 2021-04-04 VITALS — Ht 68.0 in | Wt 171.0 lb

## 2021-04-04 DIAGNOSIS — Z0000 Encounter for general adult medical examination without abnormal findings: Secondary | ICD-10-CM

## 2021-04-04 NOTE — Progress Notes (Addendum)
Subjective:   Jeffery York is a 65 y.o. male who presents for Medicare Annual/Subsequent preventive examination.  I connected with Jeffery York today by telephone and verified that I am speaking with the correct person using two identifiers. Location patient: home Location provider: work Persons participating in the virtual visit: patient, Marine scientist.    I discussed the limitations, risks, security and privacy concerns of performing an evaluation and management service by telephone and the availability of in person appointments. I also discussed with the patient that there may be a patient responsible charge related to this service. The patient expressed understanding and verbally consented to this telephonic visit.    Interactive audio and video telecommunications were attempted between this provider and patient, however failed, due to patient having technical difficulties OR patient did not have access to video capability.  We continued and completed visit with audio only.  Some vital signs may be absent or patient reported.   Time Spent with patient on telephone encounter: 20 minutes   Review of Systems     Cardiac Risk Factors include: advanced age (>33men, >60 women);male gender;diabetes mellitus;dyslipidemia;hypertension     Objective:    Today's Vitals   04/04/21 1103  Weight: 171 lb (77.6 kg)  Height: 5\' 8"  (1.727 m)   Body mass index is 26 kg/m.  Advanced Directives 04/04/2021 05/09/2019 11/01/2018 11/29/2016 05/17/2016  Does Patient Have a Medical Advance Directive? Yes Yes No Yes No  Type of Paramedic of [place];Living will [place];Living will - Living will -  Does patient want to make changes to medical advance directive? - No - Patient declined - No - Patient declined -  Copy of [place] in Chart? No - copy requested No - copy requested - - -  Would patient like information on creating a medical advance  directive? - - - - Yes (MAU/Ambulatory/Procedural Areas - Information given)    Current Medications (verified) Outpatient Encounter Medications as of 04/04/2021  Medication Sig   allopurinol (ZYLOPRIM) 300 MG tablet Take 300 mg by mouth daily.    amLODipine (NORVASC) 10 MG tablet Take 1 tablet (10 mg total) by mouth daily.   apixaban (ELIQUIS) 2.5 MG TABS tablet Take 1 tablet (2.5 mg total) by mouth 2 (two) times daily.   ascorbic acid (VITAMIN C) 500 MG tablet Take 1,000 mg by mouth daily.    atorvastatin (LIPITOR) 10 MG tablet Take 1 tablet (10 mg total) by mouth every other day.   cetirizine (ZYRTEC) 10 MG tablet Take 10 mg by mouth daily.   cholecalciferol (VITAMIN D) 1000 units tablet Take 1,000 Units by mouth daily.    Coenzyme Q10 (COQ10) 100 MG CAPS Take 100 mg by mouth daily.   Colchicine 0.6 MG CAPS Take 0.6 mg by mouth 2 (two) times daily as needed (gout flare). (Patient taking differently: Take 0.6 mg by mouth once a week. 0.6 1 pill every saturday)   fluticasone (FLONASE) 50 MCG/ACT nasal spray Place 2 sprays into both nostrils daily as needed for allergies.    hydrochlorothiazide (HYDRODIURIL) 25 MG tablet Take 25 mg by mouth daily.   insulin glargine (LANTUS) 100 UNIT/ML injection Inject 17 Units into the skin daily at 2 PM. Currently 21 units (17-20 uts can change)   JARDIANCE 25 MG TABS tablet Take 12.5 mg by mouth daily.   magnesium oxide (MAG-OX) 400 MG tablet Take 1 tablet by mouth 2 (two) times daily.  tacrolimus (PROGRAF) 1 MG capsule Take 2-3 mg by mouth See admin instructions. Take 3 capsules every morning and take 2 capsules every evening   COVID-19 mRNA bivalent vaccine, Moderna, (MODERNA COVID-19 BIVAL BOOSTER) 50 MCG/0.5ML injection Inject into the muscle. (Patient not taking: Reported on 04/04/2021)   No facility-administered encounter medications on file as of 04/04/2021.    Allergies (verified) Benazepril hcl, Erythromycin ethylsuccinate, and Keflex  [cephalexin]   History: Past Medical History:  Diagnosis Date   Benign prostatic hypertrophy    Cirrhosis ([place])    Diabetes mellitus    initially 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 but then the diabetes resurface without the use of steroids   HCC (hepatocellular carcinoma) (HCC)    s/p TACE and microwave ablation   Hep C w/o coma, chronic (HCC)    bx aprox 12-10, s/p Harvoni, on a transplant list candidate    History of sarcoidosis    used steroids x 10 years   Hyperlipidemia    Hypertension    Liver transplant recipient ([place]) 22-Aug-2014   OSA (obstructive sleep apnea) 01/15/2018   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   Uveitis    h/o   Past Surgical History:  Procedure Laterality Date   LIVER TRANSPLANT  04-2015   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, 1998   Family History  Problem Relation Age of Onset   Prostate cancer Brother        at age 62s   Pancreatic cancer Brother    CAD Brother    Lung cancer Father    Diabetes Other        father side    Colon cancer Neg Hx    Social History   Socioeconomic History   Marital status: Married    Spouse name: Not on file   Number of children: 0   Years of education: Not on file   Highest education level: Not on file  Occupational History   Occupation: retired Nature conservation officer  Tobacco Use   Smoking status: Former    Packs/day: 1.00    Years: 10.00    Pack years: 10.00    Types: Cigarettes    Quit date: 1990    Years since quitting: 32.8   Smokeless tobacco: Never   Tobacco comments:    remote smoker  Scientific laboratory technician Use: Never used  Substance and Sexual Activity   Alcohol use: No   Drug use: No   Sexual activity: Yes  Other Topics Concern   Not on file  Social History Narrative   Mother- Lance Bosch one of my patients, deceased ~ 08-22-2011   Married, No children    Social Determinants of Health   Financial Resource Strain: Low Risk    Difficulty of Paying Living Expenses: Not hard at all  Food Insecurity: No Food Insecurity   Worried  About Charity fundraiser in the Last Year: Never true   Arboriculturist in the Last Year: Never true  Transportation Needs: No Transportation Needs   Lack of Transportation (Medical): No   Lack of Transportation (Non-Medical): No  Physical Activity: Sufficiently Active   Days of Exercise per Week: 5 days   Minutes of Exercise per Session: 40 min  Stress: No Stress Concern Present   Feeling of Stress : Not at all  Social Connections: Socially Isolated   Frequency of Communication with Friends and Family: Once a week   Frequency of Social Gatherings with Friends and Family: Once a week  Attends Religious Services: Never   Active Member of Clubs or Organizations: No   Attends Archivist Meetings: Never   Marital Status: Married    Tobacco Counseling Counseling given: Not Answered Tobacco comments: remote smoker   Clinical Intake:  Pre-visit preparation completed: Yes  Pain : No/denies pain     BMI - recorded: 26 Nutritional Status: BMI 25 -29 Overweight Nutritional Risks: None Diabetes: Yes CBG done?: No Did pt. bring in CBG monitor from home?: No  How often do you need to have someone help you when you read instructions, pamphlets, or other written materials from your doctor or pharmacy?: 1 - Never  Diabetes:  Is the patient diabetic?  Yes  If diabetic, was a CBG obtained today?  No  Did the patient bring in their glucometer from home?  No phone visit How often do you monitor your CBG's? Daily.   Financial Strains and Diabetes Management:  Are you having any financial strains with the device, your supplies or your medication? No .  Does the patient want to be seen by Chronic Care Management for management of their diabetes?  No  Would the patient like to be referred to a Nutritionist or for Diabetic Management?  No   Diabetic Exams:  Diabetic Eye Exam: . Overdue for diabetic eye exam. Pt has been advised about the importance in completing this exam.  Patient has an upcoming appt  Diabetic Foot Exam: Completed 03/03/2021.    Interpreter Needed?: No  Information entered by :: Caroleen Hamman LPN   Activities of Daily Living In your present state of health, do you have any difficulty performing the following activities: 04/04/2021 07/07/2020  Hearing? N N  Vision? N N  Difficulty concentrating or making decisions? N N  Walking or climbing stairs? N N  Dressing or bathing? N N  Doing errands, shopping? N N  Preparing Food and eating ? N -  Using the Toilet? N -  In the past six months, have you accidently leaked urine? N -  Do you have problems with loss of bowel control? N -  Managing your Medications? N -  Managing your Finances? N -  Housekeeping or managing your Housekeeping? N -  Some recent data might be hidden    Patient Care Team: Colon Branch, MD as PCP - General Drazek, Dawn, CRNP as Nurse Practitioner (Nurse Practitioner) Claudia Pollock, Vianne Bulls, MD as Referring Physician (Internal Medicine) Zamor, Ander Gaster, MD as Attending Physician (Internal Medicine) Genia Del, MD (Internal Medicine) Renato Shin, MD as Consulting Physician (Endocrinology)  Indicate any recent Medical Services you may have received from other than [facility] providers in the past year (date may be approximate).     Assessment:   This is a routine wellness examination for [place].  Hearing/Vision screen Hearing Screening - Comments:: No issues Vision Screening - Comments:: Wears glasses [facility]  Dietary issues and exercise activities discussed: Current Exercise Habits: Home exercise routine, Type of exercise: yoga;strength training/weights, Time (Minutes): 40, Frequency (Times/Week): 5, Weekly Exercise (Minutes/Week): 200, Intensity: Mild, Exercise limited by: None identified   Goals Addressed             This Visit's Progress    Patient Stated       Maintain current activity level & eat healthier        Depression Screen PHQ 2/9 Scores 04/04/2021 03/03/2021 07/07/2020 05/09/2019 08/02/2017 05/17/2016 12/13/2015  PHQ - 2 Score 0 0 0 0 0 0  0    Fall Risk Fall Risk  04/04/2021 03/03/2021 08/30/2020 07/07/2020 05/09/2019  Falls in the past year? 0 1 0 0 0  Number falls in past yr: 0 0 0 0 -  Injury with Fall? 0 1 0 0 -  Follow up Falls prevention discussed Falls evaluation completed - - -    FALL RISK PREVENTION PERTAINING TO THE HOME:  Any stairs in or around the home? No  Home free of loose throw rugs in walkways, pet beds, electrical cords, etc? Yes  Adequate lighting in your home to reduce risk of falls? Yes   ASSISTIVE DEVICES UTILIZED TO PREVENT FALLS:  Life alert? No  Use of a cane, walker or w/c? No  Grab bars in the bathroom? No  Shower chair or bench in shower? No  Elevated toilet seat or a handicapped toilet? No   TIMED UP AND GO:  Was the test performed? No . Phone visit   Cognitive Function:Normal cognitive status assessed by this Nurse Health Advisor. No abnormalities found.   MMSE - Mini Mental State Exam 05/17/2016  Orientation to time 5  Orientation to Place 5  Registration 3  Attention/ Calculation 5  Recall 3  Language- name 2 objects 2  Language- repeat 1  Language- follow 3 step command 3  Language- read & follow direction 1  Write a sentence 1  Copy design 1  Total score 30        Immunizations Immunization History  Administered Date(s) Administered   Fluad Quad(high Dose 65+) 03/03/2021   H1N1 06/23/2008   Hep A / Hep B 05/26/2013   Influenza Split 05/05/2011   Influenza Whole 04/16/2007, 03/09/2009, 04/05/2010   Influenza, Quadrivalent, Recombinant, Inj, Pf 04/14/2019   Influenza, Seasonal, Injecte, Preservative Fre 04/30/2013   Influenza,inj,Quad PF,6+ Mos 04/06/2016, 04/03/2017, 03/25/2018, 02/16/2020   Influenza-Unspecified 02/04/2015, 02/04/2016   Moderna Covid-19 Vaccine Bivalent Booster 69yrs & up 03/03/2021   Moderna Sars-Covid-2  Vaccination 06/25/2019, 07/22/2019, 01/21/2020, 09/01/2020   Pneumococcal Conjugate-13 07/23/2014   Pneumococcal Polysaccharide-23 03/05/2006, 05/21/2013   Td 06/05/2002   Tdap 05/21/2013   Zoster Recombinat (Shingrix) 01/14/2021   Zoster, Live 11/04/2014    TDAP status: Up to date  Flu Vaccine status: Up to date  Pneumococcal vaccine status: Due, Education has been provided regarding the importance of this vaccine. Advised may receive this vaccine at local pharmacy or Health Dept. Aware to provide a copy of the vaccination record if obtained from local pharmacy or Health Dept. Verbalized acceptance and understanding.  Covid-19 vaccine status: Completed vaccines  Qualifies for Shingles Vaccine? No   Zostavax completed Yes   Shingrix Completed?: Yes  Screening Tests Health Maintenance  Topic Date Due   OPHTHALMOLOGY EXAM  03/05/2018   Pneumonia Vaccine 52+ Years old (43 - PPSV23 if available, else PCV20) 05/12/2019   Zoster Vaccines- Shingrix (2 of 2) 03/11/2021   HEMOGLOBIN A1C  07/17/2021   FOOT EXAM  03/03/2022   TETANUS/TDAP  05/22/2023   COLONOSCOPY (Pts 45-55yrs Insurance coverage will need to be confirmed)  07/13/2027   INFLUENZA VACCINE  Completed   COVID-19 Vaccine  Completed   Hepatitis C Screening  Completed   HPV VACCINES  Aged Out    Health Maintenance  Health Maintenance Due  Topic Date Due   OPHTHALMOLOGY EXAM  03/05/2018   Pneumonia Vaccine 35+ Years old (37 - PPSV23 if available, else PCV20) 05/12/2019   Zoster Vaccines- Shingrix (2 of 2) 03/11/2021    Colorectal cancer screening: Type  of screening: Colonoscopy. Completed 07/12/2017. Repeat every 10 years  Lung Cancer Screening: (Low Dose CT Chest recommended if Age 71-80 years, 30 pack-year currently smoking OR have quit w/in 15years.) does not qualify.     Additional Screening:  Hepatitis C Screening: History of Hep C  Vision Screening: Recommended annual ophthalmology exams for early detection of  glaucoma and other disorders of the eye. Is the patient up to date with their annual eye exam?  Yes  Who is the provider or what is the name of the office in which the patient attends [facility]   Dental Screening: Recommended annual dental exams for proper oral hygiene  Community Resource Referral / Chronic Care Management: CRR required this visit?  No   CCM required this visit?  No      Plan:     I have personally reviewed and noted the following in the patient's chart:   Medical and social history Use of alcohol, tobacco or illicit drugs  Current medications and supplements including opioid prescriptions. Patient is not currently taking opioid prescriptions. Functional ability and status Nutritional status Physical activity Advanced directives List of other physicians Hospitalizations, surgeries, and ER visits in previous 12 months Vitals Screenings to include cognitive, depression, and falls Referrals and appointments  In addition, I have reviewed and discussed with patient certain preventive protocols, quality metrics, and best practice recommendations. A written personalized care plan for preventive services as well as general preventive health recommendations were provided to patient.   Due to this being a telephonic visit, the after visit summary with patients personalized plan was offered to patient via mail or my-chart.  Patient would like to access on my-chart.  Marta Antu, LPN   [id number]   Nurse Notes: None  I have reviewed and agree with [facility] Coaches documentation.  Kathlene November, MD

## 2021-04-04 NOTE — Patient Instructions (Signed)
Jeffery York , Thank you for taking time to complete your Medicare Wellness Visit. I appreciate your ongoing commitment to your health goals. Please review the following plan we discussed and let me know if I can assist you in the future.   Screening recommendations/referrals: Colonoscopy: Completed 07/12/2017-Due 07/13/2027 Recommended yearly ophthalmology/optometry visit for glaucoma screening and checkup Recommended yearly dental visit for hygiene and checkup  Vaccinations: Influenza vaccine: Up to date Pneumococcal vaccine: Discuss with PCP Tdap vaccine: Up to date-Due-05/22/2023 Shingles vaccine: Completed first dose.   Covid-19: Up to date  Advanced directives: Please bring a copy of Living Will and/or Healthcare Power of Attorney for your chart.   Conditions/risks identified: See problem list  Next appointment: Follow up in one year for your annual wellness visit. 04/10/2022 @ 11:00  Preventive Care 65 Years and Older, Male Preventive care refers to lifestyle choices and visits with your health care provider that can promote health and wellness. What does preventive care include? A yearly physical exam. This is also called an annual well check. Dental exams once or twice a year. Routine eye exams. Ask your health care provider how often you should have your eyes checked. Personal lifestyle choices, including: Daily care of your teeth and gums. Regular physical activity. Eating a healthy diet. Avoiding tobacco and drug use. Limiting alcohol use. Practicing safe sex. Taking low doses of aspirin every day. Taking vitamin and mineral supplements as recommended by your health care provider. What happens during an annual well check? The services and screenings done by your health care provider during your annual well check will depend on your age, overall health, lifestyle risk factors, and family history of disease. Counseling  Your health care provider may ask you questions about  your: Alcohol use. Tobacco use. Drug use. Emotional well-being. Home and relationship well-being. Sexual activity. Eating habits. History of falls. Memory and ability to understand (cognition). Work and work Statistician. Screening  You may have the following tests or measurements: Height, weight, and BMI. Blood pressure. Lipid and cholesterol levels. These may be checked every 5 years, or more frequently if you are over 81 years old. Skin check. Lung cancer screening. You may have this screening every year starting at age 44 if you have a 30-pack-year history of smoking and currently smoke or have quit within the past 15 years. Fecal occult blood test (FOBT) of the stool. You may have this test every year starting at age 70. Flexible sigmoidoscopy or colonoscopy. You may have a sigmoidoscopy every 5 years or a colonoscopy every 10 years starting at age 2. Prostate cancer screening. Recommendations will vary depending on your family history and other risks. Hepatitis C blood test. Hepatitis B blood test. Sexually transmitted disease (STD) testing. Diabetes screening. This is done by checking your blood sugar (glucose) after you have not eaten for a while (fasting). You may have this done every 1-3 years. Abdominal aortic aneurysm (AAA) screening. You may need this if you are a current or former smoker. Osteoporosis. You may be screened starting at age 26 if you are at high risk. Talk with your health care provider about your test results, treatment options, and if necessary, the need for more tests. Vaccines  Your health care provider may recommend certain vaccines, such as: Influenza vaccine. This is recommended every year. Tetanus, diphtheria, and acellular pertussis (Tdap, Td) vaccine. You may need a Td booster every 10 years. Zoster vaccine. You may need this after age 68. Pneumococcal 13-valent conjugate (PCV13) vaccine.  One dose is recommended after age 10. Pneumococcal  polysaccharide (PPSV23) vaccine. One dose is recommended after age 59. Talk to your health care provider about which screenings and vaccines you need and how often you need them. This information is not intended to replace advice given to you by your health care provider. Make sure you discuss any questions you have with your health care provider. Document Released: 06/18/2015 Document Revised: 02/09/2016 Document Reviewed: 03/23/2015 Elsevier Interactive Patient Education  2017 [employer] Prevention in the Home Falls can cause injuries. They can happen to people of all ages. There are many things you can do to make your home safe and to help prevent falls. What can I do on the outside of my home? Regularly fix the edges of walkways and driveways and fix any cracks. Remove anything that might make you trip as you walk through a door, such as a raised step or threshold. Trim any bushes or trees on the path to your home. Use bright outdoor lighting. Clear any walking paths of anything that might make someone trip, such as rocks or tools. Regularly check to see if handrails are loose or broken. Make sure that both sides of any steps have handrails. Any raised decks and porches should have guardrails on the edges. Have any leaves, snow, or ice cleared regularly. Use sand or salt on walking paths during winter. Clean up any spills in your garage right away. This includes oil or grease spills. What can I do in the bathroom? Use night lights. Install grab bars by the toilet and in the tub and shower. Do not use towel bars as grab bars. Use non-skid mats or decals in the tub or shower. If you need to sit down in the shower, use a plastic, non-slip stool. Keep the floor dry. Clean up any water that spills on the floor as soon as it happens. Remove soap buildup in the tub or shower regularly. Attach bath mats securely with double-sided non-slip rug tape. Do not have throw rugs and other  things on the floor that can make you trip. What can I do in the bedroom? Use night lights. Make sure that you have a light by your bed that is easy to reach. Do not use any sheets or blankets that are too big for your bed. They should not hang down onto the floor. Have a firm chair that has side arms. You can use this for support while you get dressed. Do not have throw rugs and other things on the floor that can make you trip. What can I do in the kitchen? Clean up any spills right away. Avoid walking on wet floors. Keep items that you use a lot in easy-to-reach places. If you need to reach something above you, use a strong step stool that has a grab bar. Keep electrical cords out of the way. Do not use floor polish or wax that makes floors slippery. If you must use wax, use non-skid floor wax. Do not have throw rugs and other things on the floor that can make you trip. What can I do with my stairs? Do not leave any items on the stairs. Make sure that there are handrails on both sides of the stairs and use them. Fix handrails that are broken or loose. Make sure that handrails are as long as the stairways. Check any carpeting to make sure that it is firmly attached to the stairs. Fix any carpet that is loose or  worn. Avoid having throw rugs at the top or bottom of the stairs. If you do have throw rugs, attach them to the floor with carpet tape. Make sure that you have a light switch at the top of the stairs and the bottom of the stairs. If you do not have them, ask someone to add them for you. What else can I do to help prevent falls? Wear shoes that: Do not have high heels. Have rubber bottoms. Are comfortable and fit you well. Are closed at the toe. Do not wear sandals. If you use a stepladder: Make sure that it is fully opened. Do not climb a closed stepladder. Make sure that both sides of the stepladder are locked into place. Ask someone to hold it for you, if possible. Clearly  mark and make sure that you can see: Any grab bars or handrails. First and last steps. Where the edge of each step is. Use tools that help you move around (mobility aids) if they are needed. These include: Canes. Walkers. Scooters. Crutches. Turn on the lights when you go into a dark area. Replace any light bulbs as soon as they burn out. Set up your furniture so you have a clear path. Avoid moving your furniture around. If any of your floors are uneven, fix them. If there are any pets around you, be aware of where they are. Review your medicines with your doctor. Some medicines can make you feel dizzy. This can increase your chance of falling. Ask your doctor what other things that you can do to help prevent falls. This information is not intended to replace advice given to you by your health care provider. Make sure you discuss any questions you have with your health care provider. Document Released: 03/18/2009 Document Revised: 10/28/2015 Document Reviewed: 06/26/2014 Elsevier Interactive Patient Education  2017 [employer].

## 2021-04-14 DIAGNOSIS — R972 Elevated prostate specific antigen [PSA]: Secondary | ICD-10-CM | POA: Diagnosis not present

## 2021-04-15 DIAGNOSIS — D849 Immunodeficiency, unspecified: Secondary | ICD-10-CM | POA: Diagnosis not present

## 2021-04-15 DIAGNOSIS — R1084 Generalized abdominal pain: Secondary | ICD-10-CM | POA: Diagnosis not present

## 2021-04-15 DIAGNOSIS — Z944 Liver transplant status: Secondary | ICD-10-CM | POA: Diagnosis not present

## 2021-06-05 DIAGNOSIS — Z20828 Contact with and (suspected) exposure to other viral communicable diseases: Secondary | ICD-10-CM | POA: Diagnosis not present

## 2021-07-08 LAB — HEMOGLOBIN A1C: Hemoglobin A1C: 6.8

## 2021-07-08 LAB — HM DIABETES EYE EXAM

## 2021-07-19 ENCOUNTER — Encounter: Payer: Self-pay | Admitting: Internal Medicine

## 2021-08-07 DIAGNOSIS — Z20822 Contact with and (suspected) exposure to covid-19: Secondary | ICD-10-CM | POA: Diagnosis not present

## 2021-08-11 ENCOUNTER — Encounter: Payer: Self-pay | Admitting: Internal Medicine

## 2021-09-22 DIAGNOSIS — Z20822 Contact with and (suspected) exposure to covid-19: Secondary | ICD-10-CM | POA: Diagnosis not present

## 2021-10-03 DIAGNOSIS — Z20822 Contact with and (suspected) exposure to covid-19: Secondary | ICD-10-CM | POA: Diagnosis not present

## 2021-10-08 DIAGNOSIS — Z20822 Contact with and (suspected) exposure to covid-19: Secondary | ICD-10-CM | POA: Diagnosis not present

## 2021-10-12 DIAGNOSIS — Z20828 Contact with and (suspected) exposure to other viral communicable diseases: Secondary | ICD-10-CM | POA: Diagnosis not present

## 2021-10-13 DIAGNOSIS — Z20822 Contact with and (suspected) exposure to covid-19: Secondary | ICD-10-CM | POA: Diagnosis not present

## 2021-10-26 DIAGNOSIS — Z20828 Contact with and (suspected) exposure to other viral communicable diseases: Secondary | ICD-10-CM | POA: Diagnosis not present

## 2021-10-27 DIAGNOSIS — D849 Immunodeficiency, unspecified: Secondary | ICD-10-CM | POA: Diagnosis not present

## 2021-10-27 DIAGNOSIS — Z944 Liver transplant status: Secondary | ICD-10-CM | POA: Diagnosis not present

## 2021-11-01 DIAGNOSIS — Z20828 Contact with and (suspected) exposure to other viral communicable diseases: Secondary | ICD-10-CM | POA: Diagnosis not present

## 2022-01-05 ENCOUNTER — Encounter: Payer: Self-pay | Admitting: Internal Medicine

## 2022-01-05 DIAGNOSIS — Z8739 Personal history of other diseases of the musculoskeletal system and connective tissue: Secondary | ICD-10-CM | POA: Diagnosis not present

## 2022-01-05 DIAGNOSIS — N6342 Unspecified lump in left breast, subareolar: Secondary | ICD-10-CM | POA: Diagnosis not present

## 2022-01-05 DIAGNOSIS — N529 Male erectile dysfunction, unspecified: Secondary | ICD-10-CM | POA: Diagnosis not present

## 2022-01-05 DIAGNOSIS — G4733 Obstructive sleep apnea (adult) (pediatric): Secondary | ICD-10-CM | POA: Diagnosis not present

## 2022-01-05 DIAGNOSIS — E119 Type 2 diabetes mellitus without complications: Secondary | ICD-10-CM | POA: Diagnosis not present

## 2022-01-05 DIAGNOSIS — D869 Sarcoidosis, unspecified: Secondary | ICD-10-CM | POA: Diagnosis not present

## 2022-01-05 DIAGNOSIS — R972 Elevated prostate specific antigen [PSA]: Secondary | ICD-10-CM | POA: Diagnosis not present

## 2022-01-05 DIAGNOSIS — E785 Hyperlipidemia, unspecified: Secondary | ICD-10-CM | POA: Diagnosis not present

## 2022-01-05 DIAGNOSIS — M8580 Other specified disorders of bone density and structure, unspecified site: Secondary | ICD-10-CM | POA: Diagnosis not present

## 2022-01-05 DIAGNOSIS — Z86711 Personal history of pulmonary embolism: Secondary | ICD-10-CM | POA: Diagnosis not present

## 2022-01-05 DIAGNOSIS — Z9989 Dependence on other enabling machines and devices: Secondary | ICD-10-CM | POA: Diagnosis not present

## 2022-01-05 DIAGNOSIS — I10 Essential (primary) hypertension: Secondary | ICD-10-CM | POA: Diagnosis not present

## 2022-01-10 DIAGNOSIS — N6342 Unspecified lump in left breast, subareolar: Secondary | ICD-10-CM | POA: Diagnosis not present

## 2022-01-10 DIAGNOSIS — N62 Hypertrophy of breast: Secondary | ICD-10-CM | POA: Diagnosis not present

## 2022-01-30 ENCOUNTER — Encounter: Payer: Self-pay | Admitting: Pulmonary Disease

## 2022-01-30 ENCOUNTER — Ambulatory Visit (INDEPENDENT_AMBULATORY_CARE_PROVIDER_SITE_OTHER): Payer: Medicare Other | Admitting: Pulmonary Disease

## 2022-01-30 VITALS — BP 130/70 | HR 67 | Ht 68.0 in | Wt 168.8 lb

## 2022-01-30 DIAGNOSIS — G4733 Obstructive sleep apnea (adult) (pediatric): Secondary | ICD-10-CM | POA: Diagnosis not present

## 2022-01-30 DIAGNOSIS — Z9989 Dependence on other enabling machines and devices: Secondary | ICD-10-CM

## 2022-01-30 NOTE — Patient Instructions (Signed)
Follow up in 1 year.

## 2022-01-30 NOTE — Progress Notes (Signed)
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  Brief Summary:  Jeffery York is a 66 y.o. male former smoker with obstructive sleep apnea.  He has hx of pulmonary sarcoidosis dx in 1990, s/p Rt upper lobectomy for aspergilloma in 2000.      Subjective:   Uses CPAP nightly.  Has some trouble with some of the connections.  Right sided pain better after he adjusted his diet (FODMAP).    He developed enlarged breast tissue.  Mammogram was benign.  Not having cough, wheeze, or sputum.  Physical Exam:   Appearance - well kempt   ENMT - no sinus tenderness, no oral exudate, no LAN, Mallampati 3 airway, no stridor  Respiratory - equal breath sounds bilaterally, no wheezing or rales  CV - s1s2 regular rate and rhythm, no murmurs  Ext - no clubbing, no edema  Skin - no rashes  Psych - normal mood and affect    Pulmonary testing:  ACE 01/20/20 >> 22  Chest Imaging:  CT chest 03/08/17 >> extensive scarring, s/p RULectomy CT angio chest 01/10/20 >> small PE in LUL and RML, GGO in RLL, s/p Rt upper lobectomy, apical scarring HRCT chest 08/14/20 >> no change in appearance for sarcoidosis  Sleep Tests:  HST 01/11/18 >> AHI 19.9, SaO2 low 78% Auto CPAP 11/01/21 to 01/29/22 >> used on 88 of 90 nights with average 6 hrs 47 min.  Average AHI 2.2 with median CPAP 7 and 95 th percentile CPAP 11 cm H2O  Social History:  He  reports that he quit smoking about  33 years ago. His smoking use included cigarettes. He has a 10.00 pack-year smoking history. He has never used smokeless tobacco. He reports that he does not drink alcohol and does not use drugs.  Family History:  His family history includes CAD in his brother; Diabetes in an other family member; Lung cancer in his father; Pancreatic cancer in his brother; Prostate cancer in his brother.     Assessment/Plan:   History of sarcoidosis with bronchiectasis. - monitor clinically  Unprovoked pulmonary embolism from August 2021. - gets eliquis from his PCP  Obstructive sleep apnea. - he is compliant with CPAP and reports benefit - uses [employer] for his DME - his current CPAP was ordered August 2019 - continue auto CPAP 5 to 15 cm H2O  S/p liver transplant for hx of hepatitis C with cirrhosis. - followed at [facility]  Time Spent Involved in Patient Care on Day of Examination:  26 minutes  Follow up:   Patient Instructions  Follow up in 1 year  Medication List:   Allergies as of 01/30/2022       Reactions   Benazepril Hcl Swelling   angioedema   Amlodipine Besy-benazepril Hcl Other (See Comments)   Erythromycin Ethylsuccinate    REACTION: GI Intolerance/vomiting   Keflex [cephalexin] Diarrhea   With cramps and Nausea  Terazosin Swelling, Other (See Comments)   Other reaction(s): Hypotension        Medication List        Accurate as of January 30, 2022 12:15 PM. If you have any questions, ask your nurse or doctor.          STOP taking these medications    Colchicine 0.6 MG Caps Stopped by: Chesley Mires, MD   Moderna COVID-19 Bival Booster 50 MCG/0.5ML injection Generic drug: COVID-19 mRNA bivalent vaccine (Moderna) Stopped by: Chesley Mires, MD       TAKE these medications    allopurinol 300 MG tablet Commonly known as: ZYLOPRIM Take 300 mg by mouth daily.   amLODipine 10 MG tablet Commonly known as: NORVASC Take 1 tablet (10 mg total) by mouth  daily.   apixaban 2.5 MG Tabs tablet Commonly known as: ELIQUIS Take 1 tablet (2.5 mg total) by mouth 2 (two) times daily.   ascorbic acid 500 MG tablet Commonly known as: VITAMIN C Take 1,000 mg by mouth daily.   atorvastatin 10 MG tablet Commonly known as: LIPITOR Take 1 tablet (10 mg total) by mouth every other day.   cetirizine 10 MG tablet Commonly known as: ZYRTEC Take 10 mg by mouth daily.   cholecalciferol 1000 units tablet Commonly known as: VITAMIN D Take 1,000 Units by mouth daily.   CoQ10 100 MG Caps Take 100 mg by mouth daily.   fluticasone 50 MCG/ACT nasal spray Commonly known as: FLONASE Place 2 sprays into both nostrils daily as needed for allergies.   hydrochlorothiazide 25 MG tablet Commonly known as: HYDRODIURIL Take 25 mg by mouth daily.   insulin glargine 100 UNIT/ML injection Commonly known as: LANTUS Inject 17 Units into the skin daily at 2 PM. Currently 21 units (17-20 uts can change)   Jardiance 25 MG Tabs tablet Generic drug: empagliflozin Take 12.5 mg by mouth daily.   magnesium oxide 400 MG tablet Commonly known as: MAG-OX Take 1 tablet by mouth 2 (two) times daily.   tacrolimus 1 MG capsule Commonly known as: PROGRAF Take 2-3 mg by mouth See admin instructions. Take 3 capsules every morning and take 2 capsules every evening        Signature:  Chesley Mires, MD [place] Pager - [phone] 01/30/2022, 12:15 PM

## 2022-02-09 LAB — PULMONARY FUNCTION TEST

## 2022-02-15 ENCOUNTER — Telehealth: Payer: Self-pay | Admitting: Pulmonary Disease

## 2022-02-15 NOTE — Telephone Encounter (Signed)
Patient has a dental procedure in 5 days and they have advised him to discontinue the Eliquis starting today. Patient states he had a episode coughing up blood and would like to know what to do.  Please advise- call back at [phone].

## 2022-02-15 NOTE — Telephone Encounter (Signed)
Called and spoke with patient.  Patient stated he has stopped Eliquis today, because he is having his wisdom teeth removed 02/20/22.  Patient was instructed by Dentist to stop 5 days prior to procedure. Patient stated he felt some congestion on his side while he was laying down. Patient stated he coughed and had a small amount of bright red sputum, mixed with clear sputum.  Patient stated this happened twice.  Patient is concerned and wanted to make sure he is ok.  Patient is seen by Dr. Halford Chessman, but Dr. Halford Chessman is gone for today.  Message routed to Dr. Valeta Harms DOD to advise.

## 2022-02-15 NOTE — Telephone Encounter (Signed)
Called and spoke with patient.  Dr. Juline Patch recommendations given.  Understanding stated.  Nothing further at this time.

## 2022-03-06 ENCOUNTER — Encounter: Payer: Medicare Other | Admitting: Internal Medicine

## 2022-03-06 DIAGNOSIS — Z23 Encounter for immunization: Secondary | ICD-10-CM | POA: Diagnosis not present

## 2022-04-10 ENCOUNTER — Ambulatory Visit: Payer: Medicare Other

## 2022-06-14 ENCOUNTER — Observation Stay (HOSPITAL_COMMUNITY)
Admission: EM | Admit: 2022-06-14 | Discharge: 2022-06-16 | Disposition: A | Discharge status: Home / Self Care | Payer: Medicare PPO | Attending: Internal Medicine | Admitting: Internal Medicine

## 2022-06-14 ENCOUNTER — Encounter (HOSPITAL_COMMUNITY): Payer: Self-pay

## 2022-06-14 ENCOUNTER — Other Ambulatory Visit: Payer: Self-pay

## 2022-06-14 ENCOUNTER — Emergency Department (HOSPITAL_COMMUNITY): Payer: Medicare PPO

## 2022-06-14 DIAGNOSIS — Z794 Long term (current) use of insulin: Secondary | ICD-10-CM | POA: Insufficient documentation

## 2022-06-14 DIAGNOSIS — R042 Hemoptysis: Secondary | ICD-10-CM | POA: Diagnosis present

## 2022-06-14 DIAGNOSIS — Z8616 Personal history of COVID-19: Secondary | ICD-10-CM | POA: Insufficient documentation

## 2022-06-14 DIAGNOSIS — Z8619 Personal history of other infectious and parasitic diseases: Secondary | ICD-10-CM | POA: Insufficient documentation

## 2022-06-14 DIAGNOSIS — Z944 Liver transplant status: Secondary | ICD-10-CM | POA: Insufficient documentation

## 2022-06-14 DIAGNOSIS — Z87891 Personal history of nicotine dependence: Secondary | ICD-10-CM | POA: Insufficient documentation

## 2022-06-14 DIAGNOSIS — Z79621 Long term (current) use of calcineurin inhibitor: Secondary | ICD-10-CM

## 2022-06-14 DIAGNOSIS — E785 Hyperlipidemia, unspecified: Secondary | ICD-10-CM | POA: Insufficient documentation

## 2022-06-14 DIAGNOSIS — D8689 Sarcoidosis of other sites: Secondary | ICD-10-CM | POA: Insufficient documentation

## 2022-06-14 DIAGNOSIS — R710 Precipitous drop in hematocrit: Secondary | ICD-10-CM | POA: Diagnosis present

## 2022-06-14 DIAGNOSIS — E119 Type 2 diabetes mellitus without complications: Secondary | ICD-10-CM

## 2022-06-14 DIAGNOSIS — N400 Enlarged prostate without lower urinary tract symptoms: Secondary | ICD-10-CM | POA: Diagnosis not present

## 2022-06-14 DIAGNOSIS — Z7901 Long term (current) use of anticoagulants: Secondary | ICD-10-CM | POA: Diagnosis not present

## 2022-06-14 DIAGNOSIS — Z8042 Family history of malignant neoplasm of prostate: Secondary | ICD-10-CM

## 2022-06-14 DIAGNOSIS — B479 Mycetoma, unspecified: Secondary | ICD-10-CM | POA: Diagnosis not present

## 2022-06-14 DIAGNOSIS — K7460 Unspecified cirrhosis of liver: Secondary | ICD-10-CM | POA: Diagnosis not present

## 2022-06-14 DIAGNOSIS — Z902 Acquired absence of lung [part of]: Secondary | ICD-10-CM | POA: Insufficient documentation

## 2022-06-14 DIAGNOSIS — Z7984 Long term (current) use of oral hypoglycemic drugs: Secondary | ICD-10-CM | POA: Diagnosis not present

## 2022-06-14 DIAGNOSIS — D869 Sarcoidosis, unspecified: Secondary | ICD-10-CM | POA: Diagnosis present

## 2022-06-14 DIAGNOSIS — I700 Atherosclerosis of aorta: Secondary | ICD-10-CM | POA: Diagnosis not present

## 2022-06-14 DIAGNOSIS — Z8505 Personal history of malignant neoplasm of liver: Secondary | ICD-10-CM | POA: Diagnosis not present

## 2022-06-14 DIAGNOSIS — Z833 Family history of diabetes mellitus: Secondary | ICD-10-CM

## 2022-06-14 DIAGNOSIS — D860 Sarcoidosis of lung: Secondary | ICD-10-CM | POA: Diagnosis not present

## 2022-06-14 DIAGNOSIS — Z79899 Other long term (current) drug therapy: Secondary | ICD-10-CM | POA: Insufficient documentation

## 2022-06-14 DIAGNOSIS — G4733 Obstructive sleep apnea (adult) (pediatric): Secondary | ICD-10-CM | POA: Diagnosis not present

## 2022-06-14 DIAGNOSIS — M810 Age-related osteoporosis without current pathological fracture: Secondary | ICD-10-CM | POA: Diagnosis present

## 2022-06-14 DIAGNOSIS — I2721 Secondary pulmonary arterial hypertension: Secondary | ICD-10-CM | POA: Diagnosis present

## 2022-06-14 DIAGNOSIS — Z86711 Personal history of pulmonary embolism: Secondary | ICD-10-CM | POA: Insufficient documentation

## 2022-06-14 DIAGNOSIS — R059 Cough, unspecified: Secondary | ICD-10-CM | POA: Diagnosis not present

## 2022-06-14 DIAGNOSIS — I10 Essential (primary) hypertension: Secondary | ICD-10-CM | POA: Diagnosis not present

## 2022-06-14 DIAGNOSIS — Z801 Family history of malignant neoplasm of trachea, bronchus and lung: Secondary | ICD-10-CM

## 2022-06-14 DIAGNOSIS — E11649 Type 2 diabetes mellitus with hypoglycemia without coma: Secondary | ICD-10-CM | POA: Insufficient documentation

## 2022-06-14 DIAGNOSIS — B1920 Unspecified viral hepatitis C without hepatic coma: Secondary | ICD-10-CM | POA: Diagnosis present

## 2022-06-14 DIAGNOSIS — M109 Gout, unspecified: Secondary | ICD-10-CM | POA: Insufficient documentation

## 2022-06-14 DIAGNOSIS — Z1152 Encounter for screening for COVID-19: Secondary | ICD-10-CM | POA: Diagnosis not present

## 2022-06-14 DIAGNOSIS — D84821 Immunodeficiency due to drugs: Secondary | ICD-10-CM | POA: Diagnosis present

## 2022-06-14 DIAGNOSIS — Z888 Allergy status to other drugs, medicaments and biological substances status: Secondary | ICD-10-CM

## 2022-06-14 DIAGNOSIS — Z800 Family history of malignant neoplasm of digestive organs: Secondary | ICD-10-CM

## 2022-06-14 DIAGNOSIS — R0602 Shortness of breath: Secondary | ICD-10-CM | POA: Diagnosis not present

## 2022-06-14 DIAGNOSIS — Z881 Allergy status to other antibiotic agents status: Secondary | ICD-10-CM

## 2022-06-14 DIAGNOSIS — Z8249 Family history of ischemic heart disease and other diseases of the circulatory system: Secondary | ICD-10-CM

## 2022-06-14 LAB — CBC WITH DIFFERENTIAL/PLATELET
Abs Immature Granulocytes: 0.01 10*3/uL (ref 0.00–0.07)
Basophils Absolute: 0 10*3/uL (ref 0.0–0.1)
Basophils Relative: 1 %
Eosinophils Absolute: 0.2 10*3/uL (ref 0.0–0.5)
Eosinophils Relative: 2 %
HCT: 46.7 % (ref 39.0–52.0)
Hemoglobin: 14.9 g/dL (ref 13.0–17.0)
Immature Granulocytes: 0 %
Lymphocytes Relative: 39 %
Lymphs Abs: 2.9 10*3/uL (ref 0.7–4.0)
MCH: 29.2 pg (ref 26.0–34.0)
MCHC: 31.9 g/dL (ref 30.0–36.0)
MCV: 91.4 fL (ref 80.0–100.0)
Monocytes Absolute: 0.7 10*3/uL (ref 0.1–1.0)
Monocytes Relative: 9 %
Neutro Abs: 3.7 10*3/uL (ref 1.7–7.7)
Neutrophils Relative %: 49 %
Platelets: 273 10*3/uL (ref 150–400)
RBC: 5.11 MIL/uL (ref 4.22–5.81)
RDW: 14 % (ref 11.5–15.5)
WBC: 7.5 10*3/uL (ref 4.0–10.5)
nRBC: 0 % (ref 0.0–0.2)

## 2022-06-14 LAB — RESP PANEL BY RT-PCR (RSV, FLU A&B, COVID)  RVPGX2
Influenza A by PCR: NEGATIVE
Influenza B by PCR: NEGATIVE
Resp Syncytial Virus by PCR: NEGATIVE
SARS Coronavirus 2 by RT PCR: NEGATIVE

## 2022-06-14 NOTE — ED Triage Notes (Signed)
Pt had episode of Hemoptysis 3 days ago , last not and tonight but it was a lot.  Pt was diagnosed with COVID 06/05/22, is a Liver Transplant patient and on Eliquis for PE x2 - has been on Eliquis for approx 2 years

## 2022-06-14 NOTE — ED Provider Triage Note (Signed)
Emergency Medicine Provider Triage Evaluation Note  Jeffery York , a 67 y.o. male  was evaluated in triage.  Pt complains of hemoptysis.  Diagnosed with covid 06/05/22 and has had worsening symptoms since then.  He reports coughing up bright red blood, seems liquidy mixed with phlegm.  Does have hx of PE on eliquis.  Also has hx liver transplant, currently on prograf.  Review of Systems  Positive: SOB Negative: fever  Physical Exam  BP 133/76   Pulse 87   Temp 97.8 F (36.6 C)   Resp 16   Ht '5\' 8"'$  (1.727 m)   Wt 76.6 kg   SpO2 97%   BMI 25.68 kg/m   Gen:   Awake, no distress   Resp:  Normal effort, sats low at 85% during triage on RA MSK:   Moves extremities without difficulty  Other:    Medical Decision Making  Medically screening exam initiated at 10:15 PM.  Appropriate orders placed.  Jeffery York was informed that the remainder of the evaluation will be completed by another provider, this initial triage assessment does not replace that evaluation, and the importance of remaining in the ED until their evaluation is complete.  Hemoptysis.  Hx of PE on eliquis, also has active covid-19.  Hx liver transplant, on prograf.  Hypoxic in triage to 85%, placed on 2L with improvement to 97%.  EKG, labs, CXR, CT angio to assess for recurrent PE.  Made acuity 2, will prioritize room assignment.   Jeffery Pickett, PA-C 06/14/22 2221

## 2022-06-15 ENCOUNTER — Ambulatory Visit: Payer: Medicare Other | Admitting: Internal Medicine

## 2022-06-15 ENCOUNTER — Emergency Department (HOSPITAL_COMMUNITY): Payer: Medicare PPO

## 2022-06-15 DIAGNOSIS — Z881 Allergy status to other antibiotic agents status: Secondary | ICD-10-CM | POA: Diagnosis not present

## 2022-06-15 DIAGNOSIS — Z794 Long term (current) use of insulin: Secondary | ICD-10-CM

## 2022-06-15 DIAGNOSIS — R042 Hemoptysis: Secondary | ICD-10-CM | POA: Diagnosis not present

## 2022-06-15 DIAGNOSIS — Z8616 Personal history of COVID-19: Secondary | ICD-10-CM | POA: Diagnosis not present

## 2022-06-15 DIAGNOSIS — R710 Precipitous drop in hematocrit: Secondary | ICD-10-CM | POA: Diagnosis present

## 2022-06-15 DIAGNOSIS — I2721 Secondary pulmonary arterial hypertension: Secondary | ICD-10-CM | POA: Diagnosis present

## 2022-06-15 DIAGNOSIS — M810 Age-related osteoporosis without current pathological fracture: Secondary | ICD-10-CM | POA: Diagnosis present

## 2022-06-15 DIAGNOSIS — D869 Sarcoidosis, unspecified: Secondary | ICD-10-CM

## 2022-06-15 DIAGNOSIS — Z86711 Personal history of pulmonary embolism: Secondary | ICD-10-CM | POA: Diagnosis not present

## 2022-06-15 DIAGNOSIS — Z888 Allergy status to other drugs, medicaments and biological substances status: Secondary | ICD-10-CM | POA: Diagnosis not present

## 2022-06-15 DIAGNOSIS — Z7901 Long term (current) use of anticoagulants: Secondary | ICD-10-CM | POA: Diagnosis not present

## 2022-06-15 DIAGNOSIS — I10 Essential (primary) hypertension: Secondary | ICD-10-CM | POA: Diagnosis not present

## 2022-06-15 DIAGNOSIS — Z944 Liver transplant status: Secondary | ICD-10-CM | POA: Diagnosis not present

## 2022-06-15 DIAGNOSIS — D84821 Immunodeficiency due to drugs: Secondary | ICD-10-CM | POA: Diagnosis present

## 2022-06-15 DIAGNOSIS — Z87891 Personal history of nicotine dependence: Secondary | ICD-10-CM | POA: Diagnosis not present

## 2022-06-15 DIAGNOSIS — Z8505 Personal history of malignant neoplasm of liver: Secondary | ICD-10-CM | POA: Diagnosis not present

## 2022-06-15 DIAGNOSIS — M109 Gout, unspecified: Secondary | ICD-10-CM | POA: Diagnosis present

## 2022-06-15 DIAGNOSIS — E119 Type 2 diabetes mellitus without complications: Secondary | ICD-10-CM | POA: Diagnosis not present

## 2022-06-15 DIAGNOSIS — D849 Immunodeficiency, unspecified: Secondary | ICD-10-CM

## 2022-06-15 DIAGNOSIS — E785 Hyperlipidemia, unspecified: Secondary | ICD-10-CM | POA: Diagnosis present

## 2022-06-15 DIAGNOSIS — G4733 Obstructive sleep apnea (adult) (pediatric): Secondary | ICD-10-CM

## 2022-06-15 DIAGNOSIS — N400 Enlarged prostate without lower urinary tract symptoms: Secondary | ICD-10-CM | POA: Diagnosis present

## 2022-06-15 DIAGNOSIS — Z1152 Encounter for screening for COVID-19: Secondary | ICD-10-CM | POA: Diagnosis not present

## 2022-06-15 DIAGNOSIS — Z79899 Other long term (current) drug therapy: Secondary | ICD-10-CM | POA: Diagnosis not present

## 2022-06-15 DIAGNOSIS — Z7984 Long term (current) use of oral hypoglycemic drugs: Secondary | ICD-10-CM | POA: Diagnosis not present

## 2022-06-15 DIAGNOSIS — D860 Sarcoidosis of lung: Secondary | ICD-10-CM | POA: Diagnosis present

## 2022-06-15 LAB — COMPREHENSIVE METABOLIC PANEL
ALT: 24 U/L (ref 0–44)
ALT: 22 U/L (ref 0–44)
AST: 25 U/L (ref 15–41)
AST: 20 U/L (ref 15–41)
Albumin: 3.3 g/dL — ABNORMAL LOW (ref 3.5–5.0)
Albumin: 3.4 g/dL — ABNORMAL LOW (ref 3.5–5.0)
Alkaline Phosphatase: 70 U/L (ref 38–126)
Alkaline Phosphatase: 69 U/L (ref 38–126)
Anion gap: 8 (ref 5–15)
Anion gap: 7 (ref 5–15)
BUN: 37 mg/dL — ABNORMAL HIGH (ref 8–23)
BUN: 42 mg/dL — ABNORMAL HIGH (ref 8–23)
CO2: 28 mmol/L (ref 22–32)
CO2: 28 mmol/L (ref 22–32)
Calcium: 9.1 mg/dL (ref 8.9–10.3)
Calcium: 8.9 mg/dL (ref 8.9–10.3)
Chloride: 102 mmol/L (ref 98–111)
Chloride: 102 mmol/L (ref 98–111)
Creatinine, Ser: 1.36 mg/dL — ABNORMAL HIGH (ref 0.61–1.24)
Creatinine, Ser: 1.11 mg/dL (ref 0.61–1.24)
GFR, Estimated: 57 mL/min — ABNORMAL LOW (ref >60)
GFR, Estimated: >60 mL/min (ref >60)
Glucose, Bld: 158 mg/dL — ABNORMAL HIGH (ref 70–99)
Glucose, Bld: 122 mg/dL — ABNORMAL HIGH (ref 70–99)
Potassium: 4.2 mmol/L (ref 3.5–5.1)
Potassium: 4 mmol/L (ref 3.5–5.1)
Sodium: 138 mmol/L (ref 135–145)
Sodium: 137 mmol/L (ref 135–145)
Total Bilirubin: 0.6 mg/dL (ref 0.3–1.2)
Total Bilirubin: 0.7 mg/dL (ref 0.3–1.2)
Total Protein: 7.9 g/dL (ref 6.5–8.1)
Total Protein: 7.6 g/dL (ref 6.5–8.1)

## 2022-06-15 LAB — CBC WITH DIFFERENTIAL/PLATELET
Abs Immature Granulocytes: 0.02 10*3/uL (ref 0.00–0.07)
Basophils Absolute: 0 10*3/uL (ref 0.0–0.1)
Basophils Relative: 0 %
Eosinophils Absolute: 0.1 10*3/uL (ref 0.0–0.5)
Eosinophils Relative: 1 %
HCT: 43 % (ref 39.0–52.0)
Hemoglobin: 15.1 g/dL (ref 13.0–17.0)
Immature Granulocytes: 0 %
Lymphocytes Relative: 23 %
Lymphs Abs: 2.1 10*3/uL (ref 0.7–4.0)
MCH: 31.1 pg (ref 26.0–34.0)
MCHC: 35.1 g/dL (ref 30.0–36.0)
MCV: 88.7 fL (ref 80.0–100.0)
Monocytes Absolute: 0.7 10*3/uL (ref 0.1–1.0)
Monocytes Relative: 8 %
Neutro Abs: 6 10*3/uL (ref 1.7–7.7)
Neutrophils Relative %: 68 %
Platelets: 241 10*3/uL (ref 150–400)
RBC: 4.85 MIL/uL (ref 4.22–5.81)
RDW: 13.9 % (ref 11.5–15.5)
WBC: 8.9 10*3/uL (ref 4.0–10.5)
nRBC: 0 % (ref 0.0–0.2)

## 2022-06-15 LAB — BRAIN NATRIURETIC PEPTIDE: B Natriuretic Peptide: 12.4 pg/mL (ref 0.0–100.0)

## 2022-06-15 LAB — HEMOGLOBIN AND HEMATOCRIT, BLOOD
HCT: 45.4 % (ref 39.0–52.0)
Hemoglobin: 14.4 g/dL (ref 13.0–17.0)

## 2022-06-15 LAB — TROPONIN I (HIGH SENSITIVITY)
Troponin I (High Sensitivity): 10 ng/L (ref <18)
Troponin I (High Sensitivity): 11 ng/L (ref <18)

## 2022-06-15 LAB — HEMOGLOBIN A1C
Hgb A1c MFr Bld: 6.6 % — ABNORMAL HIGH (ref 4.8–5.6)
Mean Plasma Glucose: 142.72 mg/dL

## 2022-06-15 LAB — HIV ANTIBODY (ROUTINE TESTING W REFLEX): HIV Screen 4th Generation wRfx: NONREACTIVE

## 2022-06-15 LAB — GLUCOSE, CAPILLARY
Glucose-Capillary: 195 mg/dL — ABNORMAL HIGH (ref 70–99)
Glucose-Capillary: 148 mg/dL — ABNORMAL HIGH (ref 70–99)

## 2022-06-15 LAB — CBG MONITORING, ED: Glucose-Capillary: 68 mg/dL — ABNORMAL LOW (ref 70–99)

## 2022-06-15 LAB — AMMONIA: Ammonia: 36 umol/L — ABNORMAL HIGH (ref 9–35)

## 2022-06-15 LAB — MAGNESIUM: Magnesium: 2 mg/dL (ref 1.7–2.4)

## 2022-06-15 MED ORDER — PANTOPRAZOLE SODIUM 40 MG IV SOLR
40.0000 mg | Freq: Every day | INTRAVENOUS | Status: DC
  Administered 2022-06-15: 40 mg via INTRAVENOUS
  Filled 2022-06-15: qty 10

## 2022-06-15 MED ORDER — LACTATED RINGERS IV BOLUS
1000.0000 mL | Freq: Once | INTRAVENOUS | Status: AC
  Administered 2022-06-15: 1000 mL via INTRAVENOUS

## 2022-06-15 MED ORDER — TACROLIMUS 1 MG PO CAPS
2.0000 mg | ORAL_CAPSULE | Freq: Every day | ORAL | Status: DC
  Administered 2022-06-15: 2 mg via ORAL
  Filled 2022-06-15 (×3): qty 2

## 2022-06-15 MED ORDER — AMLODIPINE BESYLATE 10 MG PO TABS
10.0000 mg | ORAL_TABLET | Freq: Every day | ORAL | Status: DC
  Administered 2022-06-15 – 2022-06-16 (×2): 10 mg via ORAL
  Filled 2022-06-15: qty 1
  Filled 2022-06-15: qty 2

## 2022-06-15 MED ORDER — ATORVASTATIN CALCIUM 10 MG PO TABS
10.0000 mg | ORAL_TABLET | ORAL | Status: DC
  Administered 2022-06-15: 10 mg via ORAL
  Filled 2022-06-15 (×2): qty 1

## 2022-06-15 MED ORDER — INSULIN ASPART 100 UNIT/ML IJ SOLN
0.0000 [IU] | Freq: Three times a day (TID) | INTRAMUSCULAR | Status: DC
  Administered 2022-06-15: 3 [IU] via SUBCUTANEOUS

## 2022-06-15 MED ORDER — ORAL CARE MOUTH RINSE: 15.0000 mL | OROMUCOSAL | Status: DC | PRN

## 2022-06-15 MED ORDER — ACETAMINOPHEN 650 MG RE SUPP: 650.0000 mg | Freq: Four times a day (QID) | RECTAL | Status: DC | PRN

## 2022-06-15 MED ORDER — IOHEXOL 350 MG/ML SOLN
75.0000 mL | Freq: Once | INTRAVENOUS | Status: AC | PRN
  Administered 2022-06-15: 75 mL via INTRAVENOUS

## 2022-06-15 MED ORDER — INSULIN GLARGINE-YFGN 100 UNIT/ML ~~LOC~~ SOLN
10.0000 [IU] | Freq: Every day | SUBCUTANEOUS | Status: DC
  Filled 2022-06-15 (×3): qty 0.1

## 2022-06-15 MED ORDER — FLUTICASONE PROPIONATE 50 MCG/ACT NA SUSP: 2.0000 | Freq: Every day | NASAL | Status: DC | PRN

## 2022-06-15 MED ORDER — ACETAMINOPHEN 325 MG PO TABS: 650.0000 mg | ORAL_TABLET | Freq: Four times a day (QID) | ORAL | Status: DC | PRN

## 2022-06-15 MED ORDER — POTASSIUM CHLORIDE IN NACL 20-0.9 MEQ/L-% IV SOLN
INTRAVENOUS | Status: DC
  Filled 2022-06-15: qty 1000

## 2022-06-15 MED ORDER — PIPERACILLIN-TAZOBACTAM 3.375 G IVPB
3.3750 g | Freq: Three times a day (TID) | INTRAVENOUS | Status: DC
  Administered 2022-06-15 – 2022-06-16 (×4): 3.375 g via INTRAVENOUS
  Filled 2022-06-15 (×5): qty 50

## 2022-06-15 MED ORDER — SODIUM CHLORIDE 0.9 % IV SOLN: INTRAVENOUS | Status: DC

## 2022-06-15 MED ORDER — TACROLIMUS 1 MG PO CAPS
3.0000 mg | ORAL_CAPSULE | Freq: Every day | ORAL | Status: DC
  Administered 2022-06-15 – 2022-06-16 (×2): 3 mg via ORAL
  Filled 2022-06-15 (×2): qty 3

## 2022-06-15 MED ORDER — VITAMIN D 25 MCG (1000 UNIT) PO TABS
1000.0000 [IU] | ORAL_TABLET | Freq: Every day | ORAL | Status: DC
  Administered 2022-06-15 – 2022-06-16 (×2): 1000 [IU] via ORAL
  Filled 2022-06-15 (×2): qty 1

## 2022-06-15 NOTE — ED Notes (Signed)
ED TO INPATIENT HANDOFF REPORT  ED Nurse Name and Phone #:  Gearold Wainer [phone]  S Name/Age/Gender Jeffery York 67 y.o. male Room/Bed: 043C/043C  Code Status   Code Status: Full Code  Home/SNF/Other Home Patient oriented to: situation Is this baseline? Yes   Triage Complete: Triage complete  Chief Complaint Hemoptysis [R04.2]  Triage Note Pt had episode of Hemoptysis 3 days ago , last not and tonight but it was a lot.  Pt was diagnosed with COVID 06/05/22, is a Liver Transplant patient and on Eliquis for PE x2 - has been on Eliquis for approx 2 years   Allergies Allergies  Allergen Reactions   Benazepril Hcl Swelling    angioedema   Amlodipine Besy-Benazepril Hcl Other (See Comments)   Erythromycin Ethylsuccinate     REACTION: GI Intolerance/vomiting   Keflex [Cephalexin] Diarrhea    With cramps and Nausea   Terazosin Swelling and Other (See Comments)    Other reaction(s): Hypotension    Level of Care/Admitting Diagnosis ED Disposition     ED Disposition  Admit   Condition  --   [place]: [place] [[id number]]  Level of Care: Telemetry Medical [[id number]]  May admit patient to [facility] or [facility] if equivalent level of care is available:: Yes  Covid Evaluation: Confirmed COVID Negative  Diagnosis: Hemoptysis [[id number]]  Admitting Physician: Quintella Baton [[id number]]  Attending Physician: Quintella Baton [[id number]]  Certification:: I certify this patient will need inpatient services for at least 2 midnights  Estimated Length of Stay: 2          B Medical/Surgery History Past Medical History:  Diagnosis Date   Benign prostatic hypertrophy    Cirrhosis ([place])    Diabetes mellitus    initially 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 but then the diabetes resurface without the use of steroids   HCC (hepatocellular carcinoma) ([place])    s/p TACE and microwave ablation   Hep C w/o coma, chronic ([place])    bx aprox 12-10, s/p [facility], on a transplant list candidate     History of sarcoidosis    used steroids x 10 years   Hyperlipidemia    Hypertension    Liver transplant recipient ([place]) 2016   OSA (obstructive sleep apnea) 01/15/2018   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   Uveitis    h/o   Past Surgical History:  Procedure Laterality Date   LIVER TRANSPLANT  04-2015   lung resection aspergilloma  2000     A IV Location/Drains/Wounds Patient Lines/Drains/Airways Status     Active Line/Drains/Airways     Name Placement date Placement time Site Days   Peripheral IV 06/14/22 20 G 1" Anterior;Distal;Right;Upper Arm 06/14/22  2252  Arm  1   Peripheral IV 06/15/22 20 G Posterior;Right Forearm 06/15/22  0455  Forearm  less than 1            Intake/Output Last 24 hours  Intake/Output Summary (Last 24 hours) at 06/15/2022 1245 Last data filed at 06/15/2022 1154 Gross per 24 hour  Intake 1544.48 ml  Output --  Net 1544.48 ml    Labs/Imaging Results for orders placed or performed during the hospital encounter of 06/14/22 (from the past 48 hour(s))  Brain natriuretic peptide     Status: None   Collection Time: 06/14/22 10:15 PM  Result Value Ref Range   B Natriuretic Peptide 12.4 0.0 - 100.0 pg/mL    Comment: Performed at [facility], [address], [place], [place] [address]  Resp panel by RT-PCR (RSV, Flu A&B, Covid) Anterior Nasal Swab     Status: None   Collection Time: 06/14/22 10:16 PM   Specimen: Anterior Nasal Swab  Result Value Ref Range   SARS Coronavirus 2 by RT PCR NEGATIVE NEGATIVE    Comment: (NOTE) SARS-CoV-2 target nucleic acids are NOT DETECTED.  The SARS-CoV-2 RNA is generally detectable in upper respiratory specimens during the acute phase of infection. The lowest concentration of SARS-CoV-2 viral copies this assay can detect is 138 copies/mL. A negative result does not preclude SARS-Cov-2 infection and should not be used as the sole basis for treatment or other patient management decisions. A negative  result may occur with  improper specimen collection/handling, submission of specimen other than nasopharyngeal swab, presence of viral mutation(s) within the areas targeted by this assay, and inadequate number of viral copies(<138 copies/mL). A negative result must be combined with clinical observations, patient history, and epidemiological information. The expected result is Negative.  Fact Sheet for Patients:  EntrepreneurPulse.com.au  Fact Sheet for Healthcare Providers:  IncredibleEmployment.be  This test is no t yet approved or cleared by the [place] FDA and  has been authorized for detection and/or diagnosis of SARS-CoV-2 by FDA under an Emergency Use Authorization (EUA). This EUA will remain  in effect (meaning this test can be used) for the duration of the COVID-19 declaration under Section 564(b)(1) of the Act, 21 U.S.C.section 360bbb-3(b)(1), unless the authorization is terminated  or revoked sooner.       Influenza A by PCR NEGATIVE NEGATIVE   Influenza B by PCR NEGATIVE NEGATIVE    Comment: (NOTE) The Xpert Xpress SARS-CoV-2/FLU/RSV plus assay is intended as an aid in the diagnosis of influenza from Nasopharyngeal swab specimens and should not be used as a sole basis for treatment. Nasal washings and aspirates are unacceptable for Xpert Xpress SARS-CoV-2/FLU/RSV testing.  Fact Sheet for Patients: EntrepreneurPulse.com.au  Fact Sheet for Healthcare Providers: IncredibleEmployment.be  This test is not yet approved or cleared by the [place] FDA and has been authorized for detection and/or diagnosis of SARS-CoV-2 by FDA under an Emergency Use Authorization (EUA). This EUA will remain in effect (meaning this test can be used) for the duration of the COVID-19 declaration under Section 564(b)(1) of the Act, 21 U.S.C. section 360bbb-3(b)(1), unless the authorization is terminated  or revoked.     Resp Syncytial Virus by PCR NEGATIVE NEGATIVE    Comment: (NOTE) Fact Sheet for Patients: EntrepreneurPulse.com.au  Fact Sheet for Healthcare Providers: IncredibleEmployment.be  This test is not yet approved or cleared by the [place] FDA and has been authorized for detection and/or diagnosis of SARS-CoV-2 by FDA under an Emergency Use Authorization (EUA). This EUA will remain in effect (meaning this test can be used) for the duration of the COVID-19 declaration under Section 564(b)(1) of the Act, 21 U.S.C. section 360bbb-3(b)(1), unless the authorization is terminated or revoked.  Performed at [facility], [place] [address] [place],  [address]   CBC with Differential     Status: None   Collection Time: 06/14/22 10:22 PM  Result Value Ref Range   WBC 7.5 4.0 - 10.5 K/uL   RBC 5.11 4.22 - 5.81 MIL/uL   Hemoglobin 14.9 13.0 - 17.0 g/dL   HCT 46.7 39.0 - 52.0 %   MCV 91.4 80.0 - 100.0 fL   MCH 29.2 26.0 - 34.0 pg   MCHC 31.9 30.0 - 36.0 g/dL   RDW  14.0 11.5 - 15.5 %   Platelets 273 150 - 400 K/uL   nRBC 0.0 0.0 - 0.2 %   Neutrophils Relative % 49 %   Neutro Abs 3.7 1.7 - 7.7 K/uL   Lymphocytes Relative 39 %   Lymphs Abs 2.9 0.7 - 4.0 K/uL   Monocytes Relative 9 %   Monocytes Absolute 0.7 0.1 - 1.0 K/uL   Eosinophils Relative 2 %   Eosinophils Absolute 0.2 0.0 - 0.5 K/uL   Basophils Relative 1 %   Basophils Absolute 0.0 0.0 - 0.1 K/uL   Immature Granulocytes 0 %   Abs Immature Granulocytes 0.01 0.00 - 0.07 K/uL    Comment: Performed at [facility] [address], [place], [place] [address]  Comprehensive metabolic panel     Status: Abnormal   Collection Time: 06/14/22 10:22 PM  Result Value Ref Range   Sodium 138 135 - 145 mmol/L   Potassium 4.2 3.5 - 5.1 mmol/L   Chloride 102 98 - 111 mmol/L   CO2 28 22 - 32 mmol/L   Glucose, Bld 158 (H) 70 - 99 mg/dL    Comment: Glucose reference range  applies only to samples taken after fasting for at least 8 hours.   BUN 37 (H) 8 - 23 mg/dL   Creatinine, Ser 1.36 (H) 0.61 - 1.24 mg/dL   Calcium 9.1 8.9 - 10.3 mg/dL   Total Protein 7.9 6.5 - 8.1 g/dL   Albumin 3.3 (L) 3.5 - 5.0 g/dL   AST 25 15 - 41 U/L   ALT 24 0 - 44 U/L   Alkaline Phosphatase 70 38 - 126 U/L   Total Bilirubin 0.6 0.3 - 1.2 mg/dL   GFR, Estimated 57 (L) >60 mL/min    Comment: (NOTE) Calculated using the CKD-EPI Creatinine Equation (2021)    Anion gap 8 5 - 15    Comment: Performed at [facility] [address], [place], [place] [address]  Troponin I (High Sensitivity)     Status: None   Collection Time: 06/14/22 10:22 PM  Result Value Ref Range   Troponin I (High Sensitivity) 10 <18 ng/L    Comment: (NOTE) Elevated high sensitivity troponin I (hsTnI) values and significant  changes across serial measurements may suggest ACS but many other  chronic and acute conditions are known to elevate hsTnI results.  Refer to the "Links" section for chest pain algorithms and additional  guidance. Performed at [facility], [place] [address], [place], [place] [address]   Troponin I (High Sensitivity)     Status: None   Collection Time: 06/15/22  1:10 AM  Result Value Ref Range   Troponin I (High Sensitivity) 11 <18 ng/L    Comment: (NOTE) Elevated high sensitivity troponin I (hsTnI) values and significant  changes across serial measurements may suggest ACS but many other  chronic and acute conditions are known to elevate hsTnI results.  Refer to the "Links" section for chest pain algorithms and additional  guidance. Performed at [facility], [place] [address], [place],  [address]   Culture, blood (Routine X 2) w Reflex to ID Panel     Status: None (Preliminary result)   Collection Time: 06/15/22  4:00 AM   Specimen: BLOOD LEFT ARM  Result Value Ref Range   Specimen Description BLOOD LEFT ARM    Special Requests      BOTTLES DRAWN AEROBIC  AND ANAEROBIC Blood Culture adequate volume   Culture  NO GROWTH < 12 HOURS Performed at [facility] [address], [place], [place] [address]    Report Status PENDING   Culture, blood (Routine X 2) w Reflex to ID Panel     Status: None (Preliminary result)   Collection Time: 06/15/22  4:25 AM   Specimen: BLOOD RIGHT ARM  Result Value Ref Range   Specimen Description BLOOD RIGHT ARM    Special Requests      BOTTLES DRAWN AEROBIC AND ANAEROBIC Blood Culture adequate volume   Culture      NO GROWTH < 12 HOURS Performed at [facility], [place] [address] [place], [place] [address]    Report Status PENDING   Ammonia     Status: Abnormal   Collection Time: 06/15/22  4:37 AM  Result Value Ref Range   Ammonia 36 (H) 9 - 35 umol/L    Comment: Performed at [facility], [place] [address], [place], [place] [address]  CBC with Differential/Platelet     Status: None   Collection Time: 06/15/22  4:37 AM  Result Value Ref Range   WBC 8.9 4.0 - 10.5 K/uL   RBC 4.85 4.22 - 5.81 MIL/uL   Hemoglobin 15.1 13.0 - 17.0 g/dL   HCT 43.0 39.0 - 52.0 %   MCV 88.7 80.0 - 100.0 fL   MCH 31.1 26.0 - 34.0 pg   MCHC 35.1 30.0 - 36.0 g/dL   RDW 13.9 11.5 - 15.5 %   Platelets 241 150 - 400 K/uL   nRBC 0.0 0.0 - 0.2 %   Neutrophils Relative % 68 %   Neutro Abs 6.0 1.7 - 7.7 K/uL   Lymphocytes Relative 23 %   Lymphs Abs 2.1 0.7 - 4.0 K/uL   Monocytes Relative 8 %   Monocytes Absolute 0.7 0.1 - 1.0 K/uL   Eosinophils Relative 1 %   Eosinophils Absolute 0.1 0.0 - 0.5 K/uL   Basophils Relative 0 %   Basophils Absolute 0.0 0.0 - 0.1 K/uL   Immature Granulocytes 0 %   Abs Immature Granulocytes 0.02 0.00 - 0.07 K/uL    Comment: Performed at [facility], [address] [place], [place] [address]  Comprehensive metabolic panel     Status: Abnormal   Collection Time: 06/15/22  4:37 AM  Result Value Ref Range   Sodium 137 135 - 145 mmol/L   Potassium 4.0 3.5 - 5.1 mmol/L    Chloride 102 98 - 111 mmol/L   CO2 28 22 - 32 mmol/L   Glucose, Bld 122 (H) 70 - 99 mg/dL    Comment: Glucose reference range applies only to samples taken after fasting for at least 8 hours.   BUN 42 (H) 8 - 23 mg/dL   Creatinine, Ser 1.11 0.61 - 1.24 mg/dL   Calcium 8.9 8.9 - 10.3 mg/dL   Total Protein 7.6 6.5 - 8.1 g/dL   Albumin 3.4 (L) 3.5 - 5.0 g/dL   AST 20 15 - 41 U/L   ALT 22 0 - 44 U/L   Alkaline Phosphatase 69 38 - 126 U/L   Total Bilirubin 0.7 0.3 - 1.2 mg/dL   GFR, Estimated >60 >60 mL/min    Comment: (NOTE) Calculated using the CKD-EPI Creatinine Equation (2021)    Anion gap 7 5 - 15    Comment: Performed at [facility] [address], [place],  [address]  Hemoglobin A1c     Status: Abnormal  Collection Time: 06/15/22  4:37 AM  Result Value Ref Range   Hgb A1c MFr Bld 6.6 (H) 4.8 - 5.6 %    Comment: (NOTE) Pre diabetes:          5.7%-6.4%  Diabetes:              >6.4%  Glycemic control for   <7.0% adults with diabetes    Mean Plasma Glucose 142.72 mg/dL    Comment: Performed at [facility] [address] [place], [place] [address]  CBG monitoring, ED     Status: Abnormal   Collection Time: 06/15/22 11:48 AM  Result Value Ref Range   Glucose-Capillary 68 (L) 70 - 99 mg/dL    Comment: Glucose reference range applies only to samples taken after fasting for at least 8 hours.   CT Angio Chest PE W and/or Wo Contrast  Result Date: 06/15/2022 CLINICAL DATA:  Pulmonary embolism (PE) suspected, high prob hx of PE, hemoptysis EXAM: CT ANGIOGRAPHY CHEST WITH CONTRAST TECHNIQUE: Multidetector CT imaging of the chest was performed using the standard protocol during bolus administration of intravenous contrast. Multiplanar CT image reconstructions and MIPs were obtained to evaluate the vascular anatomy. RADIATION DOSE REDUCTION: This exam was performed according to the departmental dose-optimization program which includes automated exposure control,  adjustment of the mA and/or kV according to patient size and/or use of iterative reconstruction technique. CONTRAST:  68m OMNIPAQUE IOHEXOL 350 MG/ML SOLN COMPARISON:  08/13/2020 FINDINGS: Cardiovascular: There is adequate opacification of the pulmonary arterial tree. No intraluminal filling defect identified to suggest acute pulmonary embolism. The central pulmonary arteries are mildly enlarged suggesting changes of pulmonary arterial hypertension. Moderate coronary artery calcification within the left anterior descending and ramus intermedius arteries. Cardiac size within normal limits. No pericardial effusion. Mild atherosclerotic calcification within the thoracic aorta. No aortic aneurysm. The bronchial arteries are enlarged. Left bronchial artery is not optimally delineated given the early phase of enhancement. The right bronchial artery, however, is seen arising anteriorly from the aorta at the level of the superior endplate of T6 (image # 104/6). Mediastinum/Nodes: No enlarged mediastinal, hilar, or axillary lymph nodes. Thyroid gland, trachea, and esophagus demonstrate no significant findings. Lungs/Pleura: There is architectural distortion with cylindrical bronchiectasis, honeycombing, volume loss, and superior height hilar retraction within the lung apices bilaterally in keeping with chronic changes of pulmonary sarcoidosis. Milder changes are noted within the superior segment of the lower lobes bilaterally. These findings appear similar in severity to prior examination. The previously noted cavitary lesion within the right upper lobe now demonstrates rounded soft tissue which is suspicious for a developing mycetoma, best seen on axial image # 30/7 and coronal image # 71/8. Additionally, there has developed extensive ground-glass and consolidative infiltrate within the superior segment of the right lower lobe. Inflammatory appearing ground-glass nodularity is seen within the right upper lobe and left  lower lobe. These findings may relate to multifocal pneumonia. Alternatively, pulmonary hemorrhage with endobronchial extension/aspiration of hemorrhage could appear similarly. No pneumothorax or pleural effusion. No central obstructing lesion. Upper Abdomen: No acute abnormality. Musculoskeletal: No acute bone abnormality. No lytic or blastic bone lesion. Review of the MIP images confirms the above findings. IMPRESSION: 1. No pulmonary embolism. 2. Morphologic changes in keeping with pulmonary arterial hypertension. 3. Moderate coronary artery calcification. 4. Chronic changes of pulmonary sarcoidosis, similar in severity to prior examination. 5. Interval development of rounded soft tissue mass within the previously noted cavitary lesion within the right upper lobe suspicious for  a developing mycetoma. 6. Interval development of extensive ground-glass and consolidative infiltrate within the superior segment of the right lower lobe. Inflammatory appearing ground-glass nodularity is seen within the right upper lobe and left lower lobe. These findings may relate to multifocal pneumonia. Alternatively, pulmonary hemorrhage with endobronchial extension/aspiration of hemorrhage could appear similarly. 7. Enlarged bronchial arteries bilaterally. Left bronchial artery is not optimally delineated given the early phase of enhancement. The right bronchial artery, however, is seen arising anteriorly from the aorta at the level of the superior endplate of T6. Aortic Atherosclerosis (ICD10-I70.0). Electronically Signed   By: Fidela Salisbury M.D.   On: 06/15/2022 01:24   DG Chest Port 1 View  Result Date: 06/14/2022 CLINICAL DATA:  Shortness of breath, hemoptysis. EXAM: PORTABLE CHEST 1 VIEW COMPARISON:  CT chest dated August 13, 2020 FINDINGS: The heart size and mediastinal contours are within normal limits. Postsurgical changes about the right hilum consistent with right upper lobectomy. Volume loss of the right lung.  Bilateral reticular opacities with cystic changes consistent with chronic lung disease, unchanged. Acute infectious/inflammatory process in the settings of chronic lung disease can not be excluded. IMPRESSION: Bilateral reticular opacities with cystic changes consistent with chronic lung disease, unchanged. Acute infectious/inflammatory process in the settings of chronic lung disease can not be excluded. Electronically Signed   By: Keane Police D.O.   On: 06/14/2022 22:56    Pending Labs Unresulted Labs (From admission, onward)     Start     Ordered   06/16/22 0500  Comprehensive metabolic panel  Tomorrow morning,   R        06/15/22 0559   06/16/22 0500  CBC with Differential/Platelet  Tomorrow morning,   R        06/15/22 0559   06/15/22 0559  Magnesium  Once,   R        06/15/22 0559   06/15/22 0558  HIV Antibody (routine testing w rflx)  (HIV Antibody (Routine testing w reflex) panel)  Once,   R        06/15/22 0559   06/15/22 0357  Tacrolimus level  Once,   R        06/15/22 0358   06/15/22 0355  Hemoglobin and hematocrit, blood  Now then every 8 hours,   R (with TIMED occurrences)      06/15/22 0355   06/15/22 0225  Expectorated Sputum Assessment w Gram Stain, Rflx to Resp Cult  Once,   R        06/15/22 0224            Vitals/Pain Today's Vitals   06/15/22 1130 06/15/22 1143 06/15/22 1150 06/15/22 1200  BP: 118/66     Pulse: (!) 56   63  Resp: 10   13  Temp:   98 F (36.7 C)   TempSrc:   Oral   SpO2: 99%   98%  Weight:      Height:      PainSc:  0-No pain      Isolation Precautions Airborne and Contact precautions  Medications Medications  piperacillin-tazobactam (ZOSYN) IVPB 3.375 g (0 g Intravenous Stopped 06/15/22 0828)  amLODipine (NORVASC) tablet 10 mg (10 mg Oral Given 06/15/22 0959)  fluticasone (FLONASE) 50 MCG/ACT nasal spray 2 spray (has no administration in time range)  tacrolimus (PROGRAF) capsule 3 mg (3 mg Oral Given 06/15/22 0958)  tacrolimus  (PROGRAF) capsule 2 mg (has no administration in time range)  insulin glargine-yfgn (SEMGLEE) injection 10 Units (  has no administration in time range)  insulin aspart (novoLOG) injection 0-15 Units ( Subcutaneous Not Given 06/15/22 0824)  pantoprazole (PROTONIX) injection 40 mg (40 mg Intravenous Given 06/15/22 0959)  acetaminophen (TYLENOL) tablet 650 mg (has no administration in time range)    Or  acetaminophen (TYLENOL) suppository 650 mg (has no administration in time range)  atorvastatin (LIPITOR) tablet 10 mg (10 mg Oral Given 06/15/22 0959)  0.9 %  sodium chloride infusion ( Intravenous New Bag/Given 06/15/22 1151)  iohexol (OMNIPAQUE) 350 MG/ML injection 75 mL (75 mLs Intravenous Contrast Given 06/15/22 0058)  lactated ringers bolus 1,000 mL (0 mLs Intravenous Stopped 06/15/22 0644)    Mobility walks Low fall risk   Focused Assessments Pulmonary Assessment Handoff:  Lung sounds:   O2 Device: Nasal Cannula O2 Flow Rate (L/min): 2 L/min    R Recommendations: See Admitting Provider Note  Report given to:   Additional Notes:

## 2022-06-15 NOTE — ED Provider Notes (Signed)
[facility] EMERGENCY DEPARTMENT Provider Note   CSN: [id number] Arrival date & time: 06/14/22  2155     History  Chief Complaint  Patient presents with   Hemoptysis    Jeffery York is a 67 y.o. male.  Patient presents to the emergency department for evaluation of shortness of breath and hemoptysis.  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  He had right upper lobe surgery many years ago for this secondary to recurrent episodes of hemoptysis.  He reports that that stopped the episodes of hemoptysis.  Since then he has been diagnosed with PE x 2.  He is maintained on Eliquis.  Patient noted to have a new oxygen demand at time of triage, oxygen saturations were 85% on room air.       Home Medications Prior to Admission medications   Medication Sig Start Date End Date Taking? Authorizing Provider  allopurinol (ZYLOPRIM) 300 MG tablet Take 300 mg by mouth daily.  08/27/17   [provider]  amLODipine (NORVASC) 10 MG tablet Take 1 tablet (10 mg total) by mouth daily. 08/07/17   Colon Branch, MD  apixaban (ELIQUIS) 2.5 MG TABS tablet Take 1 tablet (2.5 mg total) by mouth 2 (two) times daily. 08/30/20   Colon Branch, MD  ascorbic acid (VITAMIN C) 500 MG tablet Take 1,000 mg by mouth daily.     [provider]  atorvastatin (LIPITOR) 10 MG tablet Take 1 tablet (10 mg total) by mouth every other day. 08/30/20   Colon Branch, MD  cetirizine (ZYRTEC) 10 MG tablet Take 10 mg by mouth daily.    [provider]  cholecalciferol (VITAMIN D) 1000 units tablet Take 1,000 Units by mouth daily.     [provider]  Coenzyme Q10 (COQ10) 100 MG CAPS Take 100 mg by mouth daily.    [provider]  fluticasone (FLONASE) 50 MCG/ACT nasal spray Place 2 sprays into both nostrils daily as needed for allergies.  01/27/16   [provider]  hydrochlorothiazide (HYDRODIURIL) 25 MG tablet Take 25 mg by mouth daily.    [provider]  insulin glargine (LANTUS) 100 UNIT/ML injection Inject 17 Units into the skin daily at 2 PM. Currently 21 units (17-20 uts can change)    [provider]  JARDIANCE 25 MG TABS tablet Take 12.5 mg by mouth daily. 10/19/19   [provider]  magnesium oxide (MAG-OX) 400 MG tablet Take 1 tablet by mouth 2 (two) times daily. 06/26/20   [provider]  tacrolimus (PROGRAF) 1 MG capsule Take 2-3 mg by mouth See admin instructions. Take 3 capsules every morning and take 2 capsules every evening    [provider]      Allergies    Benazepril hcl, Amlodipine besy-benazepril hcl, Erythromycin ethylsuccinate, Keflex [cephalexin], and Terazosin    Review of Systems   Review of Systems  Physical Exam Updated Vital Signs BP 130/71   Pulse 82   Temp 97.7 F (36.5 C) (Oral)   Resp 18   Ht '5\' 8"'$  (1.727 m)   Wt 76.6 kg   SpO2 96%   BMI 25.68 kg/m  Physical Exam Vitals and nursing note reviewed.  Constitutional:      General: He is not in acute distress.    Appearance: He is well-developed.  HENT:     Head: Normocephalic and atraumatic.     Mouth/Throat:     Mouth: Mucous membranes are moist.  Eyes:     General: Vision grossly intact. Gaze aligned appropriately.     Extraocular Movements: Extraocular movements intact.     Conjunctiva/sclera: Conjunctivae normal.  Cardiovascular:     Rate and Rhythm: Normal rate and regular rhythm.     Pulses: Normal pulses.     Heart sounds: Normal heart sounds, S1 normal and S2 normal. No murmur heard.    No friction rub. No gallop.  Pulmonary:     Effort: Pulmonary effort is normal. No respiratory distress.     Breath sounds: Normal breath sounds.  Abdominal:     Palpations: Abdomen is soft.     Tenderness: There is no abdominal tenderness. There is no guarding or rebound.     Hernia: No hernia is present.  Musculoskeletal:        General: No swelling.     Cervical back: Full passive range of  motion without pain, normal range of motion and neck supple. No pain with movement, spinous process tenderness or muscular tenderness. Normal range of motion.     Right lower leg: No edema.     Left lower leg: No edema.  Skin:    General: Skin is warm and dry.     Capillary Refill: Capillary refill takes less than 2 seconds.     Findings: No ecchymosis, erythema, lesion or wound.  Neurological:     Mental Status: He is alert and oriented to person, place, and time.     GCS: GCS eye subscore is 4. GCS verbal subscore is 5. GCS motor subscore is 6.     Cranial Nerves: Cranial nerves 2-12 are intact.     Sensory: Sensation is intact.     Motor: Motor function is intact. No weakness or abnormal muscle tone.     Coordination: Coordination is intact.  Psychiatric:        Mood and Affect: Mood normal.        Speech: Speech normal.        Behavior: Behavior normal.     ED Results / Procedures / Treatments   Labs (all labs ordered are listed, but only abnormal results are displayed) Labs Reviewed  COMPREHENSIVE METABOLIC PANEL - Abnormal; Notable for the following components:      Result Value   Glucose, Bld 158 (*)    BUN 37 (*)    Creatinine, Ser 1.36 (*)    Albumin 3.3 (*)    GFR, Estimated 57 (*)    All other components within normal limits  RESP PANEL BY RT-PCR (RSV, FLU A&B, COVID)  [id number]  CBC WITH DIFFERENTIAL/PLATELET  BRAIN NATRIURETIC PEPTIDE  TROPONIN I (HIGH SENSITIVITY)  TROPONIN I (HIGH SENSITIVITY)    EKG None  Radiology CT Angio Chest PE W and/or Wo Contrast  Result Date: 06/15/2022 CLINICAL DATA:  Pulmonary embolism (PE) suspected, high prob hx of PE, hemoptysis EXAM: CT ANGIOGRAPHY CHEST WITH CONTRAST TECHNIQUE: Multidetector CT imaging of the chest was performed using the standard protocol during bolus administration of intravenous contrast. Multiplanar CT image reconstructions and MIPs were obtained to evaluate the vascular anatomy. RADIATION DOSE REDUCTION:  This exam was performed according to the departmental dose-optimization program which includes automated exposure control, adjustment of the mA and/or kV according to patient size and/or use of iterative reconstruction technique. CONTRAST:  46m OMNIPAQUE IOHEXOL 350 MG/ML SOLN COMPARISON:  08/13/2020 FINDINGS: Cardiovascular: There is adequate opacification of the pulmonary arterial tree. No intraluminal filling defect identified to suggest acute pulmonary embolism. The central pulmonary  arteries are mildly enlarged suggesting changes of pulmonary arterial hypertension. Moderate coronary artery calcification within the left anterior descending and ramus intermedius arteries. Cardiac size within normal limits. No pericardial effusion. Mild atherosclerotic calcification within the thoracic aorta. No aortic aneurysm. The bronchial arteries are enlarged. Left bronchial artery is not optimally delineated given the early phase of enhancement. The right bronchial artery, however, is seen arising anteriorly from the aorta at the level of the superior endplate of T6 (image # 104/6). Mediastinum/Nodes: No enlarged mediastinal, hilar, or axillary lymph nodes. Thyroid gland, trachea, and esophagus demonstrate no significant findings. Lungs/Pleura: There is architectural distortion with cylindrical bronchiectasis, honeycombing, volume loss, and superior height hilar retraction within the lung apices bilaterally in keeping with chronic changes of pulmonary sarcoidosis. Milder changes are noted within the superior segment of the lower lobes bilaterally. These findings appear similar in severity to prior examination. The previously noted cavitary lesion within the right upper lobe now demonstrates rounded soft tissue which is suspicious for a developing mycetoma, best seen on axial image # 30/7 and coronal image # 71/8. Additionally, there has developed extensive ground-glass and consolidative infiltrate within the superior  segment of the right lower lobe. Inflammatory appearing ground-glass nodularity is seen within the right upper lobe and left lower lobe. These findings may relate to multifocal pneumonia. Alternatively, pulmonary hemorrhage with endobronchial extension/aspiration of hemorrhage could appear similarly. No pneumothorax or pleural effusion. No central obstructing lesion. Upper Abdomen: No acute abnormality. Musculoskeletal: No acute bone abnormality. No lytic or blastic bone lesion. Review of the MIP images confirms the above findings. IMPRESSION: 1. No pulmonary embolism. 2. Morphologic changes in keeping with pulmonary arterial hypertension. 3. Moderate coronary artery calcification. 4. Chronic changes of pulmonary sarcoidosis, similar in severity to prior examination. 5. Interval development of rounded soft tissue mass within the previously noted cavitary lesion within the right upper lobe suspicious for a developing mycetoma. 6. Interval development of extensive ground-glass and consolidative infiltrate within the superior segment of the right lower lobe. Inflammatory appearing ground-glass nodularity is seen within the right upper lobe and left lower lobe. These findings may relate to multifocal pneumonia. Alternatively, pulmonary hemorrhage with endobronchial extension/aspiration of hemorrhage could appear similarly. 7. Enlarged bronchial arteries bilaterally. Left bronchial artery is not optimally delineated given the early phase of enhancement. The right bronchial artery, however, is seen arising anteriorly from the aorta at the level of the superior endplate of T6. Aortic Atherosclerosis (ICD10-I70.0). Electronically Signed   By: Fidela Salisbury M.D.   On: 06/15/2022 01:24   DG Chest Port 1 View  Result Date: 06/14/2022 CLINICAL DATA:  Shortness of breath, hemoptysis. EXAM: PORTABLE CHEST 1 VIEW COMPARISON:  CT chest dated August 13, 2020 FINDINGS: The heart size and mediastinal contours are within normal  limits. Postsurgical changes about the right hilum consistent with right upper lobectomy. Volume loss of the right lung. Bilateral reticular opacities with cystic changes consistent with chronic lung disease, unchanged. Acute infectious/inflammatory process in the settings of chronic lung disease can not be excluded. IMPRESSION: Bilateral reticular opacities with cystic changes consistent with chronic lung disease, unchanged. Acute infectious/inflammatory process in the settings of chronic lung disease can not be excluded. Electronically Signed   By: Keane Police D.O.   On: 06/14/2022 22:56    Procedures Procedures    Medications Ordered in ED Medications  iohexol (OMNIPAQUE) 350 MG/ML injection 75 mL (75 mLs Intravenous Contrast Given 06/15/22 0058)    ED Course/ Medical Decision Making/ A&P  Medical Decision Making  Patient with hemoptysis.  He reports that over the last several nights every time he lies down he feels short of breath, begins to cough and the cough is productive of a fairly large amount of blood.  He is hypoxic on room air here, improved with supplemental oxygen by nasal cannula.  He does not appear to be in any distress currently.  Patient recently diagnosed with COVID.  Chest x-ray unremarkable.  He is at risk for recurrent PE.  CT PE study performed, no evidence of PE.  Extensive groundglass opacities that are likely hemorrhage.  CT studies discussed briefly with critical care.  Recommend hospitalist admission and pulmonology will consult.  There is no pulmonologist available until after 7 AM.  Supportive care including oxygen therapy, hold Eliquis.  Will admit to hospitalist service.        Final Clinical Impression(s) / ED Diagnoses Final diagnoses:  Hemoptysis    Rx / DC Orders ED Discharge Orders     None         Nilani Hugill, Gwenyth Allegra, MD 06/15/22 0222

## 2022-06-15 NOTE — H&P (Addendum)
PCP:   Percell Belt, DO   Chief Complaint:  Hemoptysis  HPI: This is a pleasant 67 year old gentleman with past medical history of sarcoidosis, liver cirrhosis, pulmonary embolism, diabetes mellitus, hepatitis C, hepatocellular carcinoma, hypertension, dyslipidemia, history of liver transplant, BPH.  Tonight patient presents with complaints of upcoming coughing up bright red blood.  Per patient tonight he has been laying in bed, he feels a blood in his lungs.  He states to go to the restroom, bent over the toilet and blood would run out.  The patient is maintained on Eliquis for prior PE in 2021.  His pulmonologist have left on the half dose of Eliquis this, citing he has a high possibility of recurrent PE.  The patient was recently diagnosed with COVID January 1.  He has had a cough during that time.  His cough has had blood-tinged sputum but nothing that marked.  Yesterday became worse and he believes he coughed up approximately 2 ounces of bright red blood.  Tonight was worse he believes he coughed up approximately 6 ounces.  Patient has a significant history of sarcoidosis.  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  In 2016 he had a liver transplant secondary to hep C/hepatocellular carcinoma and liver cirrhosis.  Per patient he has been doing well since then.  Review of Systems:  The patient denies anorexia, fever, weight loss,, vision loss, decreased hearing, hoarseness, chest pain, syncope, dyspnea on exertion, peripheral edema, balance deficits, hemoptysis, abdominal pain, melena, hematochezia, severe indigestion/heartburn, hematuria, incontinence, genital sores, muscle weakness, suspicious skin lesions, transient blindness, difficulty walking, depression, unusual weight change, abnormal bleeding, enlarged lymph nodes, angioedema, and breast masses. Positives: Cough, hemoptysis  Past Medical History: Past Medical History:  Diagnosis Date    Benign prostatic hypertrophy    Cirrhosis ([place])    Diabetes mellitus    initially 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 but then the diabetes resurface without the use of steroids   HCC (hepatocellular carcinoma) ([place])    s/p TACE and microwave ablation   Hep C w/o coma, chronic ([place])    bx aprox 12-10, s/p Harvoni, on a transplant list candidate    History of sarcoidosis    used steroids x 10 years   Hyperlipidemia    Hypertension    Liver transplant recipient ([place]) 2016   OSA (obstructive sleep apnea) 01/15/2018   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 Authorizing Provider  allopurinol (ZYLOPRIM) 300 MG tablet Take 300 mg by mouth daily.  08/27/17   [provider]  amLODipine (NORVASC) 10 MG tablet Take 1 tablet (10 mg total) by mouth daily. 08/07/17   Colon Branch, MD  apixaban (ELIQUIS) 2.5 MG TABS tablet Take 1 tablet (2.5 mg total) by mouth 2 (two) times daily. 08/30/20   Colon Branch, MD  ascorbic acid (VITAMIN C) 500 MG tablet Take 1,000 mg by mouth daily.     [provider]  atorvastatin (LIPITOR) 10 MG tablet Take 1 tablet (10 mg total) by mouth every other day. 08/30/20   Colon Branch, MD  cetirizine (ZYRTEC) 10 MG tablet Take 10 mg by mouth daily.    [provider]  cholecalciferol (VITAMIN D)  1000 units tablet Take 1,000 Units by mouth daily.     [provider]  Coenzyme Q10 (COQ10) 100 MG CAPS Take 100 mg by mouth daily.    [provider]  fluticasone (FLONASE) 50 MCG/ACT nasal spray Place 2 sprays into both nostrils daily as needed for allergies.  01/27/16   [provider]  hydrochlorothiazide (HYDRODIURIL) 25 MG tablet Take 25 mg by mouth daily.    [provider]  insulin glargine (LANTUS) 100 UNIT/ML  injection Inject 17 Units into the skin daily at 2 PM. Currently 21 units (17-20 uts can change)    [provider]  JARDIANCE 25 MG TABS tablet Take 12.5 mg by mouth daily. 10/19/19   [provider]  magnesium oxide (MAG-OX) 400 MG tablet Take 1 tablet by mouth 2 (two) times daily. 06/26/20   [provider]  tacrolimus (PROGRAF) 1 MG capsule Take 2-3 mg by mouth See admin instructions. Take 3 capsules every morning and take 2 capsules every evening    [provider]    Allergies:   Allergies  Allergen Reactions   Benazepril Hcl Swelling    angioedema   Amlodipine Besy-Benazepril Hcl Other (See Comments)   Erythromycin Ethylsuccinate     REACTION: GI Intolerance/vomiting   Keflex [Cephalexin] Diarrhea    With cramps and Nausea   Terazosin Swelling and Other (See Comments)    Other reaction(s): Hypotension    Social History:  reports that he quit smoking about 34 years ago. His smoking use included cigarettes. He has a 10.00 pack-year smoking history. He has never used smokeless tobacco. He reports that he does not drink alcohol and does not use drugs.  Family History: Family History  Problem Relation Age of Onset   Prostate cancer Brother        at age 41s   Pancreatic cancer Brother    CAD Brother    Lung cancer Father    Diabetes Other        father side    Colon cancer Neg Hx     Physical Exam: Vitals:   06/14/22 2202 06/14/22 2211 06/14/22 2213 06/15/22 0057  BP: 133/76   130/71  Pulse: 87   82  Resp: 16   18  Temp: 97.8 F (36.6 C)   97.7 F (36.5 C)  TempSrc:    Oral  SpO2: (!) 85%  97% 96%  Weight:  76.6 kg    Height:  '5\' 8"'$  (1.727 m)      General:  Alert and oriented times three, well developed and nourished, no acute distress Eyes: PERRLA, pink conjunctiva, no scleral icterus ENT: Moist oral mucosa, neck supple, no thyromegaly Lungs: clear to ascultation, no wheeze, no crackles, no use of accessory  muscles Cardiovascular: regular rate and rhythm, no regurgitation, no gallops, no murmurs. No carotid bruits, no JVD Abdomen: soft, positive BS, non-tender, non-distended, no organomegaly, not an acute abdomen GU: not examined Neuro: CN II - XII grossly intact, sensation intact Musculoskeletal: strength 5/5 all extremities, no clubbing, cyanosis or edema Skin: no rash, no subcutaneous crepitation, no decubitus Psych: appropriate patient  Perfect sounding lung  Labs on Admission:  Recent Labs    06/14/22 2222  NA 138  K 4.2  CL 102  CO2 28  GLUCOSE 158*  BUN 37*  CREATININE 1.36*  CALCIUM 9.1   Recent Labs    06/14/22 2222  AST 25  ALT 24  ALKPHOS 70  BILITOT 0.6  PROT 7.9  ALBUMIN 3.3*    Recent Labs    06/14/22 2222  WBC 7.5  NEUTROABS 3.7  HGB 14.9  HCT 46.7  MCV 91.4  PLT 273    Micro Results: Recent Results (from the past 240 hour(s))  Resp panel by RT-PCR (RSV, Flu A&B, Covid) Anterior Nasal Swab     Status: None   Collection Time: 06/14/22 10:16 PM   Specimen: Anterior Nasal Swab  Result Value Ref Range Status   SARS Coronavirus 2 by RT PCR NEGATIVE NEGATIVE Final    Comment: (NOTE) SARS-CoV-2 target nucleic acids are NOT DETECTED.  The SARS-CoV-2 RNA is generally detectable in upper respiratory specimens during the acute phase of infection. The lowest concentration of SARS-CoV-2 viral copies this assay can detect is 138 copies/mL. A negative result does not preclude SARS-Cov-2 infection and should not be used as the sole basis for treatment or other patient management decisions. A negative result may occur with  improper specimen collection/handling, submission of specimen other than nasopharyngeal swab, presence of viral mutation(s) within the areas targeted by this assay, and inadequate number of viral copies(<138 copies/mL). A negative result must be combined with clinical observations, patient history, and epidemiological information. The  expected result is Negative.  Fact Sheet for Patients:  EntrepreneurPulse.com.au  Fact Sheet for Healthcare Providers:  IncredibleEmployment.be  This test is no t yet approved or cleared by the [place] FDA and  has been authorized for detection and/or diagnosis of SARS-CoV-2 by FDA under an Emergency Use Authorization (EUA). This EUA will remain  in effect (meaning this test can be used) for the duration of the COVID-19 declaration under Section 564(b)(1) of the Act, 21 U.S.C.section 360bbb-3(b)(1), unless the authorization is terminated  or revoked sooner.       Influenza A by PCR NEGATIVE NEGATIVE Final   Influenza B by PCR NEGATIVE NEGATIVE Final    Comment: (NOTE) The Xpert Xpress SARS-CoV-2/FLU/RSV plus assay is intended as an aid in the diagnosis of influenza from Nasopharyngeal swab specimens and should not be used as a sole basis for treatment. Nasal washings and aspirates are unacceptable for Xpert Xpress SARS-CoV-2/FLU/RSV testing.  Fact Sheet for Patients: EntrepreneurPulse.com.au  Fact Sheet for Healthcare Providers: IncredibleEmployment.be  This test is not yet approved or cleared by the [place] FDA and has been authorized for detection and/or diagnosis of SARS-CoV-2 by FDA under an Emergency Use Authorization (EUA). This EUA will remain in effect (meaning this test can be used) for the duration of the COVID-19 declaration under Section 564(b)(1) of the Act, 21 U.S.C. section 360bbb-3(b)(1), unless the authorization is terminated or revoked.     Resp Syncytial Virus by PCR NEGATIVE NEGATIVE Final    Comment: (NOTE) Fact Sheet for Patients: EntrepreneurPulse.com.au  Fact Sheet for Healthcare Providers: IncredibleEmployment.be  This test is not yet approved or cleared by the [place] FDA and has been authorized for detection and/or  diagnosis of SARS-CoV-2 by FDA under an Emergency Use Authorization (EUA). This EUA will remain in effect (meaning this test can be used) for the duration of the COVID-19 declaration under Section 564(b)(1) of the Act, 21 U.S.C. section 360bbb-3(b)(1), unless the authorization is terminated or revoked.  Performed at [facility], [place] [address], [place], [place] [address]      Radiological Exams on Admission: CT Angio Chest PE W and/or Wo Contrast  Result Date: 06/15/2022 CLINICAL DATA:  Pulmonary embolism (PE) suspected, high prob hx of PE, hemoptysis EXAM: CT ANGIOGRAPHY CHEST WITH  CONTRAST TECHNIQUE: Multidetector CT imaging of the chest was performed using the standard protocol during bolus administration of intravenous contrast. Multiplanar CT image reconstructions and MIPs were obtained to evaluate the vascular anatomy. RADIATION DOSE REDUCTION: This exam was performed according to the departmental dose-optimization program which includes automated exposure control, adjustment of the mA and/or kV according to patient size and/or use of iterative reconstruction technique. CONTRAST:  67m OMNIPAQUE IOHEXOL 350 MG/ML SOLN COMPARISON:  08/13/2020 FINDINGS: Cardiovascular: There is adequate opacification of the pulmonary arterial tree. No intraluminal filling defect identified to suggest acute pulmonary embolism. The central pulmonary arteries are mildly enlarged suggesting changes of pulmonary arterial hypertension. Moderate coronary artery calcification within the left anterior descending and ramus intermedius arteries. Cardiac size within normal limits. No pericardial effusion. Mild atherosclerotic calcification within the thoracic aorta. No aortic aneurysm. The bronchial arteries are enlarged. Left bronchial artery is not optimally delineated given the early phase of enhancement. The right bronchial artery, however, is seen arising anteriorly from the aorta at the level of the superior  endplate of T6 (image # 104/6). Mediastinum/Nodes: No enlarged mediastinal, hilar, or axillary lymph nodes. Thyroid gland, trachea, and esophagus demonstrate no significant findings. Lungs/Pleura: There is architectural distortion with cylindrical bronchiectasis, honeycombing, volume loss, and superior height hilar retraction within the lung apices bilaterally in keeping with chronic changes of pulmonary sarcoidosis. Milder changes are noted within the superior segment of the lower lobes bilaterally. These findings appear similar in severity to prior examination. The previously noted cavitary lesion within the right upper lobe now demonstrates rounded soft tissue which is suspicious for a developing mycetoma, best seen on axial image # 30/7 and coronal image # 71/8. Additionally, there has developed extensive ground-glass and consolidative infiltrate within the superior segment of the right lower lobe. Inflammatory appearing ground-glass nodularity is seen within the right upper lobe and left lower lobe. These findings may relate to multifocal pneumonia. Alternatively, pulmonary hemorrhage with endobronchial extension/aspiration of hemorrhage could appear similarly. No pneumothorax or pleural effusion. No central obstructing lesion. Upper Abdomen: No acute abnormality. Musculoskeletal: No acute bone abnormality. No lytic or blastic bone lesion. Review of the MIP images confirms the above findings. IMPRESSION: 1. No pulmonary embolism. 2. Morphologic changes in keeping with pulmonary arterial hypertension. 3. Moderate coronary artery calcification. 4. Chronic changes of pulmonary sarcoidosis, similar in severity to prior examination. 5. Interval development of rounded soft tissue mass within the previously noted cavitary lesion within the right upper lobe suspicious for a developing mycetoma. 6. Interval development of extensive ground-glass and consolidative infiltrate within the superior segment of the right lower  lobe. Inflammatory appearing ground-glass nodularity is seen within the right upper lobe and left lower lobe. These findings may relate to multifocal pneumonia. Alternatively, pulmonary hemorrhage with endobronchial extension/aspiration of hemorrhage could appear similarly. 7. Enlarged bronchial arteries bilaterally. Left bronchial artery is not optimally delineated given the early phase of enhancement. The right bronchial artery, however, is seen arising anteriorly from the aorta at the level of the superior endplate of T6. Aortic Atherosclerosis (ICD10-I70.0). Electronically Signed   By: AFidela SalisburyM.D.   On: 06/15/2022 01:24   DG Chest Port 1 View  Result Date: 06/14/2022 CLINICAL DATA:  Shortness of breath, hemoptysis. EXAM: PORTABLE CHEST 1 VIEW COMPARISON:  CT chest dated August 13, 2020 FINDINGS: The heart size and mediastinal contours are within normal limits. Postsurgical changes about the right hilum consistent with right upper lobectomy. Volume loss of the right lung. Bilateral reticular opacities with  cystic changes consistent with chronic lung disease, unchanged. Acute infectious/inflammatory process in the settings of chronic lung disease can not be excluded. IMPRESSION: Bilateral reticular opacities with cystic changes consistent with chronic lung disease, unchanged. Acute infectious/inflammatory process in the settings of chronic lung disease can not be excluded. Electronically Signed   By: Keane Police D.O.   On: 06/14/2022 22:56    Assessment/Plan Present on Admission:  Hemoptysis/B/L reticular opacities with cystic changes -Admit to MedSurg -Hold Eliquis -N.p.o., pulmonary consulted by CCM. -SCDs -Serial H&H -IV Protonix  Recent COVID/immunocompromise patient due to tacrolimus -Repeat COVID swab ordered -Patient likely with superinfection as well as changes related to aspiration of blood.  Given patient's immunocompromise status, we will go ahead and start treating empirically  with IV Zosyn   Sarcoidosis -Monitoring clinically   Hyperlipidemia -Lipitor on hold   Essential hypertension -Stable, Norvasc resumed   Gout -Allopurinol not ordered   OSA (obstructive sleep apnea) -CPAP ordered  History of liver transplant, immunocompromised patient secondary to medication -Resume tacrolimus, levels ordered.   Hepatitis C ---cirrhosis --- s/p HARVONI-- hapatocellular cancer -Treated and resolved  Diron Haddon 06/15/2022, 2:20 AM

## 2022-06-15 NOTE — Progress Notes (Signed)
Pt refused to wear CPAP tonight. ?

## 2022-06-15 NOTE — Progress Notes (Signed)
PROGRESS NOTE  Jeffery York  DOB: 1956-05-30  PCP: Jeffery Belt, DO LYY:[id number]  DOA: 06/14/2022  LOS: 0 days  Hospital Day: 2  Brief narrative: Jeffery York is a 67 y.o. 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  He recently had COVID-19 10 days ago and had blood-tinged sputum intermittently.  On the day of presentation, he had a large amount of bright red blood in his sputum and hence came to the ED. Patient has a history of pulmonary embolism in 2021. His pulmonologist have left on the half dose of Eliquis, citing he has a high possibility of recurrent PE.   In the ED, patient was afebrile, hemodynamically stable, O2 sat was low at 85% on room air Labs with unremarkable CBC, CMP with BUN/creatinine 37/1.36, troponin normal Respiratory virus panel unremarkable for flu, COVID, RSV CT angio chest did not show evidence of pulm embolism.  However it showed chronic changes secondary to pulmonary sarcoidosis, pulmonary artery hypertension. It also showed an interval development of rounded soft tissue mass within the previously noted cavitary lesion within the right upper lobe suspicious for a developing mycetoma. Interval development of extensive ground-glass and consolidative infiltrate within the superior segment of the right lower lobe. Inflammatory appearing ground-glass nodularity within the right upper lobe and left lower lobe. These findings may relate to multifocal pneumonia. Alternatively, pulmonary hemorrhage with endobronchial extension/aspiration of hemorrhage could appear similarly.  Admitted to [facility]  Subjective: Patient was seen and examined this morning.  Pleasant elderly African-American male.  Lying on bed.  Not in distress pulmonary symptoms. Chart reviewed Remains afebrile,  hemodynamically stable Repeat labs this morning showed improvement in creatinine.  Assessment and plan: Abnormal CT scan findings  Presented with intermittent hematemesis culminating on large amount of bright red blood in cough on 1/10 in the setting of chronic sarcoidosis, history of aspergilloma and recent diagnosis of COVID-19 CT chest finding as above raising suspicions of mycetoma, multifocal pneumonia as well as pulmonary hemorrhage Pulmonary consult pending Currently on empiric IV Zosyn Currently n.p.o. for possible bronch Hemoglobin remains normal and stable. Recent Labs    06/14/22 2222 06/15/22 0437  HGB 14.9 15.1  MCV 91.4 88.7   AKI Creatinine improved with hydration overnight Recent Labs    06/14/22 2222 06/15/22 0437  BUN 37* 42*  CREATININE 1.36* 1.11   S/p liver transplant 2016  -for hep C, liver cirrhosis, HCC Normal LFTs Continue tacrolimus twice daily   Recent COVID Evidently diagnosed with COVID-19 on January 1 and completed a course of molnupiravir Respiratory virus panel negative on this admission  History of PE Eliquis 2.5 mg twice daily.  Currently on hold  Chronic pulmonary sarcoidosis Changes noticed on CT chest.  Not on chronic long-term treatment for sarcoidosis.  Type 2 diabetes mellitus Hypoglycemia this morning A1c 6.6 on 06/15/2022 PTA on Lantus 22 units daily, Jardiance 12.5 mg daily Currently on Semglee 10 units daily, sliding scale insulin with Accu-Cheks Blood sugar level was low this morning probably because of n.p.o. status.  Diet has been started subsequently Recent Labs  Lab 06/15/22 1148  GLUCAP 68*   Essential hypertension PTA on HCTZ 25 mg daily, amlodipine 10 mg daily Currently HCTZ on hold.  Continue amlodipine Continue to monitor blood pressure  Hyperlipidemia Aortic atherosclerosis PTA on Eliquis 2.5-minute daily,  Lipitor 10 mg every other day Eliquis on hold.  Continue Lipitor  OSA Nightly  CPAP  Gout Allopurinol    Mobility: Encourage ambulation  Goals of care   Code Status: Full Code    Scheduled Meds:  amLODipine  10 mg Oral Daily   atorvastatin  10 mg Oral QODAY   insulin aspart  0-15 Units Subcutaneous TID WC   insulin glargine-yfgn  10 Units Subcutaneous Daily   pantoprazole (PROTONIX) IV  40 mg Intravenous Daily   tacrolimus  2 mg Oral QHS   tacrolimus  3 mg Oral Daily    PRN meds: acetaminophen **OR** acetaminophen, fluticasone   Infusions:   sodium chloride 75 mL/hr at 06/15/22 1151   piperacillin-tazobactam (ZOSYN)  IV Stopped (06/15/22 0272)    Skin assessment:     Nutritional status:  Body mass index is 25.68 kg/m.          Diet:  Diet Order             Diet regular Room service appropriate? Yes; Fluid consistency: Thin  Diet effective now                   DVT prophylaxis:  SCDs Start: 06/15/22 0559 Place and maintain sequential compression device Start: 06/15/22 0558   Antimicrobials: IV Zosyn Fluid: NS at 39 mill per hour Consultants: PCCM Family Communication: None at bedside  Status is: Inpatient  Continue in-hospital care because: Pending pulmonary evaluation.  May need bronchoscopy Level of care: Telemetry Medical   Dispo: The patient is from: Home              Anticipated d/c is to: Pending clinical course              Patient currently is not medically stable to d/c.   Difficult to place patient No    Antimicrobials: Anti-infectives (From admission, onward)    Start     Dose/Rate Route Frequency Ordered Stop   06/15/22 0400  piperacillin-tazobactam (ZOSYN) IVPB 3.375 g        3.375 g 12.5 mL/hr over 240 Minutes Intravenous Every 8 hours 06/15/22 0325         Objective: Vitals:   06/15/22 1150 06/15/22 1200  BP:    Pulse:  63  Resp:  13  Temp: 98 F (36.7 C)   SpO2:  98%    Intake/Output Summary (Last 24 hours) at 06/15/2022 1259 Last data filed at 06/15/2022 1154 Gross per 24 hour   Intake 1544.48 ml  Output --  Net 1544.48 ml   Filed Weights   06/14/22 2211  Weight: 76.6 kg   Weight change:  Body mass index is 25.68 kg/m.   Physical Exam: General exam: Pleasant, elderly African-American male.  Not in distress Skin: No rashes, lesions or ulcers. HEENT: Atraumatic, normocephalic, no obvious bleeding Lungs: Diminished air entry in both bases. CVS: Regular rate and rhythm, no murmur GI/Abd soft, nontender, nondistended, bowel sound present CNS: Alert, awake, oriented x 3 Psychiatry: Mood appropriate Extremities: No pedal edema, no calf tenderness  Data Review: I have personally reviewed the laboratory data and studies available.  F/u labs ordered Unresulted Labs (From admission, onward)     Start     Ordered   06/16/22 0500  Comprehensive metabolic panel  Tomorrow morning,   R        06/15/22 0559   06/16/22 0500  CBC with Differential/Platelet  Tomorrow morning,   R  06/15/22 0559   06/15/22 0559  Magnesium  Once,   R        06/15/22 0559   06/15/22 0558  HIV Antibody (routine testing w rflx)  (HIV Antibody (Routine testing w reflex) panel)  Once,   R        06/15/22 0559   06/15/22 0357  Tacrolimus level  Once,   R        06/15/22 0358   06/15/22 0355  Hemoglobin and hematocrit, blood  Now then every 8 hours,   R      06/15/22 0355   06/15/22 0225  Expectorated Sputum Assessment w Gram Stain, Rflx to Resp Cult  Once,   R        06/15/22 0224            Total time spent in review of labs and imaging, patient evaluation, formulation of plan, documentation and communication with family -46 minutes  Signed, Terrilee Croak, MD [facility] 06/15/2022

## 2022-06-15 NOTE — Progress Notes (Deleted)
Jeffery York, male    DOB: 05/17/1956   MRN: [id number]   Brief patient profile:  ***  *** referred to pulmonary clinic 06/15/2022 by *** for ***        History of Present Illness  06/15/2022  Pulmonary/ 1st office eval/Scotti Kosta  No chief complaint on file.    Dyspnea:  *** Cough: *** Sleep: *** SABA use:   Past Medical History:  Diagnosis Date   Benign prostatic hypertrophy    Cirrhosis ([place])    Diabetes mellitus    initially 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 but then the diabetes resurface without the use of steroids   HCC (hepatocellular carcinoma) ([place])    s/p TACE and microwave ablation   Hep C w/o coma, chronic ([place])    bx aprox 12-10, s/p Harvoni, on a transplant list candidate    History of sarcoidosis    used steroids x 10 years   Hyperlipidemia    Hypertension    Liver transplant recipient [facility]) 2016   OSA (obstructive sleep apnea) 01/15/2018   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   Uveitis    h/o    Facility-Administered Medications Prior to Visit  Medication Dose Route Frequency Provider Last Rate Last Admin   0.9 % NaCl with KCl 20 mEq/ L  infusion   Intravenous Continuous Crosley, Debby, MD       acetaminophen (TYLENOL) tablet 650 mg  650 mg Oral Q6H PRN Crosley, Debby, MD       Or   acetaminophen (TYLENOL) suppository 650 mg  650 mg Rectal Q6H PRN Crosley, Debby, MD       amLODipine (NORVASC) tablet 10 mg  10 mg Oral Daily Crosley, Debby, MD       fluticasone (FLONASE) 50 MCG/ACT nasal spray 2 spray  2 spray Each Nare Daily PRN Crosley, Debby, MD       insulin aspart (novoLOG) injection 0-15 Units  0-15 Units Subcutaneous TID WC Crosley, Debby, MD       insulin glargine-yfgn (SEMGLEE) injection 10 Units  10 Units Subcutaneous Daily Crosley, Debby, MD       pantoprazole (PROTONIX) injection 40 mg  40 mg Intravenous Daily Crosley, Debby, MD       piperacillin-tazobactam (ZOSYN) IVPB 3.375 g  3.375 g Intravenous Q8H Pollina, Gwenyth Allegra, MD 12.5 mL/hr at 06/15/22 0428  3.375 g at 06/15/22 0428   tacrolimus (PROGRAF) capsule 2 mg  2 mg Oral QHS Crosley, Debby, MD       tacrolimus (PROGRAF) capsule 3 mg  3 mg Oral Daily Crosley, Debby, MD       Outpatient Medications Prior to Visit  Medication Sig Dispense Refill   allopurinol (ZYLOPRIM) 300 MG tablet Take 300 mg by mouth daily.      amLODipine (NORVASC) 10 MG tablet Take 1 tablet (10 mg total) by mouth daily. 90 tablet 1   apixaban (ELIQUIS) 2.5 MG TABS tablet Take 5 mg by mouth 2 (two) times daily.     ascorbic acid (VITAMIN C) 500 MG tablet Take 500 mg by mouth daily.     atorvastatin (LIPITOR) 10 MG tablet Take 1 tablet (10 mg total) by mouth every other day.     cetirizine (ZYRTEC) 10 MG tablet Take 10 mg by mouth daily.     cholecalciferol (VITAMIN D) 1000 units tablet Take 1,000 Units by mouth daily.      Coenzyme Q10 (COQ10) 100 MG CAPS Take 100 mg by mouth daily.  fluticasone (FLONASE) 50 MCG/ACT nasal spray Place 2 sprays into both nostrils daily as needed for allergies.      hydrochlorothiazide (HYDRODIURIL) 25 MG tablet Take 25 mg by mouth daily.     insulin glargine (LANTUS) 100 UNIT/ML injection Inject 22 Units into the skin daily at 2 PM.     JARDIANCE 25 MG TABS tablet Take 12.5 mg by mouth daily.     magnesium oxide (MAG-OX) 400 MG tablet Take 1 tablet by mouth 2 (two) times daily.     tacrolimus (PROGRAF) 1 MG capsule Take 2-3 mg by mouth See admin instructions. Take 3 capsules every morning and take 2 capsules every evening       Objective:     There were no vitals taken for this visit.         Assessment   No problem-specific Assessment & Plan notes found for this encounter.     Christinia Gully, MD 06/15/2022

## 2022-06-15 NOTE — ED Notes (Signed)
Ambulatory to bathroom. Gait steady. No complaints. No signs of distress.

## 2022-06-15 NOTE — Consult Note (Signed)
NAME:  Jeffery York, MRN:  [id number], DOB:  [birth year], LOS: 0 ADMISSION DATE:  06/14/2022, CONSULTATION DATE: 06/15/2022 REFERRING MD: Triad, CHIEF COMPLAINT: Hemoptysis  History of Present Illness:  67 year old male with known sarcoidosis who had a right upper lobectomy 2020 with multiple episodes of hemoptysis requiring fiberoptic bronchoscopy but no bleeder was ever identified.  He had a well-documented past medical history below and he presents with another episode of hemoptysis approximately 24 hours 5 episodes of 6 ounces of bright red blood.  He has not had hemoptysis since 06/14/2018 4 in the evening with his last episode.  Currently he is hemodynamically stable no acute distress.  He had a history of PE for which she was on Eliquis which has now been stopped.  We will continue to observe does not appear to need fiberoptic bronchoscopy at this time. Pertinent  Medical History   Past Medical History:  Diagnosis Date   Benign prostatic hypertrophy    Cirrhosis ([place])    Diabetes mellitus    initially 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 but then the diabetes resurface without the use of steroids   HCC (hepatocellular carcinoma) ([place])    s/p TACE and microwave ablation   Hep C w/o coma, chronic ([place])    bx aprox 12-10, s/p Harvoni, on a transplant list candidate    History of sarcoidosis    used steroids x 10 years   Hyperlipidemia    Hypertension    Liver transplant recipient ([place]) 2016   OSA (obstructive sleep apnea) 01/15/2018   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 rate 13, height '5\' 8"'$  (1.727 m), weight 76.6 kg, SpO2 99 %.        Intake/Output Summary (Last 24 hours) at 06/15/2022 0958 Last data filed  at 06/15/2022 0644 Gross per 24 hour  Intake 1000 ml  Output --  Net 1000 ml   Filed Weights   06/14/22 2211  Weight: 76.6 kg    Examination: General: Well-nourished well-developed male no acute distress HENT: Oropharynx is unremarkable no blood in visualized Lungs: Decreased breath sounds bilaterally Cardiovascular: Heart sounds are regular regular rate rhythm and Abdomen: Abdomen soft nontender Extremities: Extremities without edema Neuro: Grossly intact without focal defect GU: [facility] Problem list     Assessment & Plan:  Hemoptysis approximately 24 hours in duration with approximately 6 ounces of bright red blood with approximately 5 episodes.  This is his fifth episode of hemoptysis.  He said multiple fiberoptic bronchoscopy.  He has known sarcoidosis cavitary lesions in the right upper lobe status post lobectomy approximately 20 years ago.  He is followed by Dr. Chesley Mires of the pulmonary division.  He is on Eliquis for history of PE x 2 Discontinue Eliquis Monitor for at least 24 hours for further hemoptysis Question if he should be placed back on Eliquis in the future this will be discussed with the pulmonary specialist.\ Check hemoglobin hematocrit for completeness his hemoglobin on admission was 15.1 Question if there would be a need for fiberoptic bronchoscopy only if he was continued to have copious bleeding we will try to ablate the bleeding site.  History of sarcoid Continue home medication  Diabetes mellitus CBG (last 3)  No results for input(s): "GLUCAP" in the last 72 hours. Monitor glucose per primary  History of cirrhosis/liver transplant on 04/07/2015 Per primary  History of PE for which he is on Eliquis DC Eliquis  Best Practice (right click and "Reselect all SmartList Selections" daily)   Diet/type: Regular consistency (see orders) DVT prophylaxis: not indicated GI prophylaxis: PPI Lines: N/A Foley:  N/A Code Status:  full  code Last date of multidisciplinary goals of care discussion [tbd] 06/15/2022 patient updated at bedside extensively. Labs   CBC: Recent Labs  Lab 06/14/22 2222 06/15/22 0437  WBC 7.5 8.9  NEUTROABS 3.7 6.0  HGB 14.9 15.1  HCT 46.7 43.0  MCV 91.4 88.7  PLT 273 938    Basic Metabolic Panel: Recent Labs  Lab 06/14/22 2222 06/15/22 0437  NA 138 137  K 4.2 4.0  CL 102 102  CO2 28 28  GLUCOSE 158* 122*  BUN 37* 42*  CREATININE 1.36* 1.11  CALCIUM 9.1 8.9   GFR: Estimated Creatinine Clearance: 61.6 mL/min (by C-G formula based on SCr of 1.11 mg/dL). Recent Labs  Lab 06/14/22 2222 06/15/22 0437  WBC 7.5 8.9    Liver Function Tests: Recent Labs  Lab 06/14/22 2222 06/15/22 0437  AST 25 20  ALT 24 22  ALKPHOS 70 69  BILITOT 0.6 0.7  PROT 7.9 7.6  ALBUMIN 3.3* 3.4*   No results for input(s): "LIPASE", "AMYLASE" in the last 168 hours. Recent Labs  Lab 06/15/22 0437  AMMONIA 36*    ABG    Component Value Date/Time   TCO2 29 10/24/2007 1120     Coagulation Profile: No results for input(s): "INR", "PROTIME" in the last 168 hours.  Cardiac Enzymes: No results for input(s): "CKTOTAL", "CKMB", "CKMBINDEX", "TROPONINI" in the last 168 hours.  HbA1C: Hemoglobin A1C  Date/Time Value Ref Range Status  07/08/2021 12:00 AM 6.8  Final  01/14/2021 12:00 AM 6.6  Final   Hgb A1c MFr Bld  Date/Time Value Ref Range Status  06/15/2022 04:37 AM 6.6 (H) 4.8 - 5.6 % Final    Comment:    (NOTE) Pre diabetes:          5.7%-6.4%  Diabetes:              >6.4%  Glycemic control for   <7.0% adults with diabetes     CBG: No results for input(s): "GLUCAP" in the last 168 hours.  Review of Systems:   10 point review of system taken, please see HPI for positives and negatives. Positive for 24 hours of hemoptysis of approximately 5-6 episodes of approximately 6 ounces of bright red blood.  No further episodes since 06/14/2022 in the evening hours.   Past Medical  History:  He,  has a past medical history of Benign prostatic hypertrophy, Cirrhosis ([place]), Diabetes mellitus, [place] (hepatocellular carcinoma) ([place]), Hep C w/o coma, chronic ([place]), History of sarcoidosis, Hyperlipidemia, Hypertension, Liver transplant recipient ([place]) (2016), OSA (obstructive sleep apnea) (01/15/2018), Osteoporosis, and Uveitis.   Surgical History:   Past Surgical History:  Procedure Laterality Date   LIVER TRANSPLANT  04-2015   lung resection aspergilloma  2000     Social History:   reports that he quit smoking about 34 years ago. His smoking use included cigarettes. He has a 10.00 pack-year smoking history. He has never used smokeless tobacco. He reports that he does not drink alcohol and does not use drugs.   Family History:  His family history includes  CAD in his brother; Diabetes in an other family member; Lung cancer in his father; Pancreatic cancer in his brother; Prostate cancer in his brother. There is no history of Colon cancer.   Allergies Allergies  Allergen Reactions   Benazepril Hcl Swelling    angioedema   Amlodipine Besy-Benazepril Hcl Other (See Comments)   Erythromycin Ethylsuccinate     REACTION: GI Intolerance/vomiting   Keflex [Cephalexin] Diarrhea    With cramps and Nausea   Terazosin Swelling and Other (See Comments)    Other reaction(s): Hypotension     Home Medications  Prior to Admission medications   Medication Sig Start Date End Date Taking? Authorizing Provider  allopurinol (ZYLOPRIM) 300 MG tablet Take 300 mg by mouth daily.  08/27/17  Yes [provider]  amLODipine (NORVASC) 10 MG tablet Take 1 tablet (10 mg total) by mouth daily. 08/07/17  Yes Paz, Alda Berthold, MD  apixaban (ELIQUIS) 2.5 MG TABS tablet Take 5 mg by mouth 2 (two) times daily. 08/30/20  Yes Paz, Alda Berthold, MD  ascorbic acid (VITAMIN C) 500 MG tablet Take 500 mg by mouth daily.   Yes [provider]  atorvastatin (LIPITOR) 10 MG tablet Take 1 tablet (10 mg total)  by mouth every other day. 08/30/20  Yes Paz, Alda Berthold, MD  cetirizine (ZYRTEC) 10 MG tablet Take 10 mg by mouth daily.   Yes [provider]  cholecalciferol (VITAMIN D) 1000 units tablet Take 1,000 Units by mouth daily.    Yes [provider]  Coenzyme Q10 (COQ10) 100 MG CAPS Take 100 mg by mouth daily.   Yes [provider]  fluticasone (FLONASE) 50 MCG/ACT nasal spray Place 2 sprays into both nostrils daily as needed for allergies.  01/27/16  Yes [provider]  hydrochlorothiazide (HYDRODIURIL) 25 MG tablet Take 25 mg by mouth daily.   Yes [provider]  insulin glargine (LANTUS) 100 UNIT/ML injection Inject 22 Units into the skin daily at 2 PM.   Yes [provider]  JARDIANCE 25 MG TABS tablet Take 12.5 mg by mouth daily. 10/19/19  Yes [provider]  magnesium oxide (MAG-OX) 400 MG tablet Take 1 tablet by mouth 2 (two) times daily. 06/26/20  Yes [provider]  tacrolimus (PROGRAF) 1 MG capsule Take 2-3 mg by mouth See admin instructions. Take 3 capsules every morning and take 2 capsules every evening   Yes [provider]     Critical care time: Ferol Luz Corneshia Hines ACNP Acute Care Nurse Practitioner [place] Please consult Amion 06/15/2022, 9:59 AM

## 2022-06-15 NOTE — ED Notes (Signed)
Pt returned from radiology. Dr Betsey Holiday at bedside

## 2022-06-15 NOTE — Progress Notes (Signed)
Pharmacy Antibiotic Note  CLAY SOLUM is a 67 y.o. male admitted on 06/14/2022 with pneumonia.  Pharmacy has been consulted for Zosyn dosing. WBC WNL. Scr 1.36.   Plan: Zosyn 3.375G IV q8h to be infused over 4 hours  Height: '5\' 8"'$  (172.7 cm) Weight: 76.6 kg (168 lb 14 oz) IBW/kg (Calculated) : 68.4  Temp (24hrs), Avg:97.8 F (36.6 C), Min:97.7 F (36.5 C), Max:98 F (36.7 C)  Recent Labs  Lab 06/14/22 2222  WBC 7.5  CREATININE 1.36*    Estimated Creatinine Clearance: 50.3 mL/min (A) (by C-G formula based on SCr of 1.36 mg/dL (H)).    Allergies  Allergen Reactions   Benazepril Hcl Swelling    angioedema   Amlodipine Besy-Benazepril Hcl Other (See Comments)   Erythromycin Ethylsuccinate     REACTION: GI Intolerance/vomiting   Keflex [Cephalexin] Diarrhea    With cramps and Nausea   Terazosin Swelling and Other (See Comments)    Other reaction(s): Hypotension   Narda Bonds, PharmD, BCPS Clinical Pharmacist Phone: [phone]

## 2022-06-16 DIAGNOSIS — R042 Hemoptysis: Secondary | ICD-10-CM | POA: Diagnosis not present

## 2022-06-16 LAB — CBC WITH DIFFERENTIAL/PLATELET
Abs Immature Granulocytes: 0.01 10*3/uL (ref 0.00–0.07)
Basophils Absolute: 0.1 10*3/uL (ref 0.0–0.1)
Basophils Relative: 1 %
Eosinophils Absolute: 0.2 10*3/uL (ref 0.0–0.5)
Eosinophils Relative: 2 %
HCT: 37.4 % — ABNORMAL LOW (ref 39.0–52.0)
Hemoglobin: 12.6 g/dL — ABNORMAL LOW (ref 13.0–17.0)
Immature Granulocytes: 0 %
Lymphocytes Relative: 21 %
Lymphs Abs: 1.9 10*3/uL (ref 0.7–4.0)
MCH: 29.9 pg (ref 26.0–34.0)
MCHC: 33.7 g/dL (ref 30.0–36.0)
MCV: 88.6 fL (ref 80.0–100.0)
Monocytes Absolute: 0.7 10*3/uL (ref 0.1–1.0)
Monocytes Relative: 8 %
Neutro Abs: 6 10*3/uL (ref 1.7–7.7)
Neutrophils Relative %: 68 %
Platelets: 200 10*3/uL (ref 150–400)
RBC: 4.22 MIL/uL (ref 4.22–5.81)
RDW: 14.1 % (ref 11.5–15.5)
WBC: 8.8 10*3/uL (ref 4.0–10.5)
nRBC: 0 % (ref 0.0–0.2)

## 2022-06-16 LAB — GLUCOSE, CAPILLARY
Glucose-Capillary: 103 mg/dL — ABNORMAL HIGH (ref 70–99)
Glucose-Capillary: 157 mg/dL — ABNORMAL HIGH (ref 70–99)
Glucose-Capillary: 204 mg/dL — ABNORMAL HIGH (ref 70–99)
Glucose-Capillary: 91 mg/dL (ref 70–99)
Glucose-Capillary: 98 mg/dL (ref 70–99)

## 2022-06-16 LAB — COMPREHENSIVE METABOLIC PANEL
ALT: 17 U/L (ref 0–44)
AST: 17 U/L (ref 15–41)
Albumin: 2.8 g/dL — ABNORMAL LOW (ref 3.5–5.0)
Alkaline Phosphatase: 57 U/L (ref 38–126)
Anion gap: 7 (ref 5–15)
BUN: 23 mg/dL (ref 8–23)
CO2: 26 mmol/L (ref 22–32)
Calcium: 8.2 mg/dL — ABNORMAL LOW (ref 8.9–10.3)
Chloride: 107 mmol/L (ref 98–111)
Creatinine, Ser: 1.11 mg/dL (ref 0.61–1.24)
GFR, Estimated: >60 mL/min (ref >60)
Glucose, Bld: 96 mg/dL (ref 70–99)
Potassium: 3.8 mmol/L (ref 3.5–5.1)
Sodium: 140 mmol/L (ref 135–145)
Total Bilirubin: 1 mg/dL (ref 0.3–1.2)
Total Protein: 6.6 g/dL (ref 6.5–8.1)

## 2022-06-16 LAB — HEMOGLOBIN AND HEMATOCRIT, BLOOD
HCT: 39.9 % (ref 39.0–52.0)
Hemoglobin: 12.8 g/dL — ABNORMAL LOW (ref 13.0–17.0)

## 2022-06-16 LAB — MAGNESIUM: Magnesium: 1.8 mg/dL (ref 1.7–2.4)

## 2022-06-16 MED ORDER — INSULIN GLARGINE-YFGN 100 UNIT/ML ~~LOC~~ SOLN
10.0000 [IU] | Freq: Every day | SUBCUTANEOUS | Status: DC
  Administered 2022-06-16: 10 [IU] via SUBCUTANEOUS
  Filled 2022-06-16 (×3): qty 0.1

## 2022-06-16 MED ORDER — PANTOPRAZOLE SODIUM 40 MG PO TBEC
40.0000 mg | DELAYED_RELEASE_TABLET | Freq: Every day | ORAL | Status: DC
  Administered 2022-06-16: 40 mg via ORAL
  Filled 2022-06-16: qty 1

## 2022-06-16 MED ORDER — APIXABAN 2.5 MG PO TABS: 5.0000 mg | ORAL_TABLET | Freq: Two times a day (BID) | ORAL | Status: AC

## 2022-06-16 NOTE — Consult Note (Addendum)
NAME:  Jeffery York, MRN:  [id number], DOB:  06/21/1955, LOS: 1 ADMISSION DATE:  06/14/2022, CONSULTATION DATE: 06/15/2022 REFERRING MD: Triad, CHIEF COMPLAINT: Hemoptysis  History of Present Illness:  67 year old male with known sarcoidosis who had a right upper lobectomy 2020 with multiple episodes of hemoptysis requiring fiberoptic bronchoscopy but no bleeder was ever identified.  He had a well-documented past medical history below and he presents with another episode of hemoptysis approximately 24 hours 5 episodes of 6 ounces of bright red blood.  He has not had hemoptysis since 06/14/2018 4 in the evening with his last episode.  Currently he is hemodynamically stable no acute distress.  He had a history of PE for which she was on Eliquis which has now been stopped.  We will continue to observe does not appear to need fiberoptic bronchoscopy at this time. Pertinent  Medical History   Past Medical History:  Diagnosis Date   Benign prostatic hypertrophy    Cirrhosis ([place])    Diabetes mellitus    initially 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 but then the diabetes resurface without the use of steroids   HCC (hepatocellular carcinoma) ([place])    s/p TACE and microwave ablation   Hep C w/o coma, chronic ([place])    bx aprox 12-10, s/p Harvoni, on a transplant list candidate    History of sarcoidosis    used steroids x 10 years   Hyperlipidemia    Hypertension    Liver transplant recipient [facility]) 2016   OSA (obstructive sleep apnea) 01/15/2018   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 rate 17, height '5\' 8"'$  (1.727 m), weight 76.6 kg, SpO2 100 %.        Intake/Output Summary  (Last 24 hours) at 06/16/2022 1257 Last data filed at 06/16/2022 0857 Gross per 24 hour  Intake 2272.25 ml  Output 650 ml  Net 1622.25 ml   Filed Weights   06/14/22 2211  Weight: 76.6 kg    Examination: General: Well-nourished well-developed male no acute distress HENT: Oropharynx is unremarkable no blood in visualized Lungs: Decreased breath sounds bilaterally Cardiovascular: Heart sounds are regular regular rate rhythm and Abdomen: Abdomen soft nontender Extremities: Extremities without edema Neuro: Grossly intact without focal defect GU: [facility] Problem list     Assessment & Plan:  History of sarcoid.  Hemoptysis approximately 24 hours in duration with approximately 6 ounces of bright red blood with approximately 5 episodes.  Has happened multiple times in the past.  He has had multiple bronchoscopies without any localization.  This occasion it appears to have been precipitated by COVID-19 beginning of this month.  He may have also had some epistaxis.  Bleeding is improved now off Eliquis  Abnormal CT chest with parenchymal changes consistent with sarcoidosis, evolving mycetoma and a thick-walled right upper lobe cavity, some groundglass infiltrate on the right that is most likely related to some pulmonary hemorrhage.  -Recommend staying off Eliquis about 5 days and then trying to restart -I believe that bronchoscopy would be low yield at this point but he may require if bleeding recurs.  He can follow-up with Dr. Halford Chessman  and arrange for this as an outpatient to decide if this is indicated -He will need a repeat CT scan of the chest in about 2 months to evaluate the areas of pulmonary hemorrhage for resolution.  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 Labs   CBC: Recent Labs  Lab 06/14/22 2222 06/15/22 0437 06/15/22 2030 06/16/22 0321 06/16/22 1127  WBC 7.5  8.9  --  8.8  --   NEUTROABS 3.7 6.0  --  6.0  --   HGB 14.9 15.1 14.4 12.6* 12.8*  HCT 46.7 43.0 45.4 37.4* 39.9  MCV 91.4 88.7  --  88.6  --   PLT 273 241  --  200  --     Basic Metabolic Panel: Recent Labs  Lab 06/14/22 2222 06/15/22 0437 06/15/22 2030 06/16/22 0321  NA 138 137  --  140  K 4.2 4.0  --  3.8  CL 102 102  --  107  CO2 28 28  --  26  GLUCOSE 158* 122*  --  96  BUN 37* 42*  --  23  CREATININE 1.36* 1.11  --  1.11  CALCIUM 9.1 8.9  --  8.2*  MG  --   --  2.0 1.8   GFR: Estimated Creatinine Clearance: 61.6 mL/min (by C-G formula based on SCr of 1.11 mg/dL). Recent Labs  Lab 06/14/22 2222 06/15/22 0437 06/16/22 0321  WBC 7.5 8.9 8.8    Liver Function Tests: Recent Labs  Lab 06/14/22 2222 06/15/22 0437 06/16/22 0321  AST '25 20 17  '$ ALT '24 22 17  '$ ALKPHOS 70 69 57  BILITOT 0.6 0.7 1.0  PROT 7.9 7.6 6.6  ALBUMIN 3.3* 3.4* 2.8*   No results for input(s): "LIPASE", "AMYLASE" in the last 168 hours. Recent Labs  Lab 06/15/22 0437  AMMONIA 36*    ABG    Component Value Date/Time   TCO2 29 10/24/2007 1120     Coagulation Profile: No results for input(s): "INR", "PROTIME" in the last 168 hours.  Cardiac Enzymes: No results for input(s): "CKTOTAL", "CKMB", "CKMBINDEX", "TROPONINI" in the last 168 hours.  HbA1C: Hemoglobin A1C  Date/Time Value Ref Range Status  07/08/2021 12:00 AM 6.8  Final  01/14/2021 12:00 AM 6.6  Final   Hgb A1c MFr Bld  Date/Time Value Ref Range Status  06/15/2022 04:37 AM 6.6 (H) 4.8 - 5.6 % Final    Comment:    (NOTE) Pre diabetes:          5.7%-6.4%  Diabetes:              >6.4%  Glycemic control for   <7.0% adults with diabetes     CBG: Recent Labs  Lab 06/15/22 2052 06/16/22 0007 06/16/22 0544 06/16/22 0800 06/16/22 1156  GLUCAP 148* 157* 91 98 103*    Critical care time: na     Baltazar Apo, MD, PhD 06/16/2022, 12:58 PM [facility] [phone] or if no answer  before 7:00PM call [phone] For any issues after 7:00PM please call eLink [phone]

## 2022-06-16 NOTE — Discharge Summary (Signed)
Physician Discharge Summary  Jeffery York RCV:[id number] DOB: Sep 14, 1955 DOA: 06/14/2022  PCP: Percell Belt, DO  Admit date: 06/14/2022 Discharge date: 06/16/2022  Admitted From: Home Discharge disposition: Home  Recommendations at discharge:  Stay off Eliquis for 5 days.  Self monitor for further recurrence of bleeding after restarting. Follow-up with Dr. Halford Chessman as an outpatient.  May need repeat CT scan of chest in about 2 months to evaluate areas of pulmonary respiratory dilution.   Brief narrative: Jeffery York is a 67 y.o. 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  He recently had COVID-19 10 days ago and had blood-tinged sputum intermittently.  On the day of presentation, he had a large amount of bright red blood in his sputum and hence came to the ED. Patient has a history of pulmonary embolism in 2021. His pulmonologist have left on the half dose of Eliquis, citing he has a high possibility of recurrent PE.   In the ED, patient was afebrile, hemodynamically stable, O2 sat was low at 85% on room air Labs with unremarkable CBC, CMP with BUN/creatinine 37/1.36, troponin normal Respiratory virus panel unremarkable for flu, COVID, RSV CT angio chest did not show evidence of pulm embolism.  However it showed chronic changes secondary to pulmonary sarcoidosis, pulmonary artery hypertension. It also showed an interval development of rounded soft tissue mass within the previously noted cavitary lesion within the right upper lobe suspicious for a developing mycetoma. Interval development of extensive ground-glass and consolidative infiltrate within the superior segment of the right lower lobe. Inflammatory appearing ground-glass nodularity within the right upper lobe and left lower lobe. These  findings may relate to multifocal pneumonia. Alternatively, pulmonary hemorrhage with endobronchial extension/aspiration of hemorrhage could appear similarly.  Admitted to [facility]  Subjective: Patient was seen and examined this morning.   Lying on bed not on not in distress.  No recurrence of hemoptysis.  Mild drop in hemoglobin noted probably because of dilation.  Hospital course: Abnormal CT scan findings  Presented with intermittent hematemesis culminating on large amount of bright red blood in cough on 1/10 in the setting of chronic sarcoidosis, history of aspergilloma and recent diagnosis of COVID-19 CT chest finding as above raising suspicions of mycetoma, multifocal pneumonia as well as pulmonary hemorrhage Pulmonary consultation was called  Per pulmonary note, patient had hemoptysis approximately 24 hours in duration with approximately 6 ounces of bright red blood with approximately 5 episodes.  Per report, he had it multiple times in the past.  He has had multiple bronchoscopies without any localization.  This occasion it appears to have been precipitated by COVID-19 beginning of this month.  He may have also had some epistaxis.  Bleeding is improved now off Eliquis   Abnormal CT chest with parenchymal changes consistent with sarcoidosis, evolving mycetoma and a thick-walled right upper lobe cavity, some groundglass infiltrate on the right that is most likely related to some pulmonary hemorrhage.   Pulmonary recommended staying off Eliquis about 5 days and then trying to restart It was determined that at this time bronchoscopy would be low yield at this point but he may require if bleeding recurs.  He can follow-up with Dr. Halford Chessman and arrange for this as an outpatient to decide if this is indicated He will need a repeat CT scan of the  chest in about 2 months to evaluate the areas of pulmonary hemorrhage for resolution.  This will also allow [place] to follow his right upper lobe cavity, possible  evolving mycetoma  There is mild drop in hemoglobin today probably because of dilution. Recent Labs    06/14/22 2222 06/15/22 0437 06/15/22 2030 06/16/22 0321 06/16/22 1127  HGB 14.9 15.1 14.4 12.6* 12.8*  MCV 91.4 88.7  --  88.6  --    AKI Creatinine improved with hydration overnight Recent Labs    06/14/22 2222 06/15/22 0437 06/16/22 0321  BUN 37* 42* 23  CREATININE 1.36* 1.11 1.11   S/p liver transplant 2016  -for hep C, liver cirrhosis, HCC Normal LFTs Continue tacrolimus twice daily   Recent COVID Evidently diagnosed with COVID-19 on January 1 and completed a course of molnupiravir Respiratory virus panel negative on this admission  Chronic pulmonary sarcoidosis Changes noticed on CT chest.  Not on chronic long-term treatment for sarcoidosis.  Type 2 diabetes mellitus Hypoglycemia this morning A1c 6.6 on 06/15/2022 PTA on Lantus 22 units daily, Jardiance 12.5 mg daily Resume the same post discharge. Recent Labs  Lab 06/15/22 2052 06/16/22 0007 06/16/22 0544 06/16/22 0800 06/16/22 1156  GLUCAP 148* 157* 91 98 103*   Essential hypertension PTA on HCTZ 25 mg daily, amlodipine 10 mg daily Continue both at discharge  Hyperlipidemia Aortic atherosclerosis PTA on Eliquis 2.5-minute daily, Lipitor 10 mg every other day Eliquis plan as above continue Lipitor  History of PE Eliquis plan as above  OSA Nightly CPAP  Gout Allopurinol   Mobility: Encourage ambulation  Goals of care   Code Status: Full Code   Wounds:  -    Discharge Exam:   Vitals:   06/15/22 2050 06/16/22 0020 06/16/22 0547 06/16/22 0804  BP: 113/67 120/70 118/67 119/72  Pulse: (!) 57 (!) 58 60 61  Resp: '18 16 16 17  '$ Temp: 98 F (36.7 C) 97.8 F (36.6 C) 97.9 F (36.6 C) 97.9 F (36.6 C)  TempSrc: Oral Oral Oral Oral  SpO2: 98% 99% 100% 100%  Weight:      Height:        Body mass index is 25.68 kg/m.   General exam: Pleasant, elderly African-American male.  Not  in distress Skin: No rashes, lesions or ulcers. HEENT: Atraumatic, normocephalic, no obvious bleeding Lungs: Clear to auscultation bilaterally CVS: Regular rate and rhythm, no murmur GI/Abd soft, nontender, nondistended, bowel sound present CNS: Alert, awake, oriented x 3 Psychiatry: Mood appropriate Extremities: No pedal edema, no calf tenderness  Follow ups:    Follow-up Information     Lazoff, Shawn P, DO Follow up.   Specialty: Family Medicine Contact information: [address] [place] [address] [place] [address] [phone]                 Discharge Instructions:   Discharge Instructions     Call MD for:  difficulty breathing, headache or visual disturbances   Complete by: As directed    Call MD for:  extreme fatigue   Complete by: As directed    Call MD for:  hives   Complete by: As directed    Call MD for:  persistant dizziness or light-headedness   Complete by: As directed    Call MD for:  persistant nausea and vomiting   Complete by: As directed    Call MD for:  severe uncontrolled pain   Complete by: As directed    Call MD for:  temperature >100.4  Complete by: As directed    Diet Carb Modified   Complete by: As directed    Diet general   Complete by: As directed    Discharge instructions   Complete by: As directed    Recommendations at discharge:   Stay off Eliquis for 5 days.  Self monitor for further recurrence of bleeding after restarting.  Follow-up with Dr. Halford Chessman as an outpatient.  May need repeat CT scan of chest in about 2 months to evaluate areas of pulmonary respiratory dilution.   Discharge instructions for diabetes mellitus: Check blood sugar 3 times a day and bedtime at home. If blood sugar running above 200 or less than 70 please call your MD to adjust insulin. If you notice signs and symptoms of hypoglycemia (low blood sugar) like jitteriness, confusion, thirst, tremor and sweating, please check blood sugar, drink sugary  drink/biscuits/sweets to increase sugar level and call MD or return to ER.    General discharge instructions: Follow with Primary MD Lazoff, Shawn P, DO in 7 days  Please request your PCP  to go over your hospital tests, procedures, radiology results at the follow up. Please get your medicines reviewed and adjusted.  Your PCP may decide to repeat certain labs or tests as needed. Do not drive, operate heavy machinery, perform activities at heights, swimming or participation in water activities or provide baby sitting services if your were admitted for syncope or siezures until you have seen by Primary MD or a Neurologist and advised to do so again. [place] Controlled Substance Reporting System database was reviewed. Do not drive, operate heavy machinery, perform activities at heights, swim, participate in water activities or provide baby-sitting services while on medications for pain, sleep and mood until your outpatient physician has reevaluated you and advised to do so again.  You are strongly recommended to comply with the dose, frequency and duration of prescribed medications. Activity: As tolerated with Full fall precautions use walker/cane & assistance as needed Avoid using any recreational substances like cigarette, tobacco, alcohol, or non-prescribed drug. If you experience worsening of your admission symptoms, develop shortness of breath, life threatening emergency, suicidal or homicidal thoughts you must seek medical attention immediately by calling 911 or calling your MD immediately  if symptoms less severe. You must read complete instructions/literature along with all the possible adverse reactions/side effects for all the medicines you take and that have been prescribed to you. Take any new medicine only after you have completely understood and accepted all the possible adverse reactions/side effects.  Wear Seat belts while driving. You were cared for by a hospitalist during your  hospital stay. If you have any questions about your discharge medications or the care you received while you were in the hospital after you are discharged, you can call the unit and ask to speak with the hospitalist or the covering physician. Once you are discharged, your primary care physician will handle any further medical issues. Please note that NO REFILLS for any discharge medications will be authorized once you are discharged, as it is imperative that you return to your primary care physician (or establish a relationship with a primary care physician if you do not have one).   Increase activity slowly   Complete by: As directed        Discharge Medications:   Allergies as of 06/16/2022       Reactions   Benazepril Hcl Swelling   angioedema   Amlodipine Besy-benazepril Hcl Other (See Comments)  Erythromycin Ethylsuccinate    REACTION: GI Intolerance/vomiting   Keflex [cephalexin] Diarrhea   With cramps and Nausea   Terazosin Swelling, Other (See Comments)   Other reaction(s): Hypotension        Medication List     TAKE these medications    allopurinol 300 MG tablet Commonly known as: ZYLOPRIM Take 300 mg by mouth daily.   amLODipine 10 MG tablet Commonly known as: NORVASC Take 1 tablet (10 mg total) by mouth daily.   apixaban 2.5 MG Tabs tablet Commonly known as: ELIQUIS Take 2 tablets (5 mg total) by mouth 2 (two) times daily. Start taking on: June 21, 2022 What changed: These instructions start on June 21, 2022. If you are unsure what to do until then, ask your doctor or other care provider.   ascorbic acid 500 MG tablet Commonly known as: VITAMIN C Take 500 mg by mouth daily.   atorvastatin 10 MG tablet Commonly known as: LIPITOR Take 1 tablet (10 mg total) by mouth every other day.   cetirizine 10 MG tablet Commonly known as: ZYRTEC Take 10 mg by mouth daily.   cholecalciferol 1000 units tablet Commonly known as: VITAMIN D Take 1,000 Units by  mouth daily.   CoQ10 100 MG Caps Take 100 mg by mouth daily.   fluticasone 50 MCG/ACT nasal spray Commonly known as: FLONASE Place 2 sprays into both nostrils daily as needed for allergies.   hydrochlorothiazide 25 MG tablet Commonly known as: HYDRODIURIL Take 25 mg by mouth daily.   insulin glargine 100 UNIT/ML injection Commonly known as: LANTUS Inject 22 Units into the skin daily at 2 PM.   Jardiance 25 MG Tabs tablet Generic drug: empagliflozin Take 12.5 mg by mouth daily.   magnesium oxide 400 MG tablet Commonly known as: MAG-OX Take 1 tablet by mouth 2 (two) times daily.   tacrolimus 1 MG capsule Commonly known as: PROGRAF Take 2-3 mg by mouth See admin instructions. Take 3 capsules every morning and take 2 capsules every evening         The results of significant diagnostics from this hospitalization (including imaging, microbiology, ancillary and laboratory) are listed below for reference.    Procedures and Diagnostic Studies:   CT Angio Chest PE W and/or Wo Contrast  Result Date: 06/15/2022 CLINICAL DATA:  Pulmonary embolism (PE) suspected, high prob hx of PE, hemoptysis EXAM: CT ANGIOGRAPHY CHEST WITH CONTRAST TECHNIQUE: Multidetector CT imaging of the chest was performed using the standard protocol during bolus administration of intravenous contrast. Multiplanar CT image reconstructions and MIPs were obtained to evaluate the vascular anatomy. RADIATION DOSE REDUCTION: This exam was performed according to the departmental dose-optimization program which includes automated exposure control, adjustment of the mA and/or kV according to patient size and/or use of iterative reconstruction technique. CONTRAST:  80m OMNIPAQUE IOHEXOL 350 MG/ML SOLN COMPARISON:  08/13/2020 FINDINGS: Cardiovascular: There is adequate opacification of the pulmonary arterial tree. No intraluminal filling defect identified to suggest acute pulmonary embolism. The central pulmonary arteries are  mildly enlarged suggesting changes of pulmonary arterial hypertension. Moderate coronary artery calcification within the left anterior descending and ramus intermedius arteries. Cardiac size within normal limits. No pericardial effusion. Mild atherosclerotic calcification within the thoracic aorta. No aortic aneurysm. The bronchial arteries are enlarged. Left bronchial artery is not optimally delineated given the early phase of enhancement. The right bronchial artery, however, is seen arising anteriorly from the aorta at the level of the superior endplate of T6 (image # 104/6). Mediastinum/Nodes: No  enlarged mediastinal, hilar, or axillary lymph nodes. Thyroid gland, trachea, and esophagus demonstrate no significant findings. Lungs/Pleura: There is architectural distortion with cylindrical bronchiectasis, honeycombing, volume loss, and superior height hilar retraction within the lung apices bilaterally in keeping with chronic changes of pulmonary sarcoidosis. Milder changes are noted within the superior segment of the lower lobes bilaterally. These findings appear similar in severity to prior examination. The previously noted cavitary lesion within the right upper lobe now demonstrates rounded soft tissue which is suspicious for a developing mycetoma, best seen on axial image # 30/7 and coronal image # 71/8. Additionally, there has developed extensive ground-glass and consolidative infiltrate within the superior segment of the right lower lobe. Inflammatory appearing ground-glass nodularity is seen within the right upper lobe and left lower lobe. These findings may relate to multifocal pneumonia. Alternatively, pulmonary hemorrhage with endobronchial extension/aspiration of hemorrhage could appear similarly. No pneumothorax or pleural effusion. No central obstructing lesion. Upper Abdomen: No acute abnormality. Musculoskeletal: No acute bone abnormality. No lytic or blastic bone lesion. Review of the MIP images  confirms the above findings. IMPRESSION: 1. No pulmonary embolism. 2. Morphologic changes in keeping with pulmonary arterial hypertension. 3. Moderate coronary artery calcification. 4. Chronic changes of pulmonary sarcoidosis, similar in severity to prior examination. 5. Interval development of rounded soft tissue mass within the previously noted cavitary lesion within the right upper lobe suspicious for a developing mycetoma. 6. Interval development of extensive ground-glass and consolidative infiltrate within the superior segment of the right lower lobe. Inflammatory appearing ground-glass nodularity is seen within the right upper lobe and left lower lobe. These findings may relate to multifocal pneumonia. Alternatively, pulmonary hemorrhage with endobronchial extension/aspiration of hemorrhage could appear similarly. 7. Enlarged bronchial arteries bilaterally. Left bronchial artery is not optimally delineated given the early phase of enhancement. The right bronchial artery, however, is seen arising anteriorly from the aorta at the level of the superior endplate of T6. Aortic Atherosclerosis (ICD10-I70.0). Electronically Signed   By: Fidela Salisbury M.D.   On: 06/15/2022 01:24   DG Chest Port 1 View  Result Date: 06/14/2022 CLINICAL DATA:  Shortness of breath, hemoptysis. EXAM: PORTABLE CHEST 1 VIEW COMPARISON:  CT chest dated August 13, 2020 FINDINGS: The heart size and mediastinal contours are within normal limits. Postsurgical changes about the right hilum consistent with right upper lobectomy. Volume loss of the right lung. Bilateral reticular opacities with cystic changes consistent with chronic lung disease, unchanged. Acute infectious/inflammatory process in the settings of chronic lung disease can not be excluded. IMPRESSION: Bilateral reticular opacities with cystic changes consistent with chronic lung disease, unchanged. Acute infectious/inflammatory process in the settings of chronic lung disease can  not be excluded. Electronically Signed   By: Keane Police D.O.   On: 06/14/2022 22:56     Labs:   Basic Metabolic Panel: Recent Labs  Lab 06/14/22 2222 06/15/22 0437 06/15/22 2030 06/16/22 0321  NA 138 137  --  140  K 4.2 4.0  --  3.8  CL 102 102  --  107  CO2 28 28  --  26  GLUCOSE 158* 122*  --  96  BUN 37* 42*  --  23  CREATININE 1.36* 1.11  --  1.11  CALCIUM 9.1 8.9  --  8.2*  MG  --   --  2.0 1.8   GFR Estimated Creatinine Clearance: 61.6 mL/min (by C-G formula based on SCr of 1.11 mg/dL). Liver Function Tests: Recent Labs  Lab 06/14/22 2222 06/15/22 0867  06/16/22 0321  AST '25 20 17  '$ ALT '24 22 17  '$ ALKPHOS 70 69 57  BILITOT 0.6 0.7 1.0  PROT 7.9 7.6 6.6  ALBUMIN 3.3* 3.4* 2.8*   No results for input(s): "LIPASE", "AMYLASE" in the last 168 hours. Recent Labs  Lab 06/15/22 0437  AMMONIA 36*   Coagulation profile No results for input(s): "INR", "PROTIME" in the last 168 hours.  CBC: Recent Labs  Lab 06/14/22 2222 06/15/22 0437 06/15/22 2030 06/16/22 0321 06/16/22 1127  WBC 7.5 8.9  --  8.8  --   NEUTROABS 3.7 6.0  --  6.0  --   HGB 14.9 15.1 14.4 12.6* 12.8*  HCT 46.7 43.0 45.4 37.4* 39.9  MCV 91.4 88.7  --  88.6  --   PLT 273 241  --  200  --    Cardiac Enzymes: No results for input(s): "CKTOTAL", "CKMB", "CKMBINDEX", "TROPONINI" in the last 168 hours. BNP: Invalid input(s): "POCBNP" CBG: Recent Labs  Lab 06/15/22 2052 06/16/22 0007 06/16/22 0544 06/16/22 0800 06/16/22 1156  GLUCAP 148* 157* 91 98 103*   D-Dimer No results for input(s): "DDIMER" in the last 72 hours. Hgb A1c Recent Labs    06/15/22 0437  HGBA1C 6.6*   Lipid Profile No results for input(s): "CHOL", "HDL", "LDLCALC", "TRIG", "CHOLHDL", "LDLDIRECT" in the last 72 hours. Thyroid function studies No results for input(s): "TSH", "T4TOTAL", "T3FREE", "THYROIDAB" in the last 72 hours.  Invalid input(s): "FREET3" Anemia work up No results for input(s): "VITAMINB12",  "FOLATE", "FERRITIN", "TIBC", "IRON", "RETICCTPCT" in the last 72 hours. Microbiology Recent Results (from the past 240 hour(s))  Resp panel by RT-PCR (RSV, Flu A&B, Covid) Anterior Nasal Swab     Status: None   Collection Time: 06/14/22 10:16 PM   Specimen: Anterior Nasal Swab  Result Value Ref Range Status   SARS Coronavirus 2 by RT PCR NEGATIVE NEGATIVE Final    Comment: (NOTE) SARS-CoV-2 target nucleic acids are NOT DETECTED.  The SARS-CoV-2 RNA is generally detectable in upper respiratory specimens during the acute phase of infection. The lowest concentration of SARS-CoV-2 viral copies this assay can detect is 138 copies/mL. A negative result does not preclude SARS-Cov-2 infection and should not be used as the sole basis for treatment or other patient management decisions. A negative result may occur with  improper specimen collection/handling, submission of specimen other than nasopharyngeal swab, presence of viral mutation(s) within the areas targeted by this assay, and inadequate number of viral copies(<138 copies/mL). A negative result must be combined with clinical observations, patient history, and epidemiological information. The expected result is Negative.  Fact Sheet for Patients:  EntrepreneurPulse.com.au  Fact Sheet for Healthcare Providers:  IncredibleEmployment.be  This test is no t yet approved or cleared by the [place] FDA and  has been authorized for detection and/or diagnosis of SARS-CoV-2 by FDA under an Emergency Use Authorization (EUA). This EUA will remain  in effect (meaning this test can be used) for the duration of the COVID-19 declaration under Section 564(b)(1) of the Act, 21 U.S.C.section 360bbb-3(b)(1), unless the authorization is terminated  or revoked sooner.       Influenza A by PCR NEGATIVE NEGATIVE Final   Influenza B by PCR NEGATIVE NEGATIVE Final    Comment: (NOTE) The Xpert Xpress  SARS-CoV-2/FLU/RSV plus assay is intended as an aid in the diagnosis of influenza from Nasopharyngeal swab specimens and should not be used as a sole basis for treatment. Nasal washings and aspirates are unacceptable for Xpert  Xpress SARS-CoV-2/FLU/RSV testing.  Fact Sheet for Patients: EntrepreneurPulse.com.au  Fact Sheet for Healthcare Providers: IncredibleEmployment.be  This test is not yet approved or cleared by the [place] FDA and has been authorized for detection and/or diagnosis of SARS-CoV-2 by FDA under an Emergency Use Authorization (EUA). This EUA will remain in effect (meaning this test can be used) for the duration of the COVID-19 declaration under Section 564(b)(1) of the Act, 21 U.S.C. section 360bbb-3(b)(1), unless the authorization is terminated or revoked.     Resp Syncytial Virus by PCR NEGATIVE NEGATIVE Final    Comment: (NOTE) Fact Sheet for Patients: EntrepreneurPulse.com.au  Fact Sheet for Healthcare Providers: IncredibleEmployment.be  This test is not yet approved or cleared by the [place] FDA and has been authorized for detection and/or diagnosis of SARS-CoV-2 by FDA under an Emergency Use Authorization (EUA). This EUA will remain in effect (meaning this test can be used) for the duration of the COVID-19 declaration under Section 564(b)(1) of the Act, 21 U.S.C. section 360bbb-3(b)(1), unless the authorization is terminated or revoked.  Performed at [facility], [place] [address], [place], [place] [address]   Culture, blood (Routine X 2) w Reflex to ID Panel     Status: None (Preliminary result)   Collection Time: 06/15/22  4:00 AM   Specimen: BLOOD LEFT ARM  Result Value Ref Range Status   Specimen Description BLOOD LEFT ARM  Final   Special Requests   Final    BOTTLES DRAWN AEROBIC AND ANAEROBIC Blood Culture adequate volume   Culture   Final    NO GROWTH <  12 HOURS Performed at [facility], [place] [address], [place], [place] [address]    Report Status PENDING  Incomplete  Culture, blood (Routine X 2) w Reflex to ID Panel     Status: None (Preliminary result)   Collection Time: 06/15/22  4:25 AM   Specimen: BLOOD RIGHT ARM  Result Value Ref Range Status   Specimen Description BLOOD RIGHT ARM  Final   Special Requests   Final    BOTTLES DRAWN AEROBIC AND ANAEROBIC Blood Culture adequate volume   Culture   Final    NO GROWTH < 12 HOURS Performed at [facility], [place] [address], [place], [place] [address]    Report Status PENDING  Incomplete    Time coordinating discharge: 35 minutes  Signed: Ligaya Cormier  [facility] 06/16/2022, 1:40 PM

## 2022-06-17 LAB — TACROLIMUS LEVEL: Tacrolimus (FK506) - LabCorp: 6.7 ng/mL (ref 2.0–20.0)

## 2022-06-20 LAB — CULTURE, BLOOD (ROUTINE X 2)
Culture: NO GROWTH
Culture: NO GROWTH
Special Requests: ADEQUATE
Special Requests: ADEQUATE

## 2022-06-22 ENCOUNTER — Telehealth: Payer: Self-pay | Admitting: Pulmonary Disease

## 2022-06-26 NOTE — Telephone Encounter (Signed)
Attempted to call pt but unable to reach. Left message to return call.

## 2022-06-26 NOTE — Telephone Encounter (Signed)
Spoke with pt who states he was seen in recent hospitalization for hemoptysis by Dr. Lamonte Sakai who wanted pt to follow up with Dr. Halford Chessman. Pt states his blood thinner was doubled to 5 mg while in the hospital after being stopped for 5 days. Pt is wondering if he will stay at 5 mg daily or go back down to 2.5 mg. Pt scheduled for OV with Tammy Parrett on 07/03/22.   Routing to [employer] as [employer]

## 2022-06-26 NOTE — Telephone Encounter (Signed)
Patient called back to discuss blood thinner.

## 2022-06-26 NOTE — Telephone Encounter (Signed)
According to the notes patient was to resume previous dose of Eliquis which I believe was 2.5 mg twice daily please confirm his previous dose of Eliquis prior to going into the hospital

## 2022-06-27 NOTE — Telephone Encounter (Signed)
Called Pt and confirmed that he was to go back to his previous dose of Eliquis 2.5 after leaving the hospital. Pt stated understanding and nothing further needed.

## 2022-07-03 ENCOUNTER — Ambulatory Visit (INDEPENDENT_AMBULATORY_CARE_PROVIDER_SITE_OTHER): Payer: Medicare PPO | Admitting: Adult Health

## 2022-07-03 ENCOUNTER — Encounter: Payer: Self-pay | Admitting: Adult Health

## 2022-07-03 VITALS — BP 126/70 | HR 68 | Temp 98.4°F | Ht 68.0 in | Wt 166.8 lb

## 2022-07-03 DIAGNOSIS — R042 Hemoptysis: Secondary | ICD-10-CM

## 2022-07-03 DIAGNOSIS — Z86711 Personal history of pulmonary embolism: Secondary | ICD-10-CM

## 2022-07-03 DIAGNOSIS — D869 Sarcoidosis, unspecified: Secondary | ICD-10-CM

## 2022-07-03 DIAGNOSIS — G4733 Obstructive sleep apnea (adult) (pediatric): Secondary | ICD-10-CM | POA: Diagnosis not present

## 2022-07-03 DIAGNOSIS — J189 Pneumonia, unspecified organism: Secondary | ICD-10-CM | POA: Diagnosis not present

## 2022-07-03 NOTE — Assessment & Plan Note (Signed)
History of PE.  Continue on Eliquis low-dose

## 2022-07-03 NOTE — Assessment & Plan Note (Signed)
Recurrent hemoptysis.  Most recent episode was earlier in January 2024.  This has resolved.  This did occur after recent COVID-19 infection.  He tested positive on January 1.  Seems to be doing well since discharge.  Plan  Patient Instructions  Continue on Eliquis 2.'5mg'$  Twice daily.  CT chest in 6 weeks  Continue on CPAP At bedtime   Saline nasal spray Twice daily   Saline nasal gel At bedtime   Follow up with Dr. Halford Chessman  in 6 weeks and As needed   Please contact office for sooner follow up if symptoms do not improve or worsen or seek emergency care

## 2022-07-03 NOTE — Patient Instructions (Addendum)
Continue on Eliquis 2.'5mg'$  Twice daily.  CT chest in 6 weeks  Continue on CPAP At bedtime   Saline nasal spray Twice daily   Saline nasal gel At bedtime   Follow up with Dr. Halford Chessman  in 6 weeks and As needed   Please contact office for sooner follow up if symptoms do not improve or worsen or seek emergency care

## 2022-07-03 NOTE — Progress Notes (Signed)
$'@Patient'l$  ID: Jeffery York, male    DOB: 05-18-1956, 67 y.o.   MRN: [id number]  Chief Complaint  Patient presents with   Hospitalization Follow-up    Referring provider: Percell Belt, DO  HPI: 67 year old male former smoker followed for obstructive sleep apnea and pulmonary sarcoidosis.  Status post right upper lobectomy for aspergilloma in 2000. Medical history significant for cirrhosis secondary to hepatitis C status post liver transplant, uveitis, diabetes, history of PE  TEST/EVENTS :  ACE 01/20/20 >> 22   Chest Imaging:  CT chest 03/08/17 >> extensive scarring, s/p RULectomy CT angio chest 01/10/20 >> small PE in LUL and RML, GGO in RLL, s/p Rt upper lobectomy, apical scarring HRCT chest 08/14/20 >> no change in appearance for sarcoidosis   Sleep Tests:  HST 01/11/18 >> AHI 19.9, SaO2 low 78% Auto CPAP 11/01/21 to 01/29/22 >> used on 88 of 90 nights with average 6 hrs 47 min.  Average AHI 2.2 with median CPAP 7 and 95 th percentile CPAP 11 cm H2O    07/03/2022 Follow up: Post hospital follow up , Hemoptysis, Sarcoid  Patient returns for a posthospital follow-up.  Patient was hospitalized earlier this month for recurrent hemoptysis.  As above patient has underlying sarcoidosis and has had previous right upper lobectomy in 2000 for aspergilloma.   Patient has not had hemoptysis for 2 to 3 years.  But earlier this month had development of hemoptysis after recent case of COVID-19 and also had had an episode of nosebleeds.  Patient coughed up blood several times.  He was admitted to the hospital, and was seen by pulmonary critical care.  His Eliquis was held.  He did not require bronchoscopy.  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  Patient's Eliquis was held for 5 days and then restarted.  Patient says his hemoptysis has resolved.  He is feeling better.  He does remain somewhat weak and has lower energy.  Patient is  very active typically exercises every day.  He denies any fever, chest pain, abdominal pain nausea vomiting.  Appetite is good. Patient does tell me he has a personal steam sauna at home and he uses this daily.  We discussed the importance of keeping this clean  also he is immunocompromised-and could put him at risk for exposure to certain germs.  Patient does have sleep apnea.  Is on CPAP at bedtime.  Wears a CPAP every single night.  Feels that he is doing well on CPAP.  Feels he benefits from CPAP with decreased daytime sleepiness.  CPAP download shows excellent compliance with daily average usage at 7.5 hours.  Patient is on auto CPAP 5 to 1015 cm H2O.  AHI 3.0   Allergies  Allergen Reactions   Benazepril Hcl Swelling    angioedema   Amlodipine Besy-Benazepril Hcl Other (See Comments)   Erythromycin Ethylsuccinate     REACTION: GI Intolerance/vomiting   Keflex [Cephalexin] Diarrhea    With cramps and Nausea   Terazosin Swelling and Other (See Comments)    Other reaction(s): Hypotension    Immunization History  Administered Date(s) Administered   Fluad Quad(high Dose 65+) 03/03/2021, 03/06/2022   H1N1 06/23/2008   Hep A / Hep B 05/26/2013   Influenza Split 04/16/2007, 03/09/2009, 04/05/2010, 05/05/2011   Influenza Whole 04/16/2007, 03/09/2009, 04/05/2010   Influenza, High Dose Seasonal PF 03/03/2021   Influenza, Quadrivalent, Recombinant, Inj, Pf 04/14/2019   Influenza, Seasonal, Injecte, Preservative  Fre 04/30/2013   Influenza,inj,Quad PF,6+ Mos 04/06/2016, 04/03/2017, 03/25/2018, 02/16/2020   Influenza-Unspecified 05/05/2011, 06/04/2014, 02/04/2015, 03/10/2015, 02/04/2016, 03/03/2021   Moderna Covid-19 Vaccine Bivalent Booster 1yr & up 03/03/2021   Moderna Sars-Covid-2 Vaccination 06/25/2019, 07/22/2019, 01/21/2020, 09/01/2020   Pfizer Covid-19 Vaccine Bivalent Booster 146yr& up 07/08/2021   Pneumococcal Conjugate-13 07/23/2014   Pneumococcal Polysaccharide-23 03/05/2006,  05/21/2013, 01/15/2020   Td 06/05/2002   Tdap 05/21/2013   Zoster Recombinat (Shingrix) 01/14/2021, 07/08/2021   Zoster, Live 11/04/2014    Past Medical History:  Diagnosis Date   Benign prostatic hypertrophy    [place]   Diabetes mellitus    initially 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 but then the diabetes resurface without the use of steroids   H[facility] (hepatocellular [place]) [place]   s/p TACE and microwave ablation   [place] w/o coma, chronic [place]   bx aprox 12-10, s/p Harvoni, on a transplant list candidate    History of sarcoidosis    used steroids x 10 years   Hyperlipidemia    Hypertension    [place]   OSA (obstructive sleep apnea) 01/15/2018   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      amLODipine (NORVASC) 10 MG tablet Take 1 tablet (10 mg total) by mouth daily. 90 tablet 1   apixaban (ELIQUIS) 2.5 MG TABS tablet Take 2 tablets (5 mg total) by mouth 2 (two) times daily. 60 tablet    ascorbic acid (VITAMIN C) 500 MG tablet Take 500 mg by mouth daily.     atorvastatin (LIPITOR) 10 MG tablet Take 1 tablet (10 mg total) by mouth every other day.     cetirizine (ZYRTEC) 10 MG tablet Take 10 mg by mouth daily.     cholecalciferol (VITAMIN D) 1000 units tablet Take 1,000 Units by mouth daily.      Coenzyme Q10 (COQ10) 100 MG CAPS Take 100 mg by mouth daily.     fluticasone (FLONASE) 50 MCG/ACT nasal spray Place 2 sprays into both nostrils daily as needed for allergies.       hydrochlorothiazide (HYDRODIURIL) 25 MG tablet Take 25 mg by mouth daily.     insulin glargine (LANTUS) 100 UNIT/ML injection Inject 22 Units into the skin daily at 2 PM.     JARDIANCE 25 MG TABS tablet Take 12.5 mg by mouth daily.     magnesium oxide (MAG-OX) 400 MG tablet Take 1 tablet by mouth 2 (two) times daily.     tacrolimus (PROGRAF) 1 MG capsule Take 2-3 mg by mouth See admin instructions. Take 3 capsules every morning and take 2 capsules every evening     No facility-administered medications prior to visit.     Review of Systems:   Constitutional:   No  weight loss, night sweats,  Fevers, chills,  +fatigue, or  lassitude.  HEENT:   No headaches,  Difficulty swallowing,  Tooth/dental problems, or  Sore throat,                No sneezing, itching, ear ache, nasal congestion, post nasal drip,   CV:  No chest pain,  Orthopnea, PND, swelling in lower extremities, anasarca, dizziness, palpitations, syncope.   GI  No heartburn, indigestion, abdominal pain, nausea, vomiting, diarrhea, change in bowel habits, loss of appetite, bloody stools.   Resp:.  No chest wall deformity  Skin: no rash or lesions.  GU: no dysuria, change in color of urine, no urgency or frequency.  No flank pain, no hematuria   MS:  No joint pain or swelling.  No decreased range of motion.  No back pain.    Physical Exam  BP 126/70 (BP Location: Left Arm, Patient Position: Sitting, Cuff Size: Normal)   Pulse 68   Temp 98.4 F (36.9 C) (Oral)   Ht '5\' 8"'$  (1.727 m)   Wt 166 lb 12.8 oz (75.7 kg)   SpO2 97%   BMI 25.36 kg/m   GEN: A/Ox3; pleasant , NAD, well nourished    HEENT:  [place]/AT,  NOSE-clear, THROAT-clear, no lesions, no postnasal drip or exudate noted.   NECK:  Supple w/ fair ROM; no JVD; normal carotid impulses w/o bruits; no thyromegaly or nodules palpated; no lymphadenopathy.    RESP  Clear  P & A; w/o, wheezes/ rales/ or rhonchi. no accessory muscle use, no dullness to  percussion  CARD:  RRR, no m/r/g, no peripheral edema, pulses intact, no cyanosis or clubbing.  GI:   Soft & nt; nml bowel sounds; no organomegaly or masses detected.   Musco: Warm bil, no deformities or joint swelling noted.   Neuro: alert, no focal deficits noted.    Skin: Warm, no lesions or rashes    Lab Results:  CBC  BMET  ProBNP No results found for: "PROBNP"  Imaging: CT Angio Chest PE W and/or Wo Contrast  Result Date: 06/15/2022 CLINICAL DATA:  Pulmonary embolism (PE) suspected, high prob hx of PE, hemoptysis EXAM: CT ANGIOGRAPHY CHEST WITH CONTRAST TECHNIQUE: Multidetector CT imaging of the chest was performed using the standard protocol during bolus administration of intravenous contrast. Multiplanar CT image reconstructions and MIPs were obtained to evaluate the vascular anatomy. RADIATION DOSE REDUCTION: This exam was performed according to the departmental dose-optimization program which includes automated exposure control, adjustment of the mA and/or kV according to patient size and/or use of iterative reconstruction technique. CONTRAST:  70m OMNIPAQUE IOHEXOL 350 MG/ML SOLN COMPARISON:  08/13/2020 FINDINGS: Cardiovascular: There is adequate opacification of the pulmonary arterial tree. No intraluminal filling defect identified to suggest acute pulmonary embolism. The central pulmonary arteries are mildly enlarged suggesting changes of pulmonary arterial hypertension. Moderate coronary artery calcification within the left anterior descending and ramus intermedius arteries. Cardiac size within normal limits. No pericardial effusion. Mild atherosclerotic calcification within the thoracic aorta. No aortic aneurysm. The bronchial arteries are enlarged. Left bronchial artery is not optimally delineated given the early phase of enhancement. The right bronchial artery, however, is seen arising anteriorly from the aorta at the level of the superior endplate of T6 (image # 104/6).  Mediastinum/Nodes: No enlarged mediastinal, hilar, or axillary lymph nodes. Thyroid gland, trachea, and esophagus demonstrate no significant findings. Lungs/Pleura: There is architectural distortion with cylindrical bronchiectasis, honeycombing, volume loss, and superior height hilar retraction within the lung apices bilaterally in keeping with chronic changes of pulmonary sarcoidosis. Milder changes are noted within the superior segment of the lower lobes bilaterally. These findings appear  similar in severity to prior examination. The previously noted cavitary lesion within the right upper lobe now demonstrates rounded soft tissue which is suspicious for a developing mycetoma, best seen on axial image # 30/7 and coronal image # 71/8. Additionally, there has developed extensive ground-glass and consolidative infiltrate within the superior segment of the right lower lobe. Inflammatory appearing ground-glass nodularity is seen within the right upper lobe and left lower lobe. These findings may relate to multifocal pneumonia. Alternatively, pulmonary hemorrhage with endobronchial extension/aspiration of hemorrhage could appear similarly. No pneumothorax or pleural effusion. No central obstructing lesion. Upper Abdomen: No acute abnormality. Musculoskeletal: No acute bone abnormality. No lytic or blastic bone lesion. Review of the MIP images confirms the above findings. IMPRESSION: 1. No pulmonary embolism. 2. Morphologic changes in keeping with pulmonary arterial hypertension. 3. Moderate coronary artery calcification. 4. Chronic changes of pulmonary sarcoidosis, similar in severity to prior examination. 5. Interval development of rounded soft tissue mass within the previously noted cavitary lesion within the right upper lobe suspicious for a developing mycetoma. 6. Interval development of extensive ground-glass and consolidative infiltrate within the superior segment of the right lower lobe. Inflammatory appearing  ground-glass nodularity is seen within the right upper lobe and left lower lobe. These findings may relate to multifocal pneumonia. Alternatively, pulmonary hemorrhage with endobronchial extension/aspiration of hemorrhage could appear similarly. 7. Enlarged bronchial arteries bilaterally. Left bronchial artery is not optimally delineated given the early phase of enhancement. The right bronchial artery, however, is seen arising anteriorly from the aorta at the level of the superior endplate of T6. Aortic Atherosclerosis (ICD10-I70.0). Electronically Signed   By: Fidela Salisbury M.D.   On: 06/15/2022 01:24   DG Chest Port 1 View  Result Date: 06/14/2022 CLINICAL DATA:  Shortness of breath, hemoptysis. EXAM: PORTABLE CHEST 1 VIEW COMPARISON:  CT chest dated August 13, 2020 FINDINGS: The heart size and mediastinal contours are within normal limits. Postsurgical changes about the right hilum consistent with right upper lobectomy. Volume loss of the right lung. Bilateral reticular opacities with cystic changes consistent with chronic lung disease, unchanged. Acute infectious/inflammatory process in the settings of chronic lung disease can not be excluded. IMPRESSION: Bilateral reticular opacities with cystic changes consistent with chronic lung disease, unchanged. Acute infectious/inflammatory process in the settings of chronic lung disease can not be excluded. Electronically Signed   By: Keane Police D.O.   On: 06/14/2022 22:56          No data to display          No results found for: "NITRICOXIDE"      Assessment & Plan:   Hemoptysis Recurrent hemoptysis.  Most recent episode was earlier in January 2024.  This has resolved.  This did occur after recent COVID-19 infection.  He tested positive on January 1.  Seems to be doing well since discharge.  Plan  Patient Instructions  Continue on Eliquis 2.'5mg'$  Twice daily.  CT chest in 6 weeks  Continue on CPAP At bedtime   Saline nasal spray Twice  daily   Saline nasal gel At bedtime   Follow up with Dr. Halford Chessman  in 6 weeks and As needed   Please contact office for sooner follow up if symptoms do not improve or worsen or seek emergency care       Sarcoidosis Sarcoidosis with chronic changes on CT scan.  Right upper lobe cavitary changes concerning for evolving mycetoma.  Will recheck CT chest in 6 weeks.  Continue current regimen  Plan  Patient Instructions  Continue on Eliquis 2.'5mg'$  Twice daily.  CT chest in 6 weeks  Continue on CPAP At bedtime   Saline nasal spray Twice daily   Saline nasal gel At bedtime   Follow up with Dr. Halford Chessman  in 6 weeks and As needed   Please contact office for sooner follow up if symptoms do not improve or worsen or seek emergency care       OSA (obstructive sleep apnea) Excellent control and compliance on CPAP.  History of pulmonary embolism History of PE.  Continue on Eliquis low-dose    I spent   42 minutes dedicated to the care of this patient on the date of this encounter to include pre-visit review of records, face-to-face time with the patient discussing conditions above, post visit ordering of testing, clinical documentation with the electronic health record, making appropriate referrals as documented, and communicating necessary findings to members of the patients care team.    Rexene Edison, NP 07/03/2022

## 2022-07-03 NOTE — Assessment & Plan Note (Signed)
Excellent control and compliance on CPAP.

## 2022-07-03 NOTE — Assessment & Plan Note (Signed)
Sarcoidosis with chronic changes on CT scan.  Right upper lobe cavitary changes concerning for evolving mycetoma.  Will recheck CT chest in 6 weeks.  Continue current regimen  Plan  Patient Instructions  Continue on Eliquis 2.'5mg'$  Twice daily.  CT chest in 6 weeks  Continue on CPAP At bedtime   Saline nasal spray Twice daily   Saline nasal gel At bedtime   Follow up with Dr. Halford Chessman  in 6 weeks and As needed   Please contact office for sooner follow up if symptoms do not improve or worsen or seek emergency care

## 2022-07-04 NOTE — Progress Notes (Signed)
Reviewed and agree with assessment/plan.   Chesley Mires, MD [facility] Pulmonary/Critical Care 07/04/2022, 9:50 AM Pager:  [phone]

## 2022-07-13 ENCOUNTER — Ambulatory Visit (HOSPITAL_BASED_OUTPATIENT_CLINIC_OR_DEPARTMENT_OTHER): Payer: Medicare Other | Admitting: Pulmonary Disease

## 2022-07-13 ENCOUNTER — Encounter (HOSPITAL_BASED_OUTPATIENT_CLINIC_OR_DEPARTMENT_OTHER): Payer: Self-pay

## 2022-08-15 ENCOUNTER — Ambulatory Visit (HOSPITAL_BASED_OUTPATIENT_CLINIC_OR_DEPARTMENT_OTHER)
Admission: RE | Admit: 2022-08-15 | Discharge: 2022-08-15 | Disposition: A | Discharge status: Home / Self Care | Payer: Medicare PPO | Source: Ambulatory Visit | Attending: Adult Health | Admitting: Adult Health

## 2022-08-15 ENCOUNTER — Encounter (HOSPITAL_BASED_OUTPATIENT_CLINIC_OR_DEPARTMENT_OTHER): Payer: Self-pay

## 2022-08-15 DIAGNOSIS — D869 Sarcoidosis, unspecified: Secondary | ICD-10-CM | POA: Diagnosis present

## 2022-08-15 DIAGNOSIS — J189 Pneumonia, unspecified organism: Secondary | ICD-10-CM

## 2022-08-28 ENCOUNTER — Ambulatory Visit (INDEPENDENT_AMBULATORY_CARE_PROVIDER_SITE_OTHER): Payer: Medicare PPO | Admitting: Pulmonary Disease

## 2022-08-28 ENCOUNTER — Encounter (HOSPITAL_BASED_OUTPATIENT_CLINIC_OR_DEPARTMENT_OTHER): Payer: Self-pay | Admitting: Pulmonary Disease

## 2022-08-28 VITALS — BP 118/68 | HR 71 | Temp 98.0°F | Ht 68.0 in | Wt 169.2 lb

## 2022-08-28 DIAGNOSIS — G4733 Obstructive sleep apnea (adult) (pediatric): Secondary | ICD-10-CM | POA: Diagnosis not present

## 2022-08-28 DIAGNOSIS — D869 Sarcoidosis, unspecified: Secondary | ICD-10-CM | POA: Diagnosis not present

## 2022-08-28 DIAGNOSIS — Z86711 Personal history of pulmonary embolism: Secondary | ICD-10-CM

## 2022-08-28 NOTE — Patient Instructions (Signed)
Follow up in 6 months 

## 2022-08-28 NOTE — Progress Notes (Signed)
[place] [facility], [facility] Sleep [facility]  Chief Complaint  Patient presents with   Follow-up    Breathing has improved since the last visit. He wears CPAP every night- hose making noise and not staying connected.     Constitutional:  BP 118/68 (BP Location: Left Arm, Cuff Size: Normal)   Pulse 71   Temp 98 F (36.7 C) (Oral)   Ht 5\' 8"  (1.727 m)   Wt 169 lb 3.2 oz (76.7 kg)   SpO2 98% Comment: on RA  BMI 25.73 kg/m   Past Medical History:  BPH, Cirrhosis of liver from Hep C s/p transplant 2016, Hepatocellular carcinoma, HLD, HTN, Uveitis, Osteoporosis, Diabetes mellitus, Gout, PE  Past Surgical History:  He  has a past surgical history that includes lung resection aspergilloma (2000) and Liver transplant (04-2015).  Brief Summary:  Jeffery York is a 67 y.o. male former smoker with obstructive sleep apnea.  He has hx of pulmonary sarcoidosis dx in 1990, s/p Rt upper lobectomy for aspergilloma in 2000.      Subjective:   He had COVID in January.  Had hemoptysis related to this.  CT chest from earlier this month showed clearing of consolidation in Rt upper lobe and prior ground glass opacification related to COVID infection.  Breathing better.  Not having cough or chest congestion.  Uses CPAP nightly.  He has trouble with one of the connections and machine making noises now.  Physical Exam:   Appearance - well kempt   ENMT - no sinus tenderness, no oral exudate, no LAN, Mallampati 3 airway, no stridor  Respiratory - equal breath sounds bilaterally, no wheezing or rales  CV - s1s2 regular rate and rhythm, no murmurs  Ext - no clubbing, no edema  Skin - no rashes  Psych - normal mood and affect     Pulmonary testing:  PFT 03/11/19 >> FEV1 2.18 (64%), FEV1% 68, DLCO 80% ACE 01/20/20 >> 22  Chest Imaging:  CT chest 03/08/17 >> extensive scarring, s/p RULectomy CT angio chest 01/10/20 >> small PE in LUL and RML, GGO in RLL, s/p Rt upper  lobectomy, apical scarring HRCT chest 08/14/20 >> no change in appearance for sarcoidosis CT angio chest 06/15/22 >> cylindrical BTX, honeycombing, hilar retraction, soft tissue in RUL cavity, b/l GGO CT chest 08/16/22 >> GGO cleared, resolution of consolidation in RUL cavity  Sleep Tests:  HST 01/11/18 >> AHI 19.9, SaO2 low 78% Auto CPAP 07/26/22 to 08/24/22 >> used on 30 of 30 nights with average 7 hrs 18 min.  Average AHI 2.3 with median CPAP 8 and 95 th percentile CPAP 11 cm H2O  Social History:  He  reports that he quit smoking about 34 years ago. His smoking use included cigarettes. He has a 10.00 pack-year smoking history. He has never used smokeless tobacco. He reports that he does not drink alcohol and does not use drugs.  Family History:  His family history includes CAD in his brother; Diabetes in an other family member; Lung cancer in his father; Pancreatic cancer in his brother; Prostate cancer in his brother.     Assessment/Plan:   History of sarcoidosis with bronchiectasis. - recovered from COVID infection without after effects  Unprovoked pulmonary embolism from August 2021. - gets eliquis from his PCP  Obstructive sleep apnea. - he is compliant with CPAP and reports benefit - uses [employer] for his DME - his current CPAP was ordered August 2019 - continue auto CPAP 5 to 10  cm H2O  S/p liver transplant for hx of hepatitis C with cirrhosis. - followed at [facility] [place]  Time Spent Involved in Patient Care on Day of Examination:  27 minutes  Follow up:   Patient Instructions  Follow up in 6 months  Medication List:   Allergies as of 08/28/2022       Reactions   Benazepril Hcl Swelling   angioedema   Amlodipine Besy-benazepril Hcl Other (See Comments)   Erythromycin Ethylsuccinate    REACTION: GI Intolerance/vomiting   Keflex [cephalexin] Diarrhea   With cramps and Nausea   Terazosin Swelling, Other (See Comments)   Other reaction(s): Hypotension         Medication List        Accurate as of August 28, 2022  1:30 PM. If you have any questions, ask your nurse or doctor.          allopurinol 300 MG tablet Commonly known as: ZYLOPRIM Take 300 mg by mouth daily.   amLODipine 10 MG tablet Commonly known as: NORVASC Take 1 tablet (10 mg total) by mouth daily.   apixaban 2.5 MG Tabs tablet Commonly known as: ELIQUIS Take 2 tablets (5 mg total) by mouth 2 (two) times daily.   ascorbic acid 500 MG tablet Commonly known as: VITAMIN C Take 500 mg by mouth daily.   atorvastatin 10 MG tablet Commonly known as: LIPITOR Take 1 tablet (10 mg total) by mouth every other day.   cetirizine 10 MG tablet Commonly known as: ZYRTEC Take 10 mg by mouth daily.   cholecalciferol 1000 units tablet Commonly known as: VITAMIN D Take 1,000 Units by mouth daily.   CoQ10 100 MG Caps Take 100 mg by mouth daily.   fluticasone 50 MCG/ACT nasal spray Commonly known as: FLONASE Place 2 sprays into both nostrils daily as needed for allergies.   glipiZIDE 5 MG tablet Commonly known as: GLUCOTROL Take 2.5 mg by mouth daily before breakfast.   hydrochlorothiazide 25 MG tablet Commonly known as: HYDRODIURIL Take 25 mg by mouth daily.   insulin glargine 100 UNIT/ML injection Commonly known as: LANTUS Inject 10 Units into the skin daily at 2 PM.   Jardiance 25 MG Tabs tablet Generic drug: empagliflozin Take 12.5 mg by mouth daily.   magnesium oxide 400 MG tablet Commonly known as: MAG-OX Take 1 tablet by mouth 2 (two) times daily.   tacrolimus 1 MG capsule Commonly known as: PROGRAF Take 2-3 mg by mouth See admin instructions. Take 3 capsules every morning and take 2 capsules every evening        Signature:  Chesley Mires, MD [place] Pager - [phone] 08/28/2022, 1:30 PM

## 2022-09-18 ENCOUNTER — Encounter: Payer: Self-pay | Admitting: *Deleted

## 2023-02-06 ENCOUNTER — Ambulatory Visit (HOSPITAL_BASED_OUTPATIENT_CLINIC_OR_DEPARTMENT_OTHER): Payer: Medicare PPO | Admitting: Pulmonary Disease

## 2023-03-07 ENCOUNTER — Ambulatory Visit: Payer: Medicare PPO | Admitting: Adult Health

## 2023-03-07 ENCOUNTER — Encounter: Payer: Self-pay | Admitting: Adult Health

## 2023-03-07 VITALS — BP 130/60 | HR 65 | Temp 98.3°F | Ht 68.0 in | Wt 163.8 lb

## 2023-03-07 DIAGNOSIS — Z944 Liver transplant status: Secondary | ICD-10-CM

## 2023-03-07 DIAGNOSIS — G4733 Obstructive sleep apnea (adult) (pediatric): Secondary | ICD-10-CM | POA: Diagnosis not present

## 2023-03-07 DIAGNOSIS — Z23 Encounter for immunization: Secondary | ICD-10-CM

## 2023-03-07 DIAGNOSIS — D869 Sarcoidosis, unspecified: Secondary | ICD-10-CM

## 2023-03-07 DIAGNOSIS — Z86711 Personal history of pulmonary embolism: Secondary | ICD-10-CM | POA: Diagnosis not present

## 2023-03-07 NOTE — Patient Instructions (Addendum)
Continue on Eliquis 2.5mg  Twice daily.  Order for new CPAP machine Continue on CPAP At bedtime   Saline nasal spray Twice daily   Saline nasal gel At bedtime   Flu shot today .  Follow up with Dr Wynona Neat Clent Ridges NP  in 6 months with PFT-Spirometry with DLCO  and As needed  (30 min )  Please contact office for sooner follow up if symptoms do not improve or worsen or seek emergency care

## 2023-03-07 NOTE — Assessment & Plan Note (Addendum)
Pulmonary sarcoidosis with extensive pulmonary scarring .  Appears to be clinically stable.  Patient remains very active.  O2 saturations are adequate on room air.  Will check PFTs on return.  Plan  Patient Instructions  Continue on Eliquis 2.5mg  Twice daily.  Order for new CPAP machine Continue on CPAP At bedtime   Saline nasal spray Twice daily   Saline nasal gel At bedtime   Flu shot today .  Follow up with Dr Wynona Neat Clent Ridges NP  in 6 months with PFT-Spirometry with DLCO  and As needed  (30 min )  Please contact office for sooner follow up if symptoms do not improve or worsen or seek emergency care

## 2023-03-07 NOTE — Progress Notes (Signed)
@Patient  ID: Jeffery York, male    DOB: Apr 06, 1956, 67 y.o.   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 Medical history significant for cirrhosis secondary to hepatitis C status post liver transplant ([facility]-04/2015), uveitis, diabetes and history of Unprovoked PE   TEST/EVENTS :  CT chest 03/08/17 >> extensive scarring, s/p RULectomy CT angio chest 01/10/20 >> small PE in LUL and RML, GGO in RLL, s/p Rt upper lobectomy, apical scarring HRCT chest 08/14/20 >> no change in appearance for sarcoidosis ((widespread cylinidrical/cystic bronchiectasis , architectural distortion/volume loss/honeycombing ) CT angio chest 06/15/22 >> cylindrical BTX, honeycombing, hilar retraction, soft tissue in RUL cavity, b/l GGO  CT chest 08/16/22 >> GGO cleared, resolution of consolidation in RUL cavity    Sleep Tests:  HST 01/11/18 >> AHI 19.9, SaO2 low 78% Auto CPAP 11/01/21 to 01/29/22 >> used on 88 of 90 nights with average 6 hrs 47 min.  Average AHI 2.2 with median CPAP 7 and 95 th percentile CPAP 11 cm H2O  PFT 03/11/19 >> FEV1 2.18 (64%), FEV1% 68, DLCO 80% ACE 01/20/20 >> 22  03/07/2023 Follow up : Sarcoid and OSA, Hx of PE  Patient returns for his 92-month follow-up.  Patient has underlying pulmonary sarcoidosis with extensive scarring , history of aspergilloma with right upper lobectomy in 2000.  Patient had been admitted in January 2024 for hemoptysis  in the setting of recent COVID-19 infection.  CT chest showed soft tissue density in the right upper lobe cavity concerning for possible mycetoma.  Eliquis was held and hemoptysis resolved. Patient clinically improved and follow-up CT chest March 2024 showed groundglass opacities cleared and resolution of consolidation in the  right upper lobe cavity. Serial imaging shows stable scarring .   Patient has a history of PE remains on Eliquis 2.5 mg twice daily. Overall since last visit patient says his breathing's been doing okay.  No increased cough or congestion. No hemoptysis  Very active, plays pickleball several times a week.   As above patient has a history of cirrhosis/hepatitis C status post liver transplant.  He is immunosuppressed.  Patient has a history of sleep apnea.  Remains on CPAP.  Patient says he is doing well on CPAP.  Says he wears a CPAP every single night.  Feels that he benefits from CPAP.  With decreased daytime sleepiness.  He says he cannot sleep without it.  Patient says his machine is getting old and needs a new machine.  CPAP download shows excellent compliance with daily average usage at 7 hours.  Patient is on auto CPAP 5 to 15 cm H2O.  AHI is 1.8/hour. Uses dream wear nasal mask.   Allergies  Allergen Reactions   Benazepril Hcl Swelling    angioedema   Amlodipine Besy-Benazepril Hcl Other (See Comments)   Erythromycin Ethylsuccinate     REACTION: GI Intolerance/vomiting   Keflex [Cephalexin] Diarrhea    With cramps and Nausea   Terazosin Swelling and Other (See Comments)    Other reaction(s): Hypotension    Immunization History  Administered Date(s) Administered   Fluad Quad(high Dose 65+) 03/03/2021, 03/06/2022   Fluad Trivalent(High Dose 65+) 03/07/2023   H1N1 06/23/2008   Hep A / Hep B 05/26/2013   Influenza Split 04/16/2007, 03/09/2009, 04/05/2010, 05/05/2011   Influenza Whole  04/16/2007, 03/09/2009, 04/05/2010   Influenza, High Dose Seasonal PF 03/03/2021   Influenza, Quadrivalent, Recombinant, Inj, Pf 04/14/2019   Influenza, Seasonal, Injecte, Preservative Fre 04/30/2013   Influenza,inj,Quad PF,6+ Mos 04/06/2016, 04/03/2017, 03/25/2018, 02/16/2020   Influenza-Unspecified 05/05/2011, 06/04/2014, 02/04/2015, 03/10/2015, 02/04/2016, 03/03/2021   Moderna Covid-19 Vaccine  Bivalent Booster 68yrs & up 03/03/2021   Moderna Sars-Covid-2 Vaccination 06/25/2019, 07/22/2019, 01/21/2020, 09/01/2020   Pfizer Covid-19 Vaccine Bivalent Booster 64yrs & up 07/08/2021   Pfizer(Comirnaty)Fall Seasonal Vaccine 12 years and older 03/06/2022   Pneumococcal Conjugate-13 07/23/2014   Pneumococcal Polysaccharide-23 03/05/2006, 05/21/2013, 01/15/2020   Respiratory Syncytial Virus Vaccine,Recomb Aduvanted(Arexvy) 04/21/2022   Td 06/05/2002   Tdap 05/21/2013   Zoster Recombinant(Shingrix) 01/14/2021, 07/08/2021   Zoster, Live 11/04/2014    Past Medical History:  Diagnosis Date   Benign prostatic hypertrophy    Cirrhosis (HCC)    Diabetes mellitus    initially 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 but then the diabetes resurface without the use of steroids   HCC (hepatocellular carcinoma) (HCC)    s/p TACE and microwave ablation   Hep C w/o coma, chronic (HCC)    bx aprox 12-10, s/p Harvoni, on a transplant list candidate    History of sarcoidosis    used steroids x 10 years   Hyperlipidemia    Hypertension    Liver transplant recipient (HCC) 2016   OSA (obstructive sleep apnea) 01/15/2018   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 90 tablet 1   apixaban (ELIQUIS) 2.5 MG TABS tablet Take 2 tablets (5 mg total) by mouth 2 (two) times daily. 60 tablet    ascorbic acid (VITAMIN C) 500 MG tablet Take 500 mg by mouth daily.     atorvastatin  (LIPITOR) 10 MG tablet Take 1 tablet (10 mg total) by mouth every other day.     cetirizine (ZYRTEC) 10 MG tablet Take 10 mg by mouth daily.     cholecalciferol (VITAMIN D) 1000 units tablet Take 1,000 Units by mouth daily.      Coenzyme Q10 (COQ10) 100 MG CAPS Take 100 mg by mouth daily.     fluticasone (FLONASE) 50 MCG/ACT nasal spray Place 2 sprays into both nostrils daily as needed for allergies.      glipiZIDE (GLUCOTROL) 5 MG tablet Take 2.5 mg by mouth daily before breakfast.     hydrochlorothiazide (HYDRODIURIL) 25 MG tablet Take 25 mg by mouth daily.     insulin glargine (LANTUS) 100 UNIT/ML injection Inject 10 Units into the skin daily at 2 PM.     JARDIANCE 25 MG TABS tablet Take 12.5 mg by mouth daily.     magnesium oxide (MAG-OX) 400 MG tablet Take 1 tablet by mouth 2 (two) times daily.     tacrolimus (PROGRAF) 1 MG capsule Take 2-3 mg by mouth See admin instructions. Take 2capsules every morning and take 2 capsules every evening  allopurinol (ZYLOPRIM) 300 MG tablet Take 300 mg by mouth daily.      No facility-administered medications prior to visit.     Review of Systems:   Constitutional:   No  weight loss, night sweats,  Fevers, chills,  +fatigue, or  lassitude.  HEENT:   No headaches,  Difficulty swallowing,  Tooth/dental problems, or  Sore throat,                No sneezing, itching, ear ache, nasal congestion, post nasal drip,   CV:  No chest pain,  Orthopnea, PND, swelling in lower extremities, anasarca, dizziness, palpitations, syncope.   GI  No heartburn, indigestion, abdominal pain, nausea, vomiting, diarrhea, change in bowel habits, loss of appetite, bloody stools.   Resp: No shortness of breath with exertion or at rest.  No excess mucus, no productive cough,  No non-productive cough,  No coughing up of blood.  No change in color of mucus.  No wheezing.  No chest wall deformity  Skin: no rash or lesions.  GU: no dysuria, change in color of urine, no  urgency or frequency.  No flank pain, no hematuria   MS:  +joint stiffness    Physical Exam  BP 130/60 (BP Location: Left Arm, Cuff Size: Normal)   Pulse 65   Temp 98.3 F (36.8 C) (Temporal)   Ht 5\' 8"  (1.727 m)   Wt 163 lb 12.8 oz (74.3 kg)   SpO2 97%   BMI 24.91 kg/m   GEN: A/Ox3; pleasant , NAD, well nourished    HEENT:  [place]/AT,  NOSE-clear, THROAT-clear, no lesions, no postnasal drip or exudate noted.   NECK:  Supple w/ fair ROM; no JVD; normal carotid impulses w/o bruits; no thyromegaly or nodules palpated; no lymphadenopathy.    RESP  Clear  P & A; w/o, wheezes/ rales/ or rhonchi. no accessory muscle use, no dullness to percussion  CARD:  RRR, no m/r/g, no peripheral edema, pulses intact, no cyanosis or clubbing.  GI:   Soft & nt; nml bowel sounds; no organomegaly or masses detected.   Musco: Warm bil, no deformities or joint swelling noted.   Neuro: alert, no focal deficits noted.    Skin: Warm, no lesions or rashes    Lab Results:  CBC    ProBNP No results found for: "PROBNP"  Imaging: No results found.  Administration History     None           No data to display          No results found for: "NITRICOXIDE"      Assessment & Plan:   OSA (obstructive sleep apnea) Excellent control and compliance on nocturnal CPAP.  No changes in setting.  Order for new CPAP machine has been sent to DME.  Plan  Patient Instructions  Continue on Eliquis 2.5mg  Twice daily.  Order for new CPAP machine Continue on CPAP At bedtime   Saline nasal spray Twice daily   Saline nasal gel At bedtime   Flu shot today .  Follow up with Dr Wynona Neat Clent Ridges NP  in 6 months with PFT-Spirometry with DLCO  and As needed  (30 min )  Please contact office for sooner follow up if symptoms do not improve or worsen or seek emergency care      History of pulmonary embolism History of unprovoked PE on low dose eliquis   Sarcoidosis Pulmonary sarcoidosis with  extensive pulmonary scarring .  Appears to be clinically stable.  Patient remains very active.  O2 saturations are adequate on room air.  Will check PFTs on return.  Plan  Patient Instructions  Continue on Eliquis 2.5mg  Twice daily.  Order for new CPAP machine Continue on CPAP At bedtime   Saline nasal spray Twice daily   Saline nasal gel At bedtime   Flu shot today .  Follow up with Dr Wynona Neat Clent Ridges NP  in 6 months with PFT-Spirometry with DLCO  and As needed  (30 min )  Please contact office for sooner follow up if symptoms do not improve or worsen or seek emergency care      Liver transplant recipient [facility]) Continue follow-up with transplant team.  Remains on immunosuppression.     Rubye Oaks, NP 03/07/2023

## 2023-03-07 NOTE — Assessment & Plan Note (Signed)
History of unprovoked PE on low dose eliquis

## 2023-03-07 NOTE — Assessment & Plan Note (Addendum)
Excellent control and compliance on nocturnal CPAP.  No changes in setting.  Order for new CPAP machine has been sent to DME.  Plan  Patient Instructions  Continue on Eliquis 2.5mg  Twice daily.  Order for new CPAP machine Continue on CPAP At bedtime   Saline nasal spray Twice daily   Saline nasal gel At bedtime   Flu shot today .  Follow up with Dr Wynona Neat Clent Ridges NP  in 6 months with PFT-Spirometry with DLCO  and As needed  (30 min )  Please contact office for sooner follow up if symptoms do not improve or worsen or seek emergency care

## 2023-03-07 NOTE — Assessment & Plan Note (Signed)
Continue follow-up with transplant team.  Remains on immunosuppression.

## 2023-03-27 ENCOUNTER — Telehealth: Payer: Self-pay | Admitting: Adult Health

## 2023-03-27 DIAGNOSIS — G4733 Obstructive sleep apnea (adult) (pediatric): Secondary | ICD-10-CM

## 2023-03-27 NOTE — Telephone Encounter (Signed)
Marasol from [employer] is calling in regards to a new CPAP for this patient. The Numerical pressure setting is missing on the prescription.

## 2023-03-28 NOTE — Telephone Encounter (Signed)
I placed a new order with actual settings

## 2023-05-21 ENCOUNTER — Encounter: Payer: Self-pay | Admitting: *Deleted

## 2024-03-07 ENCOUNTER — Encounter: Payer: Self-pay | Admitting: Adult Health

## 2024-03-07 ENCOUNTER — Ambulatory Visit (INDEPENDENT_AMBULATORY_CARE_PROVIDER_SITE_OTHER): Admitting: Adult Health

## 2024-03-07 ENCOUNTER — Ambulatory Visit

## 2024-03-07 VITALS — BP 137/76 | HR 60 | Temp 98.0°F | Ht 68.0 in | Wt 158.0 lb

## 2024-03-07 DIAGNOSIS — D869 Sarcoidosis, unspecified: Secondary | ICD-10-CM

## 2024-03-07 DIAGNOSIS — G4733 Obstructive sleep apnea (adult) (pediatric): Secondary | ICD-10-CM

## 2024-03-07 DIAGNOSIS — Z944 Liver transplant status: Secondary | ICD-10-CM

## 2024-03-07 DIAGNOSIS — Z23 Encounter for immunization: Secondary | ICD-10-CM

## 2024-03-07 DIAGNOSIS — Z86711 Personal history of pulmonary embolism: Secondary | ICD-10-CM | POA: Diagnosis not present

## 2024-03-07 NOTE — Progress Notes (Signed)
 @Patient  ID: Jeffery York, male    DOB: [birth year], 68 y.o.   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 C status post liver transplant ([facility] November 2016), uveitis, diabetes, history of unprovoked PE on anticoagulation therapy Patient is a veteran followed at the [facility]    TEST/EVENTS :  CT chest 03/08/17 >> extensive scarring, s/p RULectomy CT angio chest 01/10/20 >> small PE in LUL and RML, GGO in RLL, s/p Rt upper lobectomy, apical scarring HRCT chest 08/14/20 >> no change in appearance for sarcoidosis ((widespread cylinidrical/cystic bronchiectasis , architectural distortion/volume loss/honeycombing ) CT angio chest 06/15/22 >> cylindrical BTX, honeycombing, hilar retraction, soft tissue in RUL cavity, b/l GGO  CT chest 08/16/22 >> GGO cleared, resolution of consolidation in RUL cavity    Sleep Tests:  HST 01/11/18 >> AHI 19.9, SaO2 low 78% Auto CPAP 11/01/21 to 01/29/22 >> used on 88 of 90 nights with average 6 hrs 47 min.  Average AHI 2.2 with median CPAP 7 and 95 th percentile CPAP 11 cm H2O   PFT 03/11/19 >> FEV1 2.18 (64%), FEV1% 68, DLCO 80% Spirometry FEV1 64%, ratio 78, FVC 74% ACE 01/20/20 >> 22  Discussed the use of AI scribe software for clinical note transcription with the patient, who gave verbal consent to proceed.  History of Present Illness Jeffery York is a 68 year old male with sleep apnea who presents for a follow-up on CPAP therapy.  He recently acquired a new CPAP machine, the Airsense 11, which is functioning well. He uses a nasal Dreamwear mask and achieves an average of seven and a half hours of usage per night. Occasional  mask leaks occur, particularly when not sleeping on his side, but they do not disturb his sleep. An issue with a hose while traveling was resolved by returning an incompatible hose ordered online.  Patient says he is doing very well and feels that he benefits from CPAP.  Download shows excellent compliance with daily average usage at 7.5 hours.  He is on auto CPAP 5 to 15 cm H2O.  AHI 1.3/hour daily average pressure 12.4 cm H2O.  He continues to take Eliquis  2.5 mg twice daily, prescribed by the [facility], with no reported issues.  Patient education given on anticoagulation therapy.  Has a previous history of unprovoked PE on lifelong anticoagulation therapy   Has a history of sarcoidosis but denies any recent coughing or respiratory issues.  CT chest August 15, 2022 showed resolved bilateral ground glass opacities and resolved consolidation with chronic cavitation of the right lower lobe, stable findings of fibrotic sarcoidosis He remains active, playing pickleball regularly without experiencing fatigue.  Does weight training.  Denies any shortness of breath.  He has a history of liver transplant and is followed  at [facility]      Allergies  Allergen Reactions   Benazepril  Hcl Swelling    angioedema   Amlodipine  Besy-Benazepril  Hcl Other (See Comments)   Erythromycin Ethylsuccinate     REACTION: GI Intolerance/vomiting   Keflex [Cephalexin] Diarrhea    With cramps and Nausea   Terazosin Swelling and Other (See Comments)    Other reaction(s): Hypotension    Immunization History  Administered  Date(s) Administered   Fluad Quad(high Dose 65+) 03/03/2021, 03/06/2022   Fluad Trivalent(High Dose 65+) 03/07/2023   H1N1 06/23/2008   Hep A / Hep B 05/26/2013   INFLUENZA, HIGH DOSE SEASONAL PF 03/03/2021   Influenza Split 04/16/2007, 03/09/2009, 04/05/2010, 05/05/2011   Influenza Whole 04/16/2007, 03/09/2009, 04/05/2010   Influenza, Quadrivalent, Recombinant, Inj, Pf 04/14/2019   Influenza, Seasonal,  Injecte, Preservative Fre 04/30/2013   Influenza,inj,Quad PF,6+ Mos 04/06/2016, 04/03/2017, 03/25/2018, 02/16/2020   Influenza-Unspecified 05/05/2011, 06/04/2014, 02/04/2015, 03/10/2015, 02/04/2016, 03/03/2021   Moderna Covid-19 Vaccine Bivalent Booster 39yrs & up 03/03/2021   Moderna Sars-Covid-2 Vaccination 06/25/2019, 07/22/2019, 01/21/2020, 09/01/2020   Pfizer Covid-19 Vaccine Bivalent Booster 36yrs & up 07/08/2021   Pfizer(Comirnaty)Fall Seasonal Vaccine 12 years and older 03/06/2022   Pneumococcal Conjugate-13 07/23/2014   Pneumococcal Polysaccharide-23 03/05/2006, 05/21/2013, 01/15/2020   Respiratory Syncytial Virus Vaccine,Recomb Aduvanted(Arexvy) 04/21/2022   Td 06/05/2002   Tdap 05/21/2013   Zoster Recombinant(Shingrix) 01/14/2021, 07/08/2021   Zoster, Live 11/04/2014    Past Medical History:  Diagnosis Date   Benign prostatic hypertrophy    Cirrhosis (HCC)    Diabetes mellitus    initially 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 but then the diabetes resurface without the use of steroids   HCC (hepatocellular carcinoma) (HCC)    s/p TACE and microwave ablation   Hep C w/o coma, chronic (HCC)    bx aprox 12-10, s/p Harvoni, on a transplant list candidate    History of sarcoidosis    used steroids x 10 years   Hyperlipidemia    Hypertension    Liver transplant recipient (HCC) 2016   OSA (obstructive sleep apnea) 01/15/2018   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 90 tablet 1   apixaban  (ELIQUIS ) 2.5 MG TABS tablet Take 2 tablets (5 mg total) by mouth 2 (two) times daily. 60 tablet    atorvastatin  (LIPITOR) 10 MG tablet Take 1 tablet (10 mg total) by mouth every other day.     cetirizine (ZYRTEC) 10 MG tablet Take 10 mg by mouth daily.     cholecalciferol  (VITAMIN D ) 1000 units tablet Take 1,000 Units by mouth daily.      Coenzyme Q10 (COQ10) 100 MG CAPS Take 100 mg by mouth daily.     fluticasone  (FLONASE ) 50 MCG/ACT nasal spray Place 2 sprays into both nostrils daily as needed for allergies.      glipiZIDE (GLUCOTROL) 5 MG tablet Take 2.5 mg by mouth daily before breakfast.     hydrochlorothiazide (HYDRODIURIL) 25 MG tablet Take 25 mg by mouth daily.     insulin  glargine (LANTUS ) 100 UNIT/ML injection Inject 10 Units into the skin daily at 2 PM.     JARDIANCE 25 MG TABS tablet Take 12.5 mg by mouth daily.     magnesium oxide (MAG-OX) 400 MG tablet Take 1 tablet by mouth 2 (two) times daily.  tacrolimus  (PROGRAF ) 1 MG capsule Take 2-3 mg by mouth See admin instructions. Take 2capsules every morning and take 2 capsules every evening     allopurinol  (ZYLOPRIM ) 300 MG tablet Take 300 mg by mouth daily.      ascorbic acid  (VITAMIN C) 500 MG tablet Take 500 mg by mouth daily.     No facility-administered medications prior to visit.     Review of Systems:   Constitutional:   No  weight loss, night sweats,  Fevers, chills, fatigue, or  lassitude.  HEENT:   No headaches,  Difficulty swallowing,  Tooth/dental problems, or  Sore throat,                No sneezing, itching, ear ache, nasal congestion, post nasal drip,   CV:  No chest pain,  Orthopnea, PND, swelling in lower extremities, anasarca, dizziness, palpitations, syncope.   GI  No heartburn, indigestion, abdominal pain, nausea, vomiting, diarrhea, change in bowel habits, loss of appetite, bloody stools.   Resp: No shortness of breath with exertion or at rest.  No excess  mucus, no productive cough,  No non-productive cough,  No coughing up of blood.  No change in color of mucus.  No wheezing.  No chest wall deformity  Skin: no rash or lesions.  GU: no dysuria, change in color of urine, no urgency or frequency.  No flank pain, no hematuria   MS:  No joint pain or swelling.  No decreased range of motion.  No back pain.    Physical Exam  BP 137/76   Pulse 60   Temp 98 F (36.7 C) (Oral)   Ht 5' 8 (1.727 m)   Wt 158 lb (71.7 kg)   SpO2 97%   BMI 24.02 kg/m   GEN: A/Ox3; pleasant , NAD, well nourished    HEENT:  [place]/AT,  , NOSE-clear, THROAT-clear, no lesions, no postnasal drip or exudate noted.   NECK:  Supple w/ fair ROM; no JVD; normal carotid impulses w/o bruits; no thyromegaly or nodules palpated; no lymphadenopathy.    RESP  Clear  P & A; w/o, wheezes/ rales/ or rhonchi. no accessory muscle use, no dullness to percussion  CARD:  RRR, no m/r/g, no peripheral edema, pulses intact, no cyanosis or clubbing.  GI:   Soft & nt; nml bowel sounds; no organomegaly or masses detected.   Musco: Warm bil, no deformities or joint swelling noted.   Neuro: alert, no focal deficits noted.    Skin: Warm, no lesions or rashes    Lab Results:      ProBNP No results found for: PROBNP  Imaging: No results found.  Administration History     None           No data to display          No results found for: NITRICOXIDE      Assessment & Plan:   Assessment and Plan Assessment & Plan Obstructive sleep apnea   Obstructive sleep apnea is well-managed with CPAP therapy. He complies well with the new CPAP machine, using it for 7.5 hours per night. The AHI is below 5, indicating effective treatment. Occasional mask leaks do not affect sleep quality or apnea control. He uses a nasal Dreamwear mask and understands the importance of using distilled water in the CPAP machine to prevent contamination and equipment damage. Continue  current CPAP therapy with the nasal Dreamwear mask.  CPAP care discussed in detail  Pulmonary sarcoidosis   Pulmonary sarcoidosis  is stable with no symptoms of coughing or respiratory distress. He remains active, playing pickleball regularly without fatigue or dyspnea. Oxygen levels are normal, Order a chest x-ray today to monitor lung status. Schedule a follow-up breathing test at the next visit in six months.  History of PE continue on anticoagulation therapy with Eliquis  2.5 mg twice daily patient education given  History of liver transplant on chronic immunosuppression continue follow-up with transplant team.   General Health Maintenance    Administer flu shot today.   Plan  Patient Instructions  Chest xray today.  Continue on Eliquis  2.5mg  Twice daily.  Continue on CPAP At bedtime   Saline nasal spray Twice daily   Saline nasal gel At bedtime   Flu shot today .  Follow up with Dr Neda Dory NP  in 6 months with PFT-Spirometry with DLCO  and As needed  (30 min )         Madelin Stank, NP 03/07/2024

## 2024-03-07 NOTE — Patient Instructions (Addendum)
 Chest xray today.  Continue on Eliquis  2.5mg  Twice daily.  Continue on CPAP At bedtime   Saline nasal spray Twice daily   Saline nasal gel At bedtime   Flu shot today .  Follow up with Dr Neda Dory NP  in 6 months with PFT-Spirometry with DLCO  and As needed  (30 min )

## 2024-03-13 ENCOUNTER — Ambulatory Visit: Payer: Self-pay | Admitting: Adult Health

## 2024-07-02 ENCOUNTER — Other Ambulatory Visit: Payer: Self-pay | Admitting: Urology

## 2024-07-21 ENCOUNTER — Encounter (HOSPITAL_COMMUNITY)

## 2024-07-28 ENCOUNTER — Ambulatory Visit (HOSPITAL_COMMUNITY): Admit: 2024-07-28 | Admitting: Urology
